# Patient Record
Sex: Female | Born: 1935
Health system: Southern US, Community
[De-identification: ages and names within clinical notes are randomized; demographics above are authoritative.]

## PROBLEM LIST (undated history)

## (undated) DIAGNOSIS — I4510 Unspecified right bundle-branch block: Secondary | ICD-10-CM

## (undated) DIAGNOSIS — R42 Dizziness and giddiness: Secondary | ICD-10-CM

## (undated) DIAGNOSIS — R7989 Other specified abnormal findings of blood chemistry: Secondary | ICD-10-CM

## (undated) DIAGNOSIS — E860 Dehydration: Secondary | ICD-10-CM

## (undated) DIAGNOSIS — I10 Essential (primary) hypertension: Secondary | ICD-10-CM

## (undated) DIAGNOSIS — H409 Unspecified glaucoma: Secondary | ICD-10-CM

## (undated) DIAGNOSIS — E785 Hyperlipidemia, unspecified: Secondary | ICD-10-CM

## (undated) DIAGNOSIS — F338 Other recurrent depressive disorders: Secondary | ICD-10-CM

## (undated) DIAGNOSIS — I2699 Other pulmonary embolism without acute cor pulmonale: Secondary | ICD-10-CM

## (undated) HISTORY — DX: Unspecified right bundle-branch block: I45.10

## (undated) HISTORY — DX: Dehydration: E86.0

## (undated) HISTORY — PX: CATARACT EXTRACTION: SUR2

## (undated) HISTORY — DX: Other recurrent depressive disorders: F33.8

## (undated) HISTORY — DX: Essential (primary) hypertension: I10

## (undated) HISTORY — DX: Dizziness and giddiness: R42

## (undated) HISTORY — PX: ABDOMINAL HYSTERECTOMY: SHX81

## (undated) HISTORY — DX: Other specified abnormal findings of blood chemistry: R79.89

## (undated) HISTORY — DX: Unspecified glaucoma: H40.9

## (undated) HISTORY — DX: Hyperlipidemia, unspecified: E78.5

---

## 1997-09-12 ENCOUNTER — Other Ambulatory Visit: Admission: RE | Admit: 1997-09-12 | Discharge: 1997-09-12 | Payer: Self-pay | Admitting: Gynecology

## 1998-09-07 ENCOUNTER — Other Ambulatory Visit: Admission: RE | Admit: 1998-09-07 | Discharge: 1998-09-07 | Payer: Self-pay | Admitting: Internal Medicine

## 1999-09-11 ENCOUNTER — Encounter: Admission: RE | Admit: 1999-09-11 | Discharge: 1999-09-11 | Payer: Self-pay | Admitting: Internal Medicine

## 1999-09-11 ENCOUNTER — Encounter: Payer: Self-pay | Admitting: Internal Medicine

## 2000-09-11 ENCOUNTER — Encounter: Admission: RE | Admit: 2000-09-11 | Discharge: 2000-09-11 | Payer: Self-pay | Admitting: Internal Medicine

## 2000-09-11 ENCOUNTER — Encounter: Payer: Self-pay | Admitting: Internal Medicine

## 2001-09-09 ENCOUNTER — Other Ambulatory Visit: Admission: RE | Admit: 2001-09-09 | Discharge: 2001-09-09 | Payer: Self-pay | Admitting: Internal Medicine

## 2001-09-14 ENCOUNTER — Encounter: Admission: RE | Admit: 2001-09-14 | Discharge: 2001-09-14 | Payer: Self-pay | Admitting: Internal Medicine

## 2001-09-14 ENCOUNTER — Encounter: Payer: Self-pay | Admitting: Internal Medicine

## 2002-09-15 ENCOUNTER — Encounter: Payer: Self-pay | Admitting: Internal Medicine

## 2002-09-15 ENCOUNTER — Encounter: Admission: RE | Admit: 2002-09-15 | Discharge: 2002-09-15 | Payer: Self-pay | Admitting: Internal Medicine

## 2003-06-27 ENCOUNTER — Ambulatory Visit (HOSPITAL_COMMUNITY): Admission: RE | Admit: 2003-06-27 | Discharge: 2003-06-27 | Payer: Self-pay | Admitting: Gastroenterology

## 2003-09-27 ENCOUNTER — Encounter: Admission: RE | Admit: 2003-09-27 | Discharge: 2003-09-27 | Payer: Self-pay | Admitting: Internal Medicine

## 2004-09-27 ENCOUNTER — Encounter: Admission: RE | Admit: 2004-09-27 | Discharge: 2004-09-27 | Payer: Self-pay | Admitting: Internal Medicine

## 2005-10-03 ENCOUNTER — Encounter: Admission: RE | Admit: 2005-10-03 | Discharge: 2005-10-03 | Payer: Self-pay | Admitting: Internal Medicine

## 2006-10-06 ENCOUNTER — Encounter: Admission: RE | Admit: 2006-10-06 | Discharge: 2006-10-06 | Payer: Self-pay | Admitting: *Deleted

## 2007-10-13 ENCOUNTER — Encounter: Admission: RE | Admit: 2007-10-13 | Discharge: 2007-10-13 | Payer: Self-pay | Admitting: Family Medicine

## 2008-10-25 ENCOUNTER — Encounter: Admission: RE | Admit: 2008-10-25 | Discharge: 2008-10-25 | Payer: Self-pay | Admitting: Family Medicine

## 2010-08-03 NOTE — Op Note (Signed)
NAME:  April Douglas, April Douglas                           ACCOUNT NO.:  [id number]   MEDICAL RECORD NO.:  [id number]                   PATIENT TYPE:  AMB   LOCATION:  ENDO                                 FACILITY:  [facility]   PHYSICIAN:  Danise Edge, M.D.                DATE OF BIRTH:  [birth year]   DATE OF PROCEDURE:  06/27/2003  DATE OF DISCHARGE:                                 OPERATIVE REPORT   PROCEDURE PERFORMED:  Screening colonoscopy.   PROCEDURE INDICATION:  Ms. Elanie Hammitt is a 77 year old female born  [birth year].  Ms. Lingelbach is scheduled to undergo her first screening  colonoscopy with polypectomy to prevent colon cancer.  Her health  maintenance flexible proctosigmoidoscopy performed in 1999 was normal.   ENDOSCOPIST:  Danise Edge, M.D.   PREMEDICATION:  1. Versed 7 mg.  2. Demerol 70 mg.   PROCEDURE:  After obtaining informed consent, Ms. Sibrian was placed in the  left lateral decubitus position.  I administered intravenous Demerol and  intravenous Versed to achieve conscious sedation for the procedure.  The  patient's blood pressure, oxygen saturation and cardiac rhythm were  monitored throughout the procedure and documented in the medical record.   Anal inspection and digital rectal exam were normal.  The Olympus adjustable  pediatric colonoscope was introduced into the rectum and advanced to the  cecum.  Colonic preparation for the exam today was excellent.   Rectum normal.   Sigmoid colon and descending colon normal.   Splenic flexure normal.   Transverse colon normal.   Hepatic flexure normal.   Ascending colon normal.   Cecum and ileocecal valve normal.   ASSESSMENT:  Normal screening proctocolonoscopy to the cecum.  No endoscopic  evidence for the presence of colorectal neoplasia.                                               Danise Edge, M.D.    MJ/MEDQ  D:  06/27/2003  T:  06/27/2003  Job:  [id number]   cc:   Darius Bump, M.D.  [address]  [place] [address]  Fax: [phone]

## 2011-06-27 DIAGNOSIS — D3100 Benign neoplasm of unspecified conjunctiva: Secondary | ICD-10-CM | POA: Diagnosis not present

## 2011-06-27 DIAGNOSIS — H35039 Hypertensive retinopathy, unspecified eye: Secondary | ICD-10-CM | POA: Diagnosis not present

## 2011-06-27 DIAGNOSIS — Z961 Presence of intraocular lens: Secondary | ICD-10-CM | POA: Diagnosis not present

## 2011-06-27 DIAGNOSIS — H40029 Open angle with borderline findings, high risk, unspecified eye: Secondary | ICD-10-CM | POA: Diagnosis not present

## 2011-06-27 DIAGNOSIS — H11159 Pinguecula, unspecified eye: Secondary | ICD-10-CM | POA: Diagnosis not present

## 2011-07-04 DIAGNOSIS — R319 Hematuria, unspecified: Secondary | ICD-10-CM | POA: Diagnosis not present

## 2011-07-04 DIAGNOSIS — N390 Urinary tract infection, site not specified: Secondary | ICD-10-CM | POA: Diagnosis not present

## 2011-09-13 ENCOUNTER — Other Ambulatory Visit: Payer: Self-pay | Admitting: Family Medicine

## 2011-09-13 DIAGNOSIS — Z1231 Encounter for screening mammogram for malignant neoplasm of breast: Secondary | ICD-10-CM

## 2011-10-08 ENCOUNTER — Ambulatory Visit
Admission: RE | Admit: 2011-10-08 | Discharge: 2011-10-08 | Disposition: A | Discharge status: Home / Self Care | Payer: Medicare Other | Source: Ambulatory Visit | Attending: Family Medicine | Admitting: Family Medicine

## 2011-10-08 DIAGNOSIS — Z1231 Encounter for screening mammogram for malignant neoplasm of breast: Secondary | ICD-10-CM | POA: Diagnosis not present

## 2011-10-15 DIAGNOSIS — R319 Hematuria, unspecified: Secondary | ICD-10-CM | POA: Diagnosis not present

## 2011-11-13 DIAGNOSIS — H04129 Dry eye syndrome of unspecified lacrimal gland: Secondary | ICD-10-CM | POA: Diagnosis not present

## 2011-11-13 DIAGNOSIS — H1045 Other chronic allergic conjunctivitis: Secondary | ICD-10-CM | POA: Diagnosis not present

## 2011-11-13 DIAGNOSIS — H40019 Open angle with borderline findings, low risk, unspecified eye: Secondary | ICD-10-CM | POA: Diagnosis not present

## 2011-12-07 DIAGNOSIS — J019 Acute sinusitis, unspecified: Secondary | ICD-10-CM | POA: Diagnosis not present

## 2011-12-12 DIAGNOSIS — R059 Cough, unspecified: Secondary | ICD-10-CM | POA: Diagnosis not present

## 2011-12-12 DIAGNOSIS — R05 Cough: Secondary | ICD-10-CM | POA: Diagnosis not present

## 2011-12-12 DIAGNOSIS — J329 Chronic sinusitis, unspecified: Secondary | ICD-10-CM | POA: Diagnosis not present

## 2012-01-31 DIAGNOSIS — I10 Essential (primary) hypertension: Secondary | ICD-10-CM | POA: Diagnosis not present

## 2012-01-31 DIAGNOSIS — E039 Hypothyroidism, unspecified: Secondary | ICD-10-CM | POA: Diagnosis not present

## 2012-01-31 DIAGNOSIS — R82998 Other abnormal findings in urine: Secondary | ICD-10-CM | POA: Diagnosis not present

## 2012-01-31 DIAGNOSIS — Z79899 Other long term (current) drug therapy: Secondary | ICD-10-CM | POA: Diagnosis not present

## 2012-01-31 DIAGNOSIS — E782 Mixed hyperlipidemia: Secondary | ICD-10-CM | POA: Diagnosis not present

## 2012-02-04 ENCOUNTER — Other Ambulatory Visit: Payer: Self-pay | Admitting: Family Medicine

## 2012-02-04 DIAGNOSIS — Z23 Encounter for immunization: Secondary | ICD-10-CM | POA: Diagnosis not present

## 2012-02-04 DIAGNOSIS — Z1231 Encounter for screening mammogram for malignant neoplasm of breast: Secondary | ICD-10-CM

## 2012-02-04 DIAGNOSIS — E039 Hypothyroidism, unspecified: Secondary | ICD-10-CM | POA: Diagnosis not present

## 2012-02-04 DIAGNOSIS — I10 Essential (primary) hypertension: Secondary | ICD-10-CM | POA: Diagnosis not present

## 2012-02-04 DIAGNOSIS — Z780 Asymptomatic menopausal state: Secondary | ICD-10-CM

## 2012-02-04 DIAGNOSIS — R319 Hematuria, unspecified: Secondary | ICD-10-CM | POA: Diagnosis not present

## 2012-02-04 DIAGNOSIS — E782 Mixed hyperlipidemia: Secondary | ICD-10-CM | POA: Diagnosis not present

## 2012-10-13 ENCOUNTER — Other Ambulatory Visit: Payer: Self-pay

## 2012-10-13 DIAGNOSIS — Z1231 Encounter for screening mammogram for malignant neoplasm of breast: Secondary | ICD-10-CM

## 2012-10-30 ENCOUNTER — Ambulatory Visit: Payer: Medicare Other

## 2012-12-29 ENCOUNTER — Ambulatory Visit
Admission: RE | Admit: 2012-12-29 | Discharge: 2012-12-29 | Disposition: A | Discharge status: Home / Self Care | Payer: 59 | Source: Ambulatory Visit

## 2012-12-29 DIAGNOSIS — Z1231 Encounter for screening mammogram for malignant neoplasm of breast: Secondary | ICD-10-CM

## 2013-02-01 ENCOUNTER — Other Ambulatory Visit: Payer: Self-pay | Admitting: Family Medicine

## 2013-02-01 DIAGNOSIS — Z1231 Encounter for screening mammogram for malignant neoplasm of breast: Secondary | ICD-10-CM

## 2013-02-01 DIAGNOSIS — Z780 Asymptomatic menopausal state: Secondary | ICD-10-CM

## 2013-02-03 ENCOUNTER — Other Ambulatory Visit: Payer: Self-pay | Admitting: Family Medicine

## 2013-02-03 DIAGNOSIS — N959 Unspecified menopausal and perimenopausal disorder: Secondary | ICD-10-CM

## 2013-02-15 DIAGNOSIS — R82998 Other abnormal findings in urine: Secondary | ICD-10-CM | POA: Diagnosis not present

## 2013-03-05 DIAGNOSIS — R319 Hematuria, unspecified: Secondary | ICD-10-CM | POA: Diagnosis not present

## 2013-03-05 DIAGNOSIS — Z87440 Personal history of urinary (tract) infections: Secondary | ICD-10-CM | POA: Diagnosis not present

## 2013-05-31 DIAGNOSIS — N3090 Cystitis, unspecified without hematuria: Secondary | ICD-10-CM | POA: Diagnosis not present

## 2013-05-31 DIAGNOSIS — R3989 Other symptoms and signs involving the genitourinary system: Secondary | ICD-10-CM | POA: Diagnosis not present

## 2013-06-09 DIAGNOSIS — L819 Disorder of pigmentation, unspecified: Secondary | ICD-10-CM | POA: Diagnosis not present

## 2013-06-16 DIAGNOSIS — H40029 Open angle with borderline findings, high risk, unspecified eye: Secondary | ICD-10-CM | POA: Diagnosis not present

## 2013-07-19 DIAGNOSIS — H35039 Hypertensive retinopathy, unspecified eye: Secondary | ICD-10-CM | POA: Diagnosis not present

## 2013-07-19 DIAGNOSIS — H524 Presbyopia: Secondary | ICD-10-CM | POA: Diagnosis not present

## 2013-07-19 DIAGNOSIS — H40029 Open angle with borderline findings, high risk, unspecified eye: Secondary | ICD-10-CM | POA: Diagnosis not present

## 2013-07-19 DIAGNOSIS — H04129 Dry eye syndrome of unspecified lacrimal gland: Secondary | ICD-10-CM | POA: Diagnosis not present

## 2013-07-19 DIAGNOSIS — H1045 Other chronic allergic conjunctivitis: Secondary | ICD-10-CM | POA: Diagnosis not present

## 2013-10-12 DIAGNOSIS — I10 Essential (primary) hypertension: Secondary | ICD-10-CM | POA: Diagnosis not present

## 2013-10-12 DIAGNOSIS — E041 Nontoxic single thyroid nodule: Secondary | ICD-10-CM | POA: Diagnosis not present

## 2013-10-12 DIAGNOSIS — J309 Allergic rhinitis, unspecified: Secondary | ICD-10-CM | POA: Diagnosis not present

## 2013-10-12 DIAGNOSIS — E782 Mixed hyperlipidemia: Secondary | ICD-10-CM | POA: Diagnosis not present

## 2013-10-12 DIAGNOSIS — Z79899 Other long term (current) drug therapy: Secondary | ICD-10-CM | POA: Diagnosis not present

## 2013-10-12 DIAGNOSIS — E871 Hypo-osmolality and hyponatremia: Secondary | ICD-10-CM | POA: Diagnosis not present

## 2013-10-14 DIAGNOSIS — E871 Hypo-osmolality and hyponatremia: Secondary | ICD-10-CM | POA: Diagnosis not present

## 2013-10-14 DIAGNOSIS — E041 Nontoxic single thyroid nodule: Secondary | ICD-10-CM | POA: Diagnosis not present

## 2013-10-14 DIAGNOSIS — I10 Essential (primary) hypertension: Secondary | ICD-10-CM | POA: Diagnosis not present

## 2013-10-14 DIAGNOSIS — Z79899 Other long term (current) drug therapy: Secondary | ICD-10-CM | POA: Diagnosis not present

## 2013-10-14 DIAGNOSIS — E782 Mixed hyperlipidemia: Secondary | ICD-10-CM | POA: Diagnosis not present

## 2013-11-04 DIAGNOSIS — E871 Hypo-osmolality and hyponatremia: Secondary | ICD-10-CM | POA: Diagnosis not present

## 2013-12-10 DIAGNOSIS — L819 Disorder of pigmentation, unspecified: Secondary | ICD-10-CM | POA: Diagnosis not present

## 2013-12-16 DIAGNOSIS — Z23 Encounter for immunization: Secondary | ICD-10-CM | POA: Diagnosis not present

## 2013-12-16 DIAGNOSIS — K061 Gingival enlargement: Secondary | ICD-10-CM | POA: Diagnosis not present

## 2013-12-16 DIAGNOSIS — H6120 Impacted cerumen, unspecified ear: Secondary | ICD-10-CM | POA: Diagnosis not present

## 2013-12-16 DIAGNOSIS — I10 Essential (primary) hypertension: Secondary | ICD-10-CM | POA: Diagnosis not present

## 2013-12-27 DIAGNOSIS — I10 Essential (primary) hypertension: Secondary | ICD-10-CM | POA: Diagnosis not present

## 2013-12-27 DIAGNOSIS — N390 Urinary tract infection, site not specified: Secondary | ICD-10-CM | POA: Diagnosis not present

## 2013-12-30 ENCOUNTER — Other Ambulatory Visit: Payer: Self-pay | Admitting: Family Medicine

## 2013-12-30 DIAGNOSIS — E2839 Other primary ovarian failure: Secondary | ICD-10-CM

## 2013-12-31 ENCOUNTER — Ambulatory Visit: Payer: 59

## 2013-12-31 ENCOUNTER — Other Ambulatory Visit: Payer: 59

## 2013-12-31 DIAGNOSIS — R319 Hematuria, unspecified: Secondary | ICD-10-CM | POA: Diagnosis not present

## 2013-12-31 DIAGNOSIS — I10 Essential (primary) hypertension: Secondary | ICD-10-CM | POA: Diagnosis not present

## 2013-12-31 DIAGNOSIS — N390 Urinary tract infection, site not specified: Secondary | ICD-10-CM | POA: Diagnosis not present

## 2014-01-11 DIAGNOSIS — I10 Essential (primary) hypertension: Secondary | ICD-10-CM | POA: Diagnosis not present

## 2014-01-11 DIAGNOSIS — R319 Hematuria, unspecified: Secondary | ICD-10-CM | POA: Diagnosis not present

## 2014-01-11 DIAGNOSIS — N390 Urinary tract infection, site not specified: Secondary | ICD-10-CM | POA: Diagnosis not present

## 2014-01-17 ENCOUNTER — Ambulatory Visit
Admission: RE | Admit: 2014-01-17 | Discharge: 2014-01-17 | Disposition: A | Discharge status: Home / Self Care | Payer: BC Managed Care – PPO | Source: Ambulatory Visit | Attending: Family Medicine | Admitting: Family Medicine

## 2014-01-17 DIAGNOSIS — Z1231 Encounter for screening mammogram for malignant neoplasm of breast: Secondary | ICD-10-CM

## 2014-01-17 DIAGNOSIS — Z780 Asymptomatic menopausal state: Secondary | ICD-10-CM | POA: Diagnosis not present

## 2014-01-17 DIAGNOSIS — Z13820 Encounter for screening for osteoporosis: Secondary | ICD-10-CM | POA: Diagnosis not present

## 2014-01-17 DIAGNOSIS — E2839 Other primary ovarian failure: Secondary | ICD-10-CM

## 2014-01-22 DIAGNOSIS — J019 Acute sinusitis, unspecified: Secondary | ICD-10-CM | POA: Diagnosis not present

## 2014-01-23 ENCOUNTER — Encounter: Payer: Self-pay | Admitting: *Deleted

## 2014-02-01 DIAGNOSIS — I10 Essential (primary) hypertension: Secondary | ICD-10-CM | POA: Diagnosis not present

## 2014-02-01 DIAGNOSIS — E785 Hyperlipidemia, unspecified: Secondary | ICD-10-CM | POA: Diagnosis not present

## 2014-03-03 DIAGNOSIS — N359 Urethral stricture, unspecified: Secondary | ICD-10-CM | POA: Diagnosis not present

## 2014-03-03 DIAGNOSIS — R312 Other microscopic hematuria: Secondary | ICD-10-CM | POA: Diagnosis not present

## 2014-03-03 DIAGNOSIS — N952 Postmenopausal atrophic vaginitis: Secondary | ICD-10-CM | POA: Diagnosis not present

## 2014-03-03 DIAGNOSIS — Z87440 Personal history of urinary (tract) infections: Secondary | ICD-10-CM | POA: Diagnosis not present

## 2014-04-14 DIAGNOSIS — Z87448 Personal history of other diseases of urinary system: Secondary | ICD-10-CM | POA: Diagnosis not present

## 2014-04-14 DIAGNOSIS — N952 Postmenopausal atrophic vaginitis: Secondary | ICD-10-CM | POA: Diagnosis not present

## 2014-04-14 DIAGNOSIS — R312 Other microscopic hematuria: Secondary | ICD-10-CM | POA: Diagnosis not present

## 2014-04-19 ENCOUNTER — Other Ambulatory Visit: Payer: Self-pay | Admitting: Family Medicine

## 2014-04-19 DIAGNOSIS — E041 Nontoxic single thyroid nodule: Secondary | ICD-10-CM

## 2014-04-19 DIAGNOSIS — H6120 Impacted cerumen, unspecified ear: Secondary | ICD-10-CM | POA: Diagnosis not present

## 2014-04-19 DIAGNOSIS — Z79899 Other long term (current) drug therapy: Secondary | ICD-10-CM | POA: Diagnosis not present

## 2014-04-19 DIAGNOSIS — I10 Essential (primary) hypertension: Secondary | ICD-10-CM | POA: Diagnosis not present

## 2014-04-19 DIAGNOSIS — E782 Mixed hyperlipidemia: Secondary | ICD-10-CM | POA: Diagnosis not present

## 2014-04-22 ENCOUNTER — Ambulatory Visit
Admission: RE | Admit: 2014-04-22 | Discharge: 2014-04-22 | Disposition: A | Discharge status: Home / Self Care | Payer: Medicare Other | Source: Ambulatory Visit | Attending: Family Medicine | Admitting: Family Medicine

## 2014-04-22 DIAGNOSIS — E041 Nontoxic single thyroid nodule: Secondary | ICD-10-CM | POA: Diagnosis not present

## 2014-06-14 DIAGNOSIS — L811 Chloasma: Secondary | ICD-10-CM | POA: Diagnosis not present

## 2014-07-23 DIAGNOSIS — S92354A Nondisplaced fracture of fifth metatarsal bone, right foot, initial encounter for closed fracture: Secondary | ICD-10-CM | POA: Diagnosis not present

## 2014-08-01 DIAGNOSIS — Z6829 Body mass index (BMI) 29.0-29.9, adult: Secondary | ICD-10-CM | POA: Diagnosis not present

## 2014-08-01 DIAGNOSIS — I10 Essential (primary) hypertension: Secondary | ICD-10-CM | POA: Diagnosis not present

## 2014-08-02 DIAGNOSIS — S92354D Nondisplaced fracture of fifth metatarsal bone, right foot, subsequent encounter for fracture with routine healing: Secondary | ICD-10-CM | POA: Diagnosis not present

## 2014-08-23 DIAGNOSIS — S92354D Nondisplaced fracture of fifth metatarsal bone, right foot, subsequent encounter for fracture with routine healing: Secondary | ICD-10-CM | POA: Diagnosis not present

## 2014-08-31 DIAGNOSIS — I10 Essential (primary) hypertension: Secondary | ICD-10-CM | POA: Diagnosis not present

## 2014-09-08 DIAGNOSIS — S92354D Nondisplaced fracture of fifth metatarsal bone, right foot, subsequent encounter for fracture with routine healing: Secondary | ICD-10-CM | POA: Diagnosis not present

## 2014-10-17 DIAGNOSIS — Z79899 Other long term (current) drug therapy: Secondary | ICD-10-CM | POA: Diagnosis not present

## 2014-10-17 DIAGNOSIS — E782 Mixed hyperlipidemia: Secondary | ICD-10-CM | POA: Diagnosis not present

## 2014-10-17 DIAGNOSIS — I10 Essential (primary) hypertension: Secondary | ICD-10-CM | POA: Diagnosis not present

## 2014-10-18 DIAGNOSIS — K649 Unspecified hemorrhoids: Secondary | ICD-10-CM | POA: Diagnosis not present

## 2014-10-18 DIAGNOSIS — J069 Acute upper respiratory infection, unspecified: Secondary | ICD-10-CM | POA: Diagnosis not present

## 2014-10-18 DIAGNOSIS — E782 Mixed hyperlipidemia: Secondary | ICD-10-CM | POA: Diagnosis not present

## 2014-10-18 DIAGNOSIS — I10 Essential (primary) hypertension: Secondary | ICD-10-CM | POA: Diagnosis not present

## 2014-10-18 DIAGNOSIS — Z23 Encounter for immunization: Secondary | ICD-10-CM | POA: Diagnosis not present

## 2014-10-28 DIAGNOSIS — H35033 Hypertensive retinopathy, bilateral: Secondary | ICD-10-CM | POA: Diagnosis not present

## 2014-10-28 DIAGNOSIS — H1013 Acute atopic conjunctivitis, bilateral: Secondary | ICD-10-CM | POA: Diagnosis not present

## 2014-10-28 DIAGNOSIS — H40023 Open angle with borderline findings, high risk, bilateral: Secondary | ICD-10-CM | POA: Diagnosis not present

## 2014-10-28 DIAGNOSIS — H04123 Dry eye syndrome of bilateral lacrimal glands: Secondary | ICD-10-CM | POA: Diagnosis not present

## 2014-12-10 DIAGNOSIS — J069 Acute upper respiratory infection, unspecified: Secondary | ICD-10-CM | POA: Diagnosis not present

## 2014-12-13 DIAGNOSIS — L811 Chloasma: Secondary | ICD-10-CM | POA: Diagnosis not present

## 2014-12-15 ENCOUNTER — Other Ambulatory Visit: Payer: Self-pay | Admitting: Gastroenterology

## 2014-12-16 ENCOUNTER — Other Ambulatory Visit: Payer: Self-pay | Admitting: Gastroenterology

## 2014-12-22 ENCOUNTER — Other Ambulatory Visit: Payer: Self-pay

## 2014-12-22 DIAGNOSIS — Z1231 Encounter for screening mammogram for malignant neoplasm of breast: Secondary | ICD-10-CM

## 2015-01-20 ENCOUNTER — Ambulatory Visit
Admission: RE | Admit: 2015-01-20 | Discharge: 2015-01-20 | Disposition: A | Discharge status: Home / Self Care | Payer: Medicare Other | Source: Ambulatory Visit

## 2015-01-20 DIAGNOSIS — Z1231 Encounter for screening mammogram for malignant neoplasm of breast: Secondary | ICD-10-CM | POA: Diagnosis not present

## 2015-01-27 DIAGNOSIS — H40023 Open angle with borderline findings, high risk, bilateral: Secondary | ICD-10-CM | POA: Diagnosis not present

## 2015-02-01 ENCOUNTER — Encounter (HOSPITAL_COMMUNITY): Payer: Self-pay | Admitting: *Deleted

## 2015-02-01 DIAGNOSIS — I10 Essential (primary) hypertension: Secondary | ICD-10-CM | POA: Diagnosis not present

## 2015-02-05 NOTE — Anesthesia Preprocedure Evaluation (Addendum)
Anesthesia Evaluation  Patient identified by MRN, date of birth, ID band Patient awake    Reviewed: Allergy & Precautions, H&P , NPO status , Patient's Chart, lab work & pertinent test results  Airway Mallampati: II  TM Distance: >3 FB Neck ROM: Full    Dental no notable dental hx. (+) Teeth Intact, Dental Advisory Given   Pulmonary neg pulmonary ROS,    Pulmonary exam normal breath sounds clear to auscultation       Cardiovascular hypertension, Pt. on medications  Rhythm:Regular Rate:Normal     Neuro/Psych negative neurological ROS  negative psych ROS   GI/Hepatic negative GI ROS, Neg liver ROS,   Endo/Other  negative endocrine ROS  Renal/GU negative Renal ROS  negative genitourinary   Musculoskeletal   Abdominal   Peds  Hematology negative hematology ROS (+)   Anesthesia Other Findings   Reproductive/Obstetrics negative OB ROS                            Anesthesia Physical Anesthesia Plan  ASA: II  Anesthesia Plan: MAC   Post-op Pain Management:    Induction: Intravenous  Airway Management Planned: Simple Face Mask  Additional Equipment:   Intra-op Plan:   Post-operative Plan:   Informed Consent: I have reviewed the patients History and Physical, chart, labs and discussed the procedure including the risks, benefits and alternatives for the proposed anesthesia with the patient or authorized representative who has indicated his/her understanding and acceptance.   Dental advisory given  Plan Discussed with: CRNA  Anesthesia Plan Comments:         Anesthesia Quick Evaluation

## 2015-02-06 ENCOUNTER — Encounter (HOSPITAL_COMMUNITY): Payer: Self-pay | Admitting: *Deleted

## 2015-02-06 ENCOUNTER — Ambulatory Visit (HOSPITAL_COMMUNITY)
Admission: RE | Admit: 2015-02-06 | Discharge: 2015-02-06 | Disposition: A | Discharge status: Home / Self Care | Payer: Medicare Other | Source: Ambulatory Visit | Attending: Gastroenterology | Admitting: Gastroenterology

## 2015-02-06 ENCOUNTER — Encounter (HOSPITAL_COMMUNITY)
Admission: RE | Disposition: A | Discharge status: Home / Self Care | Payer: Self-pay | Source: Ambulatory Visit | Attending: Gastroenterology

## 2015-02-06 ENCOUNTER — Ambulatory Visit (HOSPITAL_COMMUNITY): Payer: Medicare Other | Admitting: Anesthesiology

## 2015-02-06 DIAGNOSIS — Z7982 Long term (current) use of aspirin: Secondary | ICD-10-CM | POA: Diagnosis not present

## 2015-02-06 DIAGNOSIS — I10 Essential (primary) hypertension: Secondary | ICD-10-CM | POA: Diagnosis not present

## 2015-02-06 DIAGNOSIS — Z8249 Family history of ischemic heart disease and other diseases of the circulatory system: Secondary | ICD-10-CM | POA: Insufficient documentation

## 2015-02-06 DIAGNOSIS — Z79899 Other long term (current) drug therapy: Secondary | ICD-10-CM | POA: Insufficient documentation

## 2015-02-06 DIAGNOSIS — Z90710 Acquired absence of both cervix and uterus: Secondary | ICD-10-CM | POA: Diagnosis not present

## 2015-02-06 DIAGNOSIS — K921 Melena: Secondary | ICD-10-CM | POA: Diagnosis not present

## 2015-02-06 DIAGNOSIS — E785 Hyperlipidemia, unspecified: Secondary | ICD-10-CM | POA: Insufficient documentation

## 2015-02-06 DIAGNOSIS — Z1211 Encounter for screening for malignant neoplasm of colon: Secondary | ICD-10-CM | POA: Diagnosis not present

## 2015-02-06 HISTORY — PX: COLONOSCOPY WITH PROPOFOL: SHX5780

## 2015-02-06 SURGERY — COLONOSCOPY WITH PROPOFOL
Anesthesia: Monitor Anesthesia Care

## 2015-02-06 MED ORDER — PROPOFOL 10 MG/ML IV BOLUS
INTRAVENOUS | Status: AC
  Filled 2015-02-06: qty 20

## 2015-02-06 MED ORDER — SODIUM CHLORIDE 0.9 % IV SOLN: INTRAVENOUS | Status: DC

## 2015-02-06 MED ORDER — LACTATED RINGERS IV SOLN
Freq: Once | INTRAVENOUS | Status: AC
  Administered 2015-02-06: 1000 mL via INTRAVENOUS

## 2015-02-06 MED ORDER — PROPOFOL 10 MG/ML IV BOLUS
INTRAVENOUS | Status: DC | PRN
  Administered 2015-02-06: 30 mg via INTRAVENOUS
  Administered 2015-02-06: 20 mg via INTRAVENOUS
  Administered 2015-02-06: 100 mg via INTRAVENOUS
  Administered 2015-02-06: 30 mg via INTRAVENOUS
  Administered 2015-02-06: 20 mg via INTRAVENOUS

## 2015-02-06 MED ORDER — LACTATED RINGERS IV SOLN
INTRAVENOUS | Status: DC | PRN
  Administered 2015-02-06: 12:00:00 via INTRAVENOUS

## 2015-02-06 SURGICAL SUPPLY — 22 items

## 2015-02-06 NOTE — Anesthesia Postprocedure Evaluation (Signed)
Anesthesia Post Note  Patient: April Douglas  Procedure(s) Performed: Procedure(s) (LRB): COLONOSCOPY WITH PROPOFOL (N/A)  Patient location during evaluation: PACU Anesthesia Type: MAC Level of consciousness: awake and alert Pain management: pain level controlled Vital Signs Assessment: post-procedure vital signs reviewed and stable Respiratory status: spontaneous breathing, nonlabored ventilation and respiratory function stable Cardiovascular status: stable and blood pressure returned to baseline Anesthetic complications: no    Last Vitals:  Filed Vitals:   02/06/15 1310 02/06/15 1320  BP: 143/97 155/94  Pulse: 47 48  Temp:    Resp: 14 12    Last Pain: There were no vitals filed for this visit.               Fredia Chittenden,W. EDMOND

## 2015-02-06 NOTE — Discharge Instructions (Signed)

## 2015-02-06 NOTE — Op Note (Signed)
Procedure: Diagnostic colonoscopy to evaluate hematochezia. Normal screening colonoscopy performed on 06/27/2003  Endoscopist: Earle Gell  Premedication: Propofol administered by anesthesia  Procedure: The patient was placed in the left lateral decubitus position. Anal inspection and digital rectal exam were normal. The Pentax pediatric colonoscope was introduced into the rectum and advanced to the cecum. A normal-appearing appendiceal orifice and ileocecal valve were identified. Colonic preparation for the exam today was good. Withdrawal time was 8 minutes  Rectum. Normal. Retroflexed view of the distal rectum was normal  Sigmoid colon and descending colon. Normal  Splenic flexure. Normal  Transverse colon. Normal  Hepatic flexure. Normal  Ascending colon. Normal  Cecum and ileocecal valve. Normal  Assessment: Normal colonoscopy.

## 2015-02-06 NOTE — H&P (Signed)
Procedure: Diagnostic colonoscopy to evaluate hematochezia. Normal screening colonoscopy performed on 06/27/2003  History: The patient is a 81 year old female born in [birth year]. Intermittently, she passes fresh blood on the toilet tissue following passage of a hard bowel movement. Otherwise her bowel movements are normal.  She is scheduled to undergo diagnostic colonoscopy.  Past medical history: Hypertension. Glaucoma. Hypercholesterolemia. Total abdominal hysterectomy. Bilateral salpingo-oophorectomy. Cataract surgery. Tubal pregnancy.  Exam: The patient is alert and lying comfortably on the endoscopy stretcher. Abdomen is soft and nontender to palpation. Lungs are clear to auscultation. Cardiac exam reveals a regular rhythm.  Plan: Proceed with diagnostic colonoscopy

## 2015-02-06 NOTE — Transfer of Care (Signed)
Immediate Anesthesia Transfer of Care Note  Patient: FAIZAH KLINGSHIRN  Procedure(s) Performed: Procedure(s): COLONOSCOPY WITH PROPOFOL (N/A)  Patient Location: PACU  Anesthesia Type:MAC  Level of Consciousness: awake, alert  and oriented  Airway & Oxygen Therapy: Patient Spontanous Breathing and Patient connected to face mask oxygen  Post-op Assessment: Report given to RN and Post -op Vital signs reviewed and stable  Post vital signs: Reviewed and stable  Last Vitals:  Filed Vitals:   02/06/15 1128  BP: 203/99  Temp: 36.8 C  Resp: 13    Complications: No apparent anesthesia complications

## 2015-02-07 ENCOUNTER — Encounter (HOSPITAL_COMMUNITY): Payer: Self-pay | Admitting: Gastroenterology

## 2015-05-03 DIAGNOSIS — N3000 Acute cystitis without hematuria: Secondary | ICD-10-CM | POA: Diagnosis not present

## 2015-05-03 DIAGNOSIS — R350 Frequency of micturition: Secondary | ICD-10-CM | POA: Diagnosis not present

## 2015-05-18 DIAGNOSIS — I10 Essential (primary) hypertension: Secondary | ICD-10-CM | POA: Diagnosis not present

## 2015-05-18 DIAGNOSIS — N390 Urinary tract infection, site not specified: Secondary | ICD-10-CM | POA: Diagnosis not present

## 2015-05-18 DIAGNOSIS — E782 Mixed hyperlipidemia: Secondary | ICD-10-CM | POA: Diagnosis not present

## 2015-05-18 DIAGNOSIS — Z79899 Other long term (current) drug therapy: Secondary | ICD-10-CM | POA: Diagnosis not present

## 2015-05-18 DIAGNOSIS — H6120 Impacted cerumen, unspecified ear: Secondary | ICD-10-CM | POA: Diagnosis not present

## 2015-05-22 DIAGNOSIS — J309 Allergic rhinitis, unspecified: Secondary | ICD-10-CM | POA: Diagnosis not present

## 2015-06-14 DIAGNOSIS — L811 Chloasma: Secondary | ICD-10-CM | POA: Diagnosis not present

## 2015-06-19 DIAGNOSIS — M549 Dorsalgia, unspecified: Secondary | ICD-10-CM | POA: Diagnosis not present

## 2015-06-19 DIAGNOSIS — R8290 Unspecified abnormal findings in urine: Secondary | ICD-10-CM | POA: Diagnosis not present

## 2015-06-29 DIAGNOSIS — R319 Hematuria, unspecified: Secondary | ICD-10-CM | POA: Diagnosis not present

## 2015-07-21 ENCOUNTER — Other Ambulatory Visit: Payer: Self-pay | Admitting: Family Medicine

## 2015-07-21 DIAGNOSIS — M549 Dorsalgia, unspecified: Secondary | ICD-10-CM

## 2015-07-31 DIAGNOSIS — I10 Essential (primary) hypertension: Secondary | ICD-10-CM | POA: Diagnosis not present

## 2015-08-03 ENCOUNTER — Ambulatory Visit
Admission: RE | Admit: 2015-08-03 | Discharge: 2015-08-03 | Disposition: A | Discharge status: Home / Self Care | Payer: Medicare Other | Source: Ambulatory Visit | Attending: Family Medicine | Admitting: Family Medicine

## 2015-08-03 DIAGNOSIS — N281 Cyst of kidney, acquired: Secondary | ICD-10-CM | POA: Diagnosis not present

## 2015-08-03 DIAGNOSIS — M549 Dorsalgia, unspecified: Secondary | ICD-10-CM

## 2015-10-30 DIAGNOSIS — J0100 Acute maxillary sinusitis, unspecified: Secondary | ICD-10-CM | POA: Diagnosis not present

## 2015-11-02 DIAGNOSIS — H04123 Dry eye syndrome of bilateral lacrimal glands: Secondary | ICD-10-CM | POA: Diagnosis not present

## 2015-11-02 DIAGNOSIS — Z961 Presence of intraocular lens: Secondary | ICD-10-CM | POA: Diagnosis not present

## 2015-11-02 DIAGNOSIS — H35033 Hypertensive retinopathy, bilateral: Secondary | ICD-10-CM | POA: Diagnosis not present

## 2015-11-02 DIAGNOSIS — H40023 Open angle with borderline findings, high risk, bilateral: Secondary | ICD-10-CM | POA: Diagnosis not present

## 2015-11-06 DIAGNOSIS — J019 Acute sinusitis, unspecified: Secondary | ICD-10-CM | POA: Diagnosis not present

## 2015-11-06 DIAGNOSIS — B9689 Other specified bacterial agents as the cause of diseases classified elsewhere: Secondary | ICD-10-CM | POA: Diagnosis not present

## 2015-11-14 DIAGNOSIS — J209 Acute bronchitis, unspecified: Secondary | ICD-10-CM | POA: Diagnosis not present

## 2015-11-15 ENCOUNTER — Other Ambulatory Visit: Payer: Self-pay | Admitting: Family Medicine

## 2015-11-15 ENCOUNTER — Ambulatory Visit
Admission: RE | Admit: 2015-11-15 | Discharge: 2015-11-15 | Disposition: A | Discharge status: Home / Self Care | Payer: Medicare Other | Source: Ambulatory Visit | Attending: Family Medicine | Admitting: Family Medicine

## 2015-11-15 DIAGNOSIS — R059 Cough, unspecified: Secondary | ICD-10-CM

## 2015-11-15 DIAGNOSIS — R05 Cough: Secondary | ICD-10-CM | POA: Diagnosis not present

## 2015-11-21 DIAGNOSIS — I10 Essential (primary) hypertension: Secondary | ICD-10-CM | POA: Diagnosis not present

## 2015-11-21 DIAGNOSIS — E782 Mixed hyperlipidemia: Secondary | ICD-10-CM | POA: Diagnosis not present

## 2015-11-21 DIAGNOSIS — Z79899 Other long term (current) drug therapy: Secondary | ICD-10-CM | POA: Diagnosis not present

## 2015-11-23 DIAGNOSIS — I10 Essential (primary) hypertension: Secondary | ICD-10-CM | POA: Diagnosis not present

## 2015-11-23 DIAGNOSIS — Z23 Encounter for immunization: Secondary | ICD-10-CM | POA: Diagnosis not present

## 2015-11-23 DIAGNOSIS — E782 Mixed hyperlipidemia: Secondary | ICD-10-CM | POA: Diagnosis not present

## 2016-01-03 ENCOUNTER — Other Ambulatory Visit: Payer: Self-pay | Admitting: Family Medicine

## 2016-01-03 DIAGNOSIS — Z1231 Encounter for screening mammogram for malignant neoplasm of breast: Secondary | ICD-10-CM

## 2016-01-23 ENCOUNTER — Ambulatory Visit: Payer: Medicare Other

## 2016-02-06 DIAGNOSIS — I10 Essential (primary) hypertension: Secondary | ICD-10-CM | POA: Diagnosis not present

## 2016-02-16 ENCOUNTER — Other Ambulatory Visit: Payer: Self-pay | Admitting: Family

## 2016-02-16 ENCOUNTER — Ambulatory Visit
Admission: RE | Admit: 2016-02-16 | Discharge: 2016-02-16 | Disposition: A | Discharge status: Home / Self Care | Payer: Medicare Other | Source: Ambulatory Visit | Attending: Family Medicine | Admitting: Family Medicine

## 2016-02-16 DIAGNOSIS — Z1231 Encounter for screening mammogram for malignant neoplasm of breast: Secondary | ICD-10-CM | POA: Diagnosis not present

## 2016-05-28 DIAGNOSIS — R3129 Other microscopic hematuria: Secondary | ICD-10-CM | POA: Diagnosis not present

## 2016-05-28 DIAGNOSIS — I10 Essential (primary) hypertension: Secondary | ICD-10-CM | POA: Diagnosis not present

## 2016-05-28 DIAGNOSIS — J309 Allergic rhinitis, unspecified: Secondary | ICD-10-CM | POA: Diagnosis not present

## 2016-05-28 DIAGNOSIS — E871 Hypo-osmolality and hyponatremia: Secondary | ICD-10-CM | POA: Diagnosis not present

## 2016-07-01 DIAGNOSIS — K1230 Oral mucositis (ulcerative), unspecified: Secondary | ICD-10-CM | POA: Diagnosis not present

## 2016-07-02 DIAGNOSIS — R944 Abnormal results of kidney function studies: Secondary | ICD-10-CM | POA: Diagnosis not present

## 2016-07-05 DIAGNOSIS — K1379 Other lesions of oral mucosa: Secondary | ICD-10-CM | POA: Diagnosis not present

## 2016-07-11 DIAGNOSIS — K1379 Other lesions of oral mucosa: Secondary | ICD-10-CM | POA: Diagnosis not present

## 2016-07-12 DIAGNOSIS — D2230 Melanocytic nevi of unspecified part of face: Secondary | ICD-10-CM | POA: Diagnosis not present

## 2016-07-12 DIAGNOSIS — L821 Other seborrheic keratosis: Secondary | ICD-10-CM | POA: Diagnosis not present

## 2016-07-12 DIAGNOSIS — L811 Chloasma: Secondary | ICD-10-CM | POA: Diagnosis not present

## 2016-08-05 DIAGNOSIS — I10 Essential (primary) hypertension: Secondary | ICD-10-CM | POA: Diagnosis not present

## 2016-11-21 DIAGNOSIS — I4510 Unspecified right bundle-branch block: Secondary | ICD-10-CM | POA: Diagnosis not present

## 2016-11-21 DIAGNOSIS — E782 Mixed hyperlipidemia: Secondary | ICD-10-CM | POA: Diagnosis not present

## 2016-11-21 DIAGNOSIS — I10 Essential (primary) hypertension: Secondary | ICD-10-CM | POA: Diagnosis not present

## 2016-11-21 DIAGNOSIS — Z23 Encounter for immunization: Secondary | ICD-10-CM | POA: Diagnosis not present

## 2016-11-27 DIAGNOSIS — E782 Mixed hyperlipidemia: Secondary | ICD-10-CM | POA: Diagnosis not present

## 2016-11-27 DIAGNOSIS — I4510 Unspecified right bundle-branch block: Secondary | ICD-10-CM | POA: Diagnosis not present

## 2016-11-27 DIAGNOSIS — Z23 Encounter for immunization: Secondary | ICD-10-CM | POA: Diagnosis not present

## 2016-11-27 DIAGNOSIS — I10 Essential (primary) hypertension: Secondary | ICD-10-CM | POA: Diagnosis not present

## 2016-12-16 DIAGNOSIS — H40023 Open angle with borderline findings, high risk, bilateral: Secondary | ICD-10-CM | POA: Diagnosis not present

## 2016-12-16 DIAGNOSIS — H04123 Dry eye syndrome of bilateral lacrimal glands: Secondary | ICD-10-CM | POA: Diagnosis not present

## 2016-12-16 DIAGNOSIS — H1013 Acute atopic conjunctivitis, bilateral: Secondary | ICD-10-CM | POA: Diagnosis not present

## 2016-12-16 DIAGNOSIS — Z961 Presence of intraocular lens: Secondary | ICD-10-CM | POA: Diagnosis not present

## 2016-12-24 DIAGNOSIS — L811 Chloasma: Secondary | ICD-10-CM | POA: Diagnosis not present

## 2016-12-24 DIAGNOSIS — Z23 Encounter for immunization: Secondary | ICD-10-CM | POA: Diagnosis not present

## 2017-01-20 ENCOUNTER — Other Ambulatory Visit: Payer: Self-pay | Admitting: Family Medicine

## 2017-01-20 DIAGNOSIS — Z1231 Encounter for screening mammogram for malignant neoplasm of breast: Secondary | ICD-10-CM

## 2017-02-10 DIAGNOSIS — D72819 Decreased white blood cell count, unspecified: Secondary | ICD-10-CM | POA: Diagnosis not present

## 2017-02-11 DIAGNOSIS — I10 Essential (primary) hypertension: Secondary | ICD-10-CM | POA: Diagnosis not present

## 2017-02-11 DIAGNOSIS — Z0189 Encounter for other specified special examinations: Secondary | ICD-10-CM | POA: Diagnosis not present

## 2017-02-17 ENCOUNTER — Ambulatory Visit
Admission: RE | Admit: 2017-02-17 | Discharge: 2017-02-17 | Disposition: A | Discharge status: Home / Self Care | Payer: Medicare Other | Source: Ambulatory Visit | Attending: Family Medicine | Admitting: Family Medicine

## 2017-02-17 DIAGNOSIS — Z1231 Encounter for screening mammogram for malignant neoplasm of breast: Secondary | ICD-10-CM | POA: Diagnosis not present

## 2017-03-06 DIAGNOSIS — R7989 Other specified abnormal findings of blood chemistry: Secondary | ICD-10-CM | POA: Diagnosis not present

## 2017-03-19 DIAGNOSIS — N3000 Acute cystitis without hematuria: Secondary | ICD-10-CM | POA: Diagnosis not present

## 2017-03-19 DIAGNOSIS — N390 Urinary tract infection, site not specified: Secondary | ICD-10-CM | POA: Diagnosis not present

## 2017-05-16 DIAGNOSIS — E782 Mixed hyperlipidemia: Secondary | ICD-10-CM | POA: Diagnosis not present

## 2017-05-20 DIAGNOSIS — I10 Essential (primary) hypertension: Secondary | ICD-10-CM | POA: Diagnosis not present

## 2017-05-20 DIAGNOSIS — Z1389 Encounter for screening for other disorder: Secondary | ICD-10-CM | POA: Diagnosis not present

## 2017-05-20 DIAGNOSIS — Z0000 Encounter for general adult medical examination without abnormal findings: Secondary | ICD-10-CM | POA: Diagnosis not present

## 2017-05-20 DIAGNOSIS — E782 Mixed hyperlipidemia: Secondary | ICD-10-CM | POA: Diagnosis not present

## 2017-05-20 DIAGNOSIS — J309 Allergic rhinitis, unspecified: Secondary | ICD-10-CM | POA: Diagnosis not present

## 2017-06-24 DIAGNOSIS — H1013 Acute atopic conjunctivitis, bilateral: Secondary | ICD-10-CM | POA: Diagnosis not present

## 2017-06-24 DIAGNOSIS — H40023 Open angle with borderline findings, high risk, bilateral: Secondary | ICD-10-CM | POA: Diagnosis not present

## 2017-07-07 DIAGNOSIS — S61217A Laceration without foreign body of left little finger without damage to nail, initial encounter: Secondary | ICD-10-CM | POA: Diagnosis not present

## 2017-08-06 DIAGNOSIS — J309 Allergic rhinitis, unspecified: Secondary | ICD-10-CM | POA: Diagnosis not present

## 2017-08-06 DIAGNOSIS — H6122 Impacted cerumen, left ear: Secondary | ICD-10-CM | POA: Diagnosis not present

## 2017-08-06 DIAGNOSIS — J0100 Acute maxillary sinusitis, unspecified: Secondary | ICD-10-CM | POA: Diagnosis not present

## 2017-08-12 DIAGNOSIS — Z0189 Encounter for other specified special examinations: Secondary | ICD-10-CM | POA: Diagnosis not present

## 2017-08-12 DIAGNOSIS — E785 Hyperlipidemia, unspecified: Secondary | ICD-10-CM | POA: Diagnosis not present

## 2017-08-12 DIAGNOSIS — I10 Essential (primary) hypertension: Secondary | ICD-10-CM | POA: Diagnosis not present

## 2017-09-23 DIAGNOSIS — L299 Pruritus, unspecified: Secondary | ICD-10-CM | POA: Diagnosis not present

## 2017-09-23 DIAGNOSIS — E039 Hypothyroidism, unspecified: Secondary | ICD-10-CM | POA: Diagnosis not present

## 2017-12-11 DIAGNOSIS — Z23 Encounter for immunization: Secondary | ICD-10-CM | POA: Diagnosis not present

## 2018-01-04 DIAGNOSIS — R4182 Altered mental status, unspecified: Secondary | ICD-10-CM | POA: Diagnosis not present

## 2018-01-04 DIAGNOSIS — G919 Hydrocephalus, unspecified: Secondary | ICD-10-CM | POA: Diagnosis not present

## 2018-01-04 DIAGNOSIS — R930 Abnormal findings on diagnostic imaging of skull and head, not elsewhere classified: Secondary | ICD-10-CM | POA: Diagnosis not present

## 2018-01-04 DIAGNOSIS — R531 Weakness: Secondary | ICD-10-CM | POA: Diagnosis not present

## 2018-01-04 DIAGNOSIS — R079 Chest pain, unspecified: Secondary | ICD-10-CM | POA: Diagnosis not present

## 2018-01-04 DIAGNOSIS — I959 Hypotension, unspecified: Secondary | ICD-10-CM | POA: Diagnosis not present

## 2018-01-04 DIAGNOSIS — Z79899 Other long term (current) drug therapy: Secondary | ICD-10-CM | POA: Diagnosis not present

## 2018-01-04 DIAGNOSIS — G910 Communicating hydrocephalus: Secondary | ICD-10-CM | POA: Diagnosis not present

## 2018-01-04 DIAGNOSIS — R55 Syncope and collapse: Secondary | ICD-10-CM | POA: Diagnosis not present

## 2018-01-04 DIAGNOSIS — E871 Hypo-osmolality and hyponatremia: Secondary | ICD-10-CM | POA: Diagnosis not present

## 2018-01-04 DIAGNOSIS — E876 Hypokalemia: Secondary | ICD-10-CM | POA: Diagnosis not present

## 2018-01-04 DIAGNOSIS — I6522 Occlusion and stenosis of left carotid artery: Secondary | ICD-10-CM | POA: Diagnosis not present

## 2018-01-04 DIAGNOSIS — I10 Essential (primary) hypertension: Secondary | ICD-10-CM | POA: Diagnosis not present

## 2018-01-04 DIAGNOSIS — G930 Cerebral cysts: Secondary | ICD-10-CM | POA: Diagnosis not present

## 2018-01-05 DIAGNOSIS — E876 Hypokalemia: Secondary | ICD-10-CM | POA: Diagnosis not present

## 2018-01-05 DIAGNOSIS — R55 Syncope and collapse: Secondary | ICD-10-CM | POA: Diagnosis not present

## 2018-01-05 DIAGNOSIS — G930 Cerebral cysts: Secondary | ICD-10-CM | POA: Diagnosis not present

## 2018-01-05 DIAGNOSIS — G919 Hydrocephalus, unspecified: Secondary | ICD-10-CM | POA: Diagnosis not present

## 2018-01-05 DIAGNOSIS — R930 Abnormal findings on diagnostic imaging of skull and head, not elsewhere classified: Secondary | ICD-10-CM | POA: Diagnosis not present

## 2018-01-05 DIAGNOSIS — E871 Hypo-osmolality and hyponatremia: Secondary | ICD-10-CM | POA: Diagnosis not present

## 2018-01-05 DIAGNOSIS — I6522 Occlusion and stenosis of left carotid artery: Secondary | ICD-10-CM | POA: Diagnosis not present

## 2018-01-20 DIAGNOSIS — Z961 Presence of intraocular lens: Secondary | ICD-10-CM | POA: Diagnosis not present

## 2018-01-20 DIAGNOSIS — H1013 Acute atopic conjunctivitis, bilateral: Secondary | ICD-10-CM | POA: Diagnosis not present

## 2018-01-20 DIAGNOSIS — H35033 Hypertensive retinopathy, bilateral: Secondary | ICD-10-CM | POA: Diagnosis not present

## 2018-01-20 DIAGNOSIS — H40023 Open angle with borderline findings, high risk, bilateral: Secondary | ICD-10-CM | POA: Diagnosis not present

## 2018-01-21 DIAGNOSIS — G238 Other specified degenerative diseases of basal ganglia: Secondary | ICD-10-CM | POA: Diagnosis not present

## 2018-01-21 DIAGNOSIS — I10 Essential (primary) hypertension: Secondary | ICD-10-CM | POA: Diagnosis not present

## 2018-01-21 DIAGNOSIS — R4689 Other symptoms and signs involving appearance and behavior: Secondary | ICD-10-CM | POA: Diagnosis not present

## 2018-01-21 DIAGNOSIS — R4182 Altered mental status, unspecified: Secondary | ICD-10-CM | POA: Diagnosis not present

## 2018-01-21 DIAGNOSIS — I452 Bifascicular block: Secondary | ICD-10-CM | POA: Diagnosis not present

## 2018-01-21 DIAGNOSIS — F0391 Unspecified dementia with behavioral disturbance: Secondary | ICD-10-CM | POA: Diagnosis not present

## 2018-01-21 DIAGNOSIS — Z79899 Other long term (current) drug therapy: Secondary | ICD-10-CM | POA: Diagnosis not present

## 2018-01-21 DIAGNOSIS — G9389 Other specified disorders of brain: Secondary | ICD-10-CM | POA: Diagnosis not present

## 2018-01-21 DIAGNOSIS — G9340 Encephalopathy, unspecified: Secondary | ICD-10-CM | POA: Diagnosis not present

## 2018-01-21 DIAGNOSIS — I499 Cardiac arrhythmia, unspecified: Secondary | ICD-10-CM | POA: Diagnosis not present

## 2018-01-21 DIAGNOSIS — E079 Disorder of thyroid, unspecified: Secondary | ICD-10-CM | POA: Diagnosis not present

## 2018-01-27 DIAGNOSIS — R4182 Altered mental status, unspecified: Secondary | ICD-10-CM | POA: Diagnosis not present

## 2018-01-27 DIAGNOSIS — Z9189 Other specified personal risk factors, not elsewhere classified: Secondary | ICD-10-CM | POA: Diagnosis not present

## 2018-01-27 DIAGNOSIS — I10 Essential (primary) hypertension: Secondary | ICD-10-CM | POA: Diagnosis not present

## 2018-01-27 DIAGNOSIS — E878 Other disorders of electrolyte and fluid balance, not elsewhere classified: Secondary | ICD-10-CM | POA: Diagnosis not present

## 2018-01-27 DIAGNOSIS — Z79899 Other long term (current) drug therapy: Secondary | ICD-10-CM | POA: Diagnosis not present

## 2018-01-27 DIAGNOSIS — E079 Disorder of thyroid, unspecified: Secondary | ICD-10-CM | POA: Diagnosis not present

## 2018-01-27 DIAGNOSIS — R419 Unspecified symptoms and signs involving cognitive functions and awareness: Secondary | ICD-10-CM | POA: Diagnosis not present

## 2018-01-27 DIAGNOSIS — R7989 Other specified abnormal findings of blood chemistry: Secondary | ICD-10-CM | POA: Diagnosis not present

## 2018-01-27 DIAGNOSIS — E871 Hypo-osmolality and hyponatremia: Secondary | ICD-10-CM | POA: Diagnosis not present

## 2018-01-27 DIAGNOSIS — R930 Abnormal findings on diagnostic imaging of skull and head, not elsewhere classified: Secondary | ICD-10-CM | POA: Diagnosis not present

## 2018-01-28 ENCOUNTER — Encounter: Payer: Self-pay | Admitting: *Deleted

## 2018-01-30 ENCOUNTER — Ambulatory Visit: Payer: Medicare Other | Admitting: Diagnostic Neuroimaging

## 2018-01-30 ENCOUNTER — Telehealth: Payer: Self-pay | Admitting: *Deleted

## 2018-01-30 DIAGNOSIS — E871 Hypo-osmolality and hyponatremia: Secondary | ICD-10-CM | POA: Diagnosis not present

## 2018-01-30 NOTE — Telephone Encounter (Signed)
Patient was no show for new patient appointment today. 

## 2018-02-02 ENCOUNTER — Encounter: Payer: Self-pay | Admitting: Diagnostic Neuroimaging

## 2018-02-03 DIAGNOSIS — I10 Essential (primary) hypertension: Secondary | ICD-10-CM | POA: Diagnosis not present

## 2018-02-03 DIAGNOSIS — E785 Hyperlipidemia, unspecified: Secondary | ICD-10-CM | POA: Diagnosis not present

## 2018-02-03 DIAGNOSIS — R55 Syncope and collapse: Secondary | ICD-10-CM | POA: Diagnosis not present

## 2018-02-10 DIAGNOSIS — J019 Acute sinusitis, unspecified: Secondary | ICD-10-CM | POA: Diagnosis not present

## 2018-02-11 DIAGNOSIS — R55 Syncope and collapse: Secondary | ICD-10-CM | POA: Diagnosis not present

## 2018-02-17 DIAGNOSIS — E871 Hypo-osmolality and hyponatremia: Secondary | ICD-10-CM | POA: Diagnosis not present

## 2018-02-27 ENCOUNTER — Ambulatory Visit (INDEPENDENT_AMBULATORY_CARE_PROVIDER_SITE_OTHER): Payer: Medicare Other | Admitting: Diagnostic Neuroimaging

## 2018-02-27 ENCOUNTER — Encounter: Payer: Self-pay | Admitting: Diagnostic Neuroimaging

## 2018-02-27 VITALS — BP 151/79 | HR 68 | Ht 65.0 in | Wt 163.4 lb

## 2018-02-27 DIAGNOSIS — R413 Other amnesia: Secondary | ICD-10-CM

## 2018-02-27 NOTE — Progress Notes (Signed)
[facility]  PATIENT: April Douglas DOB: 1933/08/14  REFERRING CLINICIAN: Carlota Raspberry HISTORY FROM: patient, daughter, husband REASON FOR VISIT: New consult   HISTORICAL  CHIEF COMPLAINT:  Chief Complaint  Patient presents with  . New Patient (Initial Visit)    Rm 7, daughter, Shawna [place], HusbandJonny Ruiz  . Referred by Dr. Zachery Dauer    Abnormal MRI, NPH, mental status changes. 02/02/2018 in [place] cardiac/syncope event, Low Na, K.  Noted since event confusion.  Wearing cardiac event monitor.  Seeing [facility].     HISTORY OF PRESENT ILLNESS:   84 year old female here for evaluation of confusion and memory loss.  Patient was in normal state of health until September/October 2019.  She was independent with all ADLs.  She used to drive to [facility] on a weekly basis to spend time with grandson.  In early 18-Nov-2019patient sister passed away.  She then traveled to be with her son in [place].  She spent about 1 week there with no incident.  On 02-02-2018 patient went to the [place] airport to return to [place].  Apparently she collapsed and became unresponsive.  A bystander who was a nurse came to her aid and could not detect a pulse.  CPR was started.  She regained consciousness within a few minutes.  Apparently her blood pressure was found to be low (systolic blood pressure in the 80s to 90s).  She was taken to local hospital and found to have low sodium and potassium levels (sodium 126, potassium 2.8) and severely dehydrated.  Patient had other evaluation including MRI of the brain which demonstrated moderate ventriculomegaly and incidental 1.1 cm meningioma.  Patient was in the hospital for 1 day and then discharged home.  Over the next 1 to 2 weeks patient returned to baseline.  She was able to fly back to [place].  Patient was doing well until 01/20/2018 when she was tangential in her thought process and speech, irritable, agitated.  The next day patient was  taken to the emergency room.  Sodium was 137 and potassium 4.5.  No acute findings on CT of the head.  Patient was discharged home.  Patient continued to have fluctuating symptoms over the next few days.  On 01/27/2017 patient at the PCP, had lab testing where sodium was 129.  She was referred to the emergency room and repeat sodium was 133.  Patient continued to be confused during evaluation.  She was discharged home.  Since that time patient is continued to have fluctuating symptoms, mild memory problems, mild confusion, decreased activity, mild depression.  Patient denies any balance or gait difficulty.  She denies any incontinence.   REVIEW OF SYSTEMS: Full 14 system review of systems performed and negative with exception of: Confusion and memory loss.  ALLERGIES: No Known Allergies  HOME MEDICATIONS: Outpatient Medications Prior to Visit  Medication Sig Dispense Refill  . aspirin EC 81 MG tablet Take 81 mg by mouth at bedtime.    . carboxymethylcellulose (REFRESH PLUS) 0.5 % SOLN Place 1 drop into both eyes 2 (two) times daily as needed (dry eyes).    . Cholecalciferol (VITAMIN D) 2000 UNITS CAPS Take 2,000 capsules by mouth at bedtime.    Marland Kitchen diltiazem (CARDIZEM CD) 180 MG 24 hr capsule Take 180 mg by mouth daily.    Marland Kitchen guaiFENesin (MUCINEX) 600 MG 12 hr tablet Take 600 mg by mouth 2 (two) times daily as needed for to loosen phlegm.    Marland Kitchen guaiFENesin-dextromethorphan (ROBITUSSIN  DM) 100-10 MG/5ML syrup Take 10 mLs by mouth every 4 (four) hours as needed for cough.    . hydrALAZINE (APRESOLINE) 25 MG tablet Take 50 mg by mouth 3 (three) times daily.     Marland Kitchen lisinopril (PRINIVIL,ZESTRIL) 40 MG tablet Take 40 mg by mouth daily.    Marland Kitchen loratadine (CLARITIN) 10 MG tablet Take 10 mg by mouth daily as needed for allergies.    . Multiple Vitamin (MULTIVITAMIN WITH MINERALS) TABS tablet Take 1 tablet by mouth daily.    . Multiple Vitamins-Minerals (HAIR/SKIN/NAILS) TABS Take 1 tablet by mouth daily.      . Omega-3 Fatty Acids (FISH OIL) 1200 MG CAPS Take 1,200 mg by mouth daily.    . hydrochlorothiazide (HYDRODIURIL) 25 MG tablet Take 25 mg by mouth daily.     No facility-administered medications prior to visit.     PAST MEDICAL HISTORY: Past Medical History:  Diagnosis Date  . Glaucoma   . Hyperlipidemia   . Hypertension   . Right bundle branch block     PAST SURGICAL HISTORY: Past Surgical History:  Procedure Laterality Date  . ABDOMINAL HYSTERECTOMY     TAH-BSO  . COLONOSCOPY WITH PROPOFOL N/A 02/06/2015   Procedure: COLONOSCOPY WITH PROPOFOL;  Surgeon: Charolett Bumpers, MD;  Location: [facility];  Service: Endoscopy;  Laterality: N/A;  . EYE SURGERY Bilateral    catraract    FAMILY HISTORY: Family History  Problem Relation Age of Onset  . Hypertension Mother   . Hypertension Father   . Stroke Sister   . Cancer Brother   . Heart disease Brother   . Cancer Brother   . Hypertension Sister   . Breast cancer Neg Hx     SOCIAL HISTORY: Social History   Socioeconomic History  . Marital status: Married    Spouse name: Not on file  . Number of children: 2  . Years of education: some college  . Highest education level: Not on file  Occupational History    Comment: retired  Engineer, production  . Financial resource strain: Not on file  . Food insecurity:    Worry: Not on file    Inability: Not on file  . Transportation needs:    Medical: Not on file    Non-medical: Not on file  Tobacco Use  . Smoking status: Never Smoker  . Smokeless tobacco: Never Used  Substance and Sexual Activity  . Alcohol use: No  . Drug use: No  . Sexual activity: Not on file  Lifestyle  . Physical activity:    Days per week: Not on file    Minutes per session: Not on file  . Stress: Not on file  Relationships  . Social connections:    Talks on phone: Not on file    Gets together: Not on file    Attends religious service: Not on file    Active member of club or organization: Not  on file    Attends meetings of clubs or organizations: Not on file    Relationship status: Not on file  . Intimate partner violence:    Fear of current or ex partner: Not on file    Emotionally abused: Not on file    Physically abused: Not on file    Forced sexual activity: Not on file  Other Topics Concern  . Not on file  Social History Narrative   Caffeine- coffee, 1 daily.  Lives home with husband-[name].  Education 3 yrs college.  Retired.  PHYSICAL EXAM  GENERAL EXAM/CONSTITUTIONAL: Vitals:  Vitals:   02/27/18 1208  BP: (!) 151/79  Pulse: 68  Weight: 163 lb 6.4 oz (74.1 kg)  Height: 5\' 5"  (1.651 m)     Body mass index is 27.19 kg/m. Wt Readings from Last 3 Encounters:  02/27/18 163 lb 6.4 oz (74.1 kg)  02/06/15 171 lb (77.6 kg)     Patient is in no distress; well developed, nourished and groomed; neck is supple  CARDIOVASCULAR:  Examination of carotid arteries is normal; no carotid bruits  Regular rate and rhythm, no murmurs  Examination of peripheral vascular system by observation and palpation is normal  EYES:  Ophthalmoscopic exam of optic discs and posterior segments is normal; no papilledema or hemorrhages  Visual Acuity Screening   Right eye Left eye Both eyes  Without correction: 20/30 20/40   With correction:        MUSCULOSKELETAL:  Gait, strength, tone, movements noted in Neurologic exam below  NEUROLOGIC: MENTAL STATUS:  MMSE - Mini Mental State Exam 02/27/2018  Orientation to time 5  Orientation to Place 5  Registration 3  Attention/ Calculation 0  Recall 2  Language- name 2 objects 2  Language- repeat 1  Language- follow 3 step command 3  Language- read & follow direction 1  Write a sentence 1  Copy design 0  Total score 23    awake, alert, oriented to person, place and time  recent and remote memory intact  normal attention and concentration  language fluent, comprehension intact, naming intact  fund of  knowledge appropriate  CRANIAL NERVE:   2nd - no papilledema on fundoscopic exam  2nd, 3rd, 4th, 6th - pupils equal and reactive to light, visual fields full to confrontation, extraocular muscles intact, no nystagmus  5th - facial sensation symmetric  7th - facial strength symmetric  8th - hearing intact  9th - palate elevates symmetrically, uvula midline  11th - shoulder shrug symmetric  12th - tongue protrusion midline  MOTOR:   normal bulk and tone, full strength in the BUE, BLE  SENSORY:   normal and symmetric to light touch, temperature, vibration  COORDINATION:   finger-nose-finger, fine finger movements normal  REFLEXES:   deep tendon reflexes present and symmetric  no frontal release signs  GAIT/STATION:   narrow based gait      DIAGNOSTIC DATA (LABS, IMAGING, TESTING) - I reviewed patient records, labs, notes, testing and imaging myself where available.  No results found for: WBC, HGB, HCT, MCV, PLT No results found for: NA, K, CL, CO2, GLUCOSE, BUN, CREATININE, CALCIUM, PROT, ALBUMIN, AST, ALT, ALKPHOS, BILITOT, GFRNONAA, GFRAA No results found for: CHOL, HDL, LDLCALC, LDLDIRECT, TRIG, CHOLHDL No results found for: MWUX3K No results found for: VITAMINB12 No results found for: TSH    01/05/18 MRI brain [report only] - moderate ventriculomegaly   - no acute infarct - 1.1cm calcification; left frontal; ? meningioma  01/21/18 CT head [report only] 1. No acute intracranial abnormality.  2. Moderate ventriculomegaly which is nonspecific and be due to central greater than cortical atrophy or normal pressure hydrocephalus.    ASSESSMENT AND PLAN  84 y.o. year old female here with syncope and collapse on 01/04/2018, possibly related to dehydration, hyponatremia and hypokalemia.  Since that time ongoing fluctuating confusion, memory loss, agitation.  MMSE 23 out of 30.  Neuroimaging studies indicated ventriculomegaly, possible related to central  atrophy versus normal pressure hydrocephalus.  I suspect patient may have an underlying neurodegenerative process before  her episode of syncope and since that time has decompensated.  Recommend to optimize nutrition, hydration, physical and cognitive stimulating activities.  Will monitor symptoms over the next few weeks and few months.  If symptoms progress further, may consider repeat MRI of the brain, large-volume lumbar puncture, neuropsychological testing.   Ddx: MCI, dementia, NPH, metabolic  1. Memory loss     PLAN:  MEMORY LOSS / CONFUSION (MCI, dementia, NPH, metabolic) - safety / supervision issues reviewed - caregiver resources provided - caution with driving and finances - will request prior imaging studies  Return in about 6 months (around 08/29/2018).    Suanne Marker, MD 02/27/2018, 12:49 PM Certified in Neurology, Neurophysiology and Neuroimaging  [facility] [address] [place], [place] [address] [phone]

## 2018-02-27 NOTE — Patient Instructions (Signed)
  MEMORY LOSS / CONFUSION (MCI, dementia, NPH, metabolic) - safety / supervision issues reviewed - caution with driving and finances - will request prior imaging studies

## 2018-03-04 DIAGNOSIS — R55 Syncope and collapse: Secondary | ICD-10-CM | POA: Diagnosis not present

## 2018-03-12 DIAGNOSIS — K625 Hemorrhage of anus and rectum: Secondary | ICD-10-CM | POA: Diagnosis not present

## 2018-03-12 DIAGNOSIS — K648 Other hemorrhoids: Secondary | ICD-10-CM | POA: Diagnosis not present

## 2018-03-12 DIAGNOSIS — R109 Unspecified abdominal pain: Secondary | ICD-10-CM | POA: Diagnosis not present

## 2018-03-17 DIAGNOSIS — I10 Essential (primary) hypertension: Secondary | ICD-10-CM | POA: Diagnosis not present

## 2018-03-17 DIAGNOSIS — R55 Syncope and collapse: Secondary | ICD-10-CM | POA: Diagnosis not present

## 2018-03-18 HISTORY — PX: OTHER SURGICAL HISTORY: SHX169

## 2018-03-20 DIAGNOSIS — K0889 Other specified disorders of teeth and supporting structures: Secondary | ICD-10-CM | POA: Diagnosis not present

## 2018-04-06 DIAGNOSIS — K625 Hemorrhage of anus and rectum: Secondary | ICD-10-CM | POA: Diagnosis not present

## 2018-04-17 DIAGNOSIS — J069 Acute upper respiratory infection, unspecified: Secondary | ICD-10-CM | POA: Diagnosis not present

## 2018-05-07 DIAGNOSIS — R319 Hematuria, unspecified: Secondary | ICD-10-CM | POA: Diagnosis not present

## 2018-05-07 DIAGNOSIS — N390 Urinary tract infection, site not specified: Secondary | ICD-10-CM | POA: Diagnosis not present

## 2018-05-22 DIAGNOSIS — K648 Other hemorrhoids: Secondary | ICD-10-CM | POA: Diagnosis not present

## 2018-05-28 DIAGNOSIS — E2839 Other primary ovarian failure: Secondary | ICD-10-CM | POA: Diagnosis not present

## 2018-05-28 DIAGNOSIS — I10 Essential (primary) hypertension: Secondary | ICD-10-CM | POA: Diagnosis not present

## 2018-05-28 DIAGNOSIS — R8290 Unspecified abnormal findings in urine: Secondary | ICD-10-CM | POA: Diagnosis not present

## 2018-05-28 DIAGNOSIS — E039 Hypothyroidism, unspecified: Secondary | ICD-10-CM | POA: Diagnosis not present

## 2018-05-28 DIAGNOSIS — E782 Mixed hyperlipidemia: Secondary | ICD-10-CM | POA: Diagnosis not present

## 2018-05-28 DIAGNOSIS — Z0000 Encounter for general adult medical examination without abnormal findings: Secondary | ICD-10-CM | POA: Diagnosis not present

## 2018-06-01 DIAGNOSIS — E871 Hypo-osmolality and hyponatremia: Secondary | ICD-10-CM | POA: Diagnosis not present

## 2018-06-04 ENCOUNTER — Other Ambulatory Visit: Payer: Self-pay | Admitting: Family Medicine

## 2018-06-04 DIAGNOSIS — Z1231 Encounter for screening mammogram for malignant neoplasm of breast: Secondary | ICD-10-CM

## 2018-06-04 DIAGNOSIS — E2839 Other primary ovarian failure: Secondary | ICD-10-CM

## 2018-06-16 ENCOUNTER — Ambulatory Visit: Payer: Self-pay | Admitting: Cardiology

## 2018-06-20 DIAGNOSIS — R410 Disorientation, unspecified: Secondary | ICD-10-CM | POA: Diagnosis not present

## 2018-06-20 DIAGNOSIS — I998 Other disorder of circulatory system: Secondary | ICD-10-CM | POA: Diagnosis not present

## 2018-06-20 DIAGNOSIS — G319 Degenerative disease of nervous system, unspecified: Secondary | ICD-10-CM | POA: Diagnosis not present

## 2018-06-20 DIAGNOSIS — R9431 Abnormal electrocardiogram [ECG] [EKG]: Secondary | ICD-10-CM | POA: Diagnosis not present

## 2018-06-20 DIAGNOSIS — Z79899 Other long term (current) drug therapy: Secondary | ICD-10-CM | POA: Diagnosis not present

## 2018-06-20 DIAGNOSIS — R7989 Other specified abnormal findings of blood chemistry: Secondary | ICD-10-CM | POA: Diagnosis not present

## 2018-06-20 DIAGNOSIS — I10 Essential (primary) hypertension: Secondary | ICD-10-CM | POA: Diagnosis not present

## 2018-06-21 ENCOUNTER — Telehealth: Payer: Self-pay | Admitting: Cardiology

## 2018-06-21 NOTE — Telephone Encounter (Signed)
Patient called stating BP has been elevated 170-s/80s on current antihypertensive therapy. Increased hydralazine to 150 mg-100 mg-150 mg from 100 mg tid. Will arrange virtual visit in the coming week or two for further follow up.

## 2018-06-22 DIAGNOSIS — I10 Essential (primary) hypertension: Secondary | ICD-10-CM | POA: Insufficient documentation

## 2018-06-22 DIAGNOSIS — E878 Other disorders of electrolyte and fluid balance, not elsewhere classified: Secondary | ICD-10-CM | POA: Diagnosis not present

## 2018-06-22 NOTE — Progress Notes (Signed)
Subjective:  Primary Physician:  Juluis Rainier, MD  Patient ID: April Douglas, female    DOB: 07-Jan-1934, 85 y.o.   MRN: [id number]  This visit type was conducted due to national recommendations for restrictions regarding the COVID-19 Pandemic (e.g. social distancing).  This format is felt to be most appropriate for this patient at this time.  All issues noted in this document were discussed and addressed.  No physical exam was performed (except for noted visual exam findings with Telehealth visits - very limited).  The patient has consented to conduct a Telehealth visit and understands insurance will be billed.   I connected with patient, on 06/23/2018  by a video enabled telemedicine application and verified that I am speaking with the correct person using two identifiers.     I discussed the limitations of evaluation and management by telemedicine and the availability of in person appointments. The patient expressed understanding and agreed to proceed.   I have discussed with patient regarding the safety during COVID Pandemic regarding social distancing and other precautions.  Chief Complaint  Patient presents with  . Hypertension  . Follow-up    HPI: April Douglas  is a 85 y.o. female . On 06/20/2018, patient felt lightheaded and had intermittent confusion.She was taken to ER at [facility]  Her evaluation was unremarkable and she was discharged home.  Symptoms resolved after a couple of hours.   She denies any dizziness, near-syncope or syncope. Patient had a syncopal episode on 01/04/2018 when she was sitting at the airport in [place].  She was admitted at [facility].  Syncope was diagnosed to be due to severe dehydration with low blood pressure.  She also had hyponatremia and hypokalemia at that time.   Patient has been monitoring blood pressure at home.  2 days ago, her pressure was high -systolic in 170s and diastolic in the 80s.  However,  subsequently the readings have been normal.  Patient denies any complaints of chest pain, tightness or pressure. No shortness of breath, orthopnea or PND. No palpitation, sudden heart racing, dizziness or syncope. No history of swelling on the legs and no claudication.  No history of diabetes. Has borderline high LDL cholesterol. She does not smoke. Patient exercises 4 days a week, she goes to seniors' aerobic class (has not been able to do it in past few weeks because of Covid virus lockdown)..  Patient also has history of thyroid nodule, TSH has been normal.    Past Medical History:  Diagnosis Date  . Dehydration   . Dizziness   . Elevated serum creatinine   . Glaucoma   . Hyperlipidemia   . Hypertension   . Right bundle branch block   . Seasonal affective disorder [facility])     Past Surgical History:  Procedure Laterality Date  . ABDOMINAL HYSTERECTOMY     TAH-BSO  . CATARACT EXTRACTION Bilateral   . COLONOSCOPY WITH PROPOFOL N/A 02/06/2015   Procedure: COLONOSCOPY WITH PROPOFOL;  Surgeon: Charolett Bumpers, MD;  Location: [facility];  Service: Endoscopy;  Laterality: N/A;    Social History   Socioeconomic History  . Marital status: Married    Spouse name: Not on file  . Number of children: 2  . Years of education: some college  . Highest education level: Not on file  Occupational History    Comment: retired  Engineer, production  . Financial resource strain: Not on file  . Food insecurity:  Worry: Not on file    Inability: Not on file  . Transportation needs:    Medical: Not on file    Non-medical: Not on file  Tobacco Use  . Smoking status: Never Smoker  . Smokeless tobacco: Never Used  Substance and Sexual Activity  . Alcohol use: No  . Drug use: No  . Sexual activity: Not on file  Lifestyle  . Physical activity:    Days per week: Not on file    Minutes per session: Not on file  . Stress: Not on file  Relationships  . Social connections:    Talks on phone:  Not on file    Gets together: Not on file    Attends religious service: Not on file    Active member of club or organization: Not on file    Attends meetings of clubs or organizations: Not on file    Relationship status: Not on file  . Intimate partner violence:    Fear of current or ex partner: Not on file    Emotionally abused: Not on file    Physically abused: Not on file    Forced sexual activity: Not on file  Other Topics Concern  . Not on file  Social History Narrative   Caffeine- coffee, 1 daily.  Lives home with husband-[name].  Education 3 yrs college.  Retired.      Current Outpatient Medications on File Prior to Visit  Medication Sig Dispense Refill  . Bacillus Coagulans-Inulin (ALIGN PREBIOTIC-PROBIOTIC PO) Take by mouth.    . carboxymethylcellulose (REFRESH PLUS) 0.5 % SOLN Place 1 drop into both eyes 2 (two) times daily as needed (dry eyes).    . Cholecalciferol (VITAMIN D) 2000 UNITS CAPS Take 2,000 capsules by mouth at bedtime.    Marland Kitchen diltiazem (CARDIZEM CD) 180 MG 24 hr capsule Take 180 mg by mouth daily.    Marland Kitchen guaiFENesin (MUCINEX) 600 MG 12 hr tablet Take 600 mg by mouth 2 (two) times daily as needed for to loosen phlegm.    . hydrALAZINE (APRESOLINE) 25 MG tablet Take 100 mg by mouth 3 (three) times daily.    Marland Kitchen lisinopril (PRINIVIL,ZESTRIL) 40 MG tablet Take 40 mg by mouth daily.    Marland Kitchen loratadine (CLARITIN) 10 MG tablet Take 10 mg by mouth daily as needed for allergies.    . Multiple Vitamin (MULTIVITAMIN WITH MINERALS) TABS tablet Take 1 tablet by mouth daily.    . Multiple Vitamins-Minerals (HAIR/SKIN/NAILS) TABS Take 1 tablet by mouth daily.    . Omega-3 Fatty Acids (FISH OIL) 1200 MG CAPS Take 1,200 mg by mouth daily.     No current facility-administered medications on file prior to visit.     Review of Systems  Constitutional: Negative for fever.  HENT: Negative for nosebleeds.   Eyes: Negative for blurred vision.  Respiratory: Negative for cough.    Gastrointestinal: Negative for abdominal pain, nausea and vomiting.  Genitourinary: Negative for dysuria.  Musculoskeletal: Negative for myalgias.  Skin: Negative for itching and rash.  Neurological: Negative for dizziness and loss of consciousness.  Psychiatric/Behavioral: The patient is not nervous/anxious.        Objective:  Blood pressure (!) 119/57, pulse 76, height 5\' 5"  (1.651 m), weight 159 lb (72.1 kg). Body mass index is 26.46 kg/m.  Physical Exam  Patient was alert and oriented 3.  She appeared very comfortable and in good spirits during the visit.  No further detailed examination was possible as it was a telemedicine visit.  CARDIAC STUDIES:  Event monitor 02/03/18- 02/27/2018: Sinus rhythm/arrhtymia w/occasional PAC. No other arrhtymias.  Lexiscan myoview stress test 02/11/2018: 1. Lexiscan stress test with low level exercise was performed. Patient reached 93% of the maximum predicted heart rate. No stress symptoms reported. Blood pressure was normal. Stress electrocardiogram showed sinus tachycardia, RBBB, no stress arrhythmias and normal stress repolarization. 2. The overall quality of the study is good. There is no evidence of abnormal lung activity. Stress and rest SPECT images demonstrate homogeneous tracer distribution throughout the myocardium. Gated SPECT imaging reveals normal myocardial thickening and wall motion. The left ventricular ejection fraction was normal (67%). 3. Low risk study.  Echo at [facility] of medicine [facility], [place]-01/05/2018- normal left ventricle systolic function, EF-60-65 percent. No regional wall motion abnormalities. Normal diastolic function. Trivial TR, no pulmonary hypertension.   Assessment & Recommendations:   Essential hypertension  Laboratory Exam: 06/01/2018-sodium-136, potassium-4.5, BUN-15, creatinine-0.84, glucose-98  05/28/2018-glucose is 88, BUN-12, creatinine-0.88, sodium-132, potassium is 4.4.  Normal liver  enzymes.  TSH-1.0  Lipid Panel  05/28/2018-cholesterol-208, HDL-79, LDL-117, triglycerides-64.  Recommendation:  Systolic blood pressure was elevated 2 days ago but it has been normal subsequently.  Blood pressure is normal today also. Blood pressure control is overall satisfactory. I have advised her to continue present medications and continue monitoring blood pressure at home. Call us if it consistently remains high.  Primary prevention was again explained. She was advised to follow low-salt, low-cholesterol diet. Continue regular exercise.  Return for follow-up after 3 months but call us if there are any cardiac problems in the interim.   Earl Many, MD, [address] [facility] 06/23/2018, 12:05 PM [facility] [place] Pager: [phone] Office: [phone] If no answer Cell [phone]

## 2018-06-23 ENCOUNTER — Encounter: Payer: Self-pay | Admitting: Cardiology

## 2018-06-23 ENCOUNTER — Other Ambulatory Visit: Payer: Self-pay

## 2018-06-23 ENCOUNTER — Ambulatory Visit (INDEPENDENT_AMBULATORY_CARE_PROVIDER_SITE_OTHER): Payer: Medicare Other | Admitting: Cardiology

## 2018-06-23 VITALS — BP 119/57 | HR 76 | Ht 65.0 in | Wt 159.0 lb

## 2018-06-23 DIAGNOSIS — I10 Essential (primary) hypertension: Secondary | ICD-10-CM

## 2018-06-30 DIAGNOSIS — L811 Chloasma: Secondary | ICD-10-CM | POA: Diagnosis not present

## 2018-07-03 ENCOUNTER — Telehealth: Payer: Self-pay

## 2018-07-03 ENCOUNTER — Other Ambulatory Visit: Payer: Self-pay

## 2018-07-03 NOTE — Telephone Encounter (Signed)
Can you clarify how much hydralazine she is on? It appears that MP had increased to 150mg  in the morning and in the evening and 100mg  at noon.

## 2018-07-03 NOTE — Telephone Encounter (Signed)
Go back to the way MP had her doing for now and have her call us early next week and let us know how she responded because it appear her BP was controlled when she saw CV.

## 2018-07-03 NOTE — Telephone Encounter (Signed)
Pt of CV.Marland KitchenMarland KitchenMarland KitchenSpouse [name] called stating that pt was seen by CV on 4/7 and advised to call office if BP ran above 150 for 3 consecutive days. Since 4/12 its been 159/83, 154/82, 157/83, 159/89, 156/82 and this morning 176/91, 178/98. Please review and advise.//ah

## 2018-07-03 NOTE — Telephone Encounter (Signed)
Spouse said that pt was taking it that way but he understood that she was to go back to 2 tabs tid which is how she is currently taking.//ah

## 2018-07-03 NOTE — Telephone Encounter (Signed)
Spouse-[name] aware of AK instructions. Will cb next week.//ah

## 2018-07-06 ENCOUNTER — Telehealth: Payer: Self-pay

## 2018-07-06 NOTE — Telephone Encounter (Signed)
Pt aware.

## 2018-07-10 NOTE — Telephone Encounter (Signed)
Pt aware.//ah

## 2018-07-10 NOTE — Telephone Encounter (Signed)
Pt called in with bp readings.... 4/18: 129/7 hr 62 4/19: 149/75 hr 55 4/20: 129/74 hr 61 4/21: 124/70 hr 60 4/22: 130/67 hr 64 4/23: 142/72 hr 72

## 2018-07-21 ENCOUNTER — Ambulatory Visit: Payer: Self-pay | Admitting: Cardiology

## 2018-07-22 ENCOUNTER — Telehealth: Payer: Self-pay

## 2018-07-22 NOTE — Telephone Encounter (Signed)
Pt called and states that she has been having palpitations since last night and wants to be seen.

## 2018-07-23 ENCOUNTER — Ambulatory Visit (INDEPENDENT_AMBULATORY_CARE_PROVIDER_SITE_OTHER): Payer: Medicare Other | Admitting: Cardiology

## 2018-07-23 ENCOUNTER — Other Ambulatory Visit: Payer: Self-pay

## 2018-07-23 VITALS — BP 134/72 | HR 58 | Temp 98.3°F | Ht 65.0 in | Wt 161.3 lb

## 2018-07-23 DIAGNOSIS — I10 Essential (primary) hypertension: Secondary | ICD-10-CM | POA: Diagnosis not present

## 2018-07-23 DIAGNOSIS — R002 Palpitations: Secondary | ICD-10-CM | POA: Diagnosis not present

## 2018-07-23 NOTE — Progress Notes (Signed)
Primary Physician/Referring:  Juluis Rainier, MD  Patient ID: April Douglas, female    DOB: March 20, 1933, 85 y.o.   MRN: [id number]  Chief Complaint  Patient presents with  . Palpitations  . Hypertension    HPI: April Douglas  is a 85 y.o. female  with Hypertension, with an episode of syncope that occurred on 01/04/2018 while in the airport in [place] and was evaluated at [facility], negative workup including stress testing and echocardiogram and also event monitor.  She also has hypertension.  She continues to remain active.  She had an episode of rapid palpitations that lasted 5 minutes last night while she was in bed and was extremely concerned about this and called our office to be seen.  She has not had any recurrence.  There was no other associated symptoms.  She denies any dizziness or near syncope.  Past Medical History:  Diagnosis Date  . Dehydration   . Dizziness   . Elevated serum creatinine   . Glaucoma   . Hyperlipidemia   . Hypertension   . Right bundle branch block   . Seasonal affective disorder [facility])     Past Surgical History:  Procedure Laterality Date  . ABDOMINAL HYSTERECTOMY     TAH-BSO  . CATARACT EXTRACTION Bilateral   . COLONOSCOPY WITH PROPOFOL N/A 02/06/2015   Procedure: COLONOSCOPY WITH PROPOFOL;  Surgeon: Charolett Bumpers, MD;  Location: [facility];  Service: Endoscopy;  Laterality: N/A;    Social History   Socioeconomic History  . Marital status: Married    Spouse name: Not on file  . Number of children: 2  . Years of education: some college  . Highest education level: Not on file  Occupational History    Comment: retired  Engineer, production  . Financial resource strain: Not on file  . Food insecurity:    Worry: Not on file    Inability: Not on file  . Transportation needs:    Medical: Not on file    Non-medical: Not on file  Tobacco Use  . Smoking status: Never Smoker  . Smokeless tobacco: Never Used  Substance and Sexual  Activity  . Alcohol use: No  . Drug use: No  . Sexual activity: Not on file  Lifestyle  . Physical activity:    Days per week: Not on file    Minutes per session: Not on file  . Stress: Not on file  Relationships  . Social connections:    Talks on phone: Not on file    Gets together: Not on file    Attends religious service: Not on file    Active member of club or organization: Not on file    Attends meetings of clubs or organizations: Not on file    Relationship status: Not on file  . Intimate partner violence:    Fear of current or ex partner: Not on file    Emotionally abused: Not on file    Physically abused: Not on file    Forced sexual activity: Not on file  Other Topics Concern  . Not on file  Social History Narrative   Caffeine- coffee, 1 daily.  Lives home with husband-[name].  Education 3 yrs college.  Retired.      Current Outpatient Medications on File Prior to Visit  Medication Sig Dispense Refill  . Bacillus Coagulans-Inulin (ALIGN PREBIOTIC-PROBIOTIC PO) Take by mouth.    . carboxymethylcellulose (REFRESH PLUS) 0.5 % SOLN Place 1 drop into both eyes 2 (  two) times daily as needed (dry eyes).    . Cholecalciferol (VITAMIN D) 2000 UNITS CAPS Take 2,000 capsules by mouth at bedtime.    Marland Kitchen diltiazem (CARDIZEM CD) 180 MG 24 hr capsule Take 180 mg by mouth daily.    Marland Kitchen guaiFENesin (MUCINEX) 600 MG 12 hr tablet Take 600 mg by mouth 2 (two) times daily as needed for to loosen phlegm.    . hydrALAZINE (APRESOLINE) 25 MG tablet 150mg  morning and evening, 100mg  at 12noon    . lisinopril (PRINIVIL,ZESTRIL) 40 MG tablet Take 40 mg by mouth daily.    Marland Kitchen loratadine (CLARITIN) 10 MG tablet Take 10 mg by mouth daily as needed for allergies.    . Multiple Vitamin (MULTIVITAMIN WITH MINERALS) TABS tablet Take 1 tablet by mouth daily.    . Omega-3 Fatty Acids (FISH OIL) 1200 MG CAPS Take 1,200 mg by mouth daily.     No current facility-administered medications on file prior to visit.      Review of Systems  Constitution: Negative for chills, decreased appetite, malaise/fatigue and weight gain.  Cardiovascular: Positive for palpitations (once last night for 5 min). Negative for dyspnea on exertion, leg swelling and syncope.  Endocrine: Negative for cold intolerance.  Hematologic/Lymphatic: Does not bruise/bleed easily.  Musculoskeletal: Negative for joint swelling.  Gastrointestinal: Negative for abdominal pain, anorexia and change in bowel habit.  Neurological: Negative for headaches and light-headedness.  Psychiatric/Behavioral: Negative for depression and substance abuse.  All other systems reviewed and are negative.     Objective  Blood pressure 134/72, pulse (!) 58, temperature 98.3 F (36.8 C), height 5\' 5"  (1.651 m), weight 161 lb 4.8 oz (73.2 kg), SpO2 98 %. Body mass index is 26.84 kg/m.    Physical Exam  Constitutional: She appears well-developed and well-nourished. No distress.  HENT:  Head: Atraumatic.  Eyes: Conjunctivae are normal.  Neck: Neck supple. No JVD present. No thyromegaly present.  Cardiovascular: Normal rate, regular rhythm, normal heart sounds and intact distal pulses. Exam reveals no gallop.  No murmur heard. Pulmonary/Chest: Effort normal and breath sounds normal.  Abdominal: Soft. Bowel sounds are normal.  Musculoskeletal: Normal range of motion.        General: No edema.  Neurological: She is alert.  Skin: Skin is warm and dry.  Psychiatric: She has a normal mood and affect.   Radiology: No results found.  Laboratory examination:   06/01/2018-sodium-136, potassium-4.5, BUN-15, creatinine-0.84, glucose-98  05/28/2018-glucose is 88, BUN-12, creatinine-0.88, sodium-132, potassium is 4.4.  Normal liver enzymes.  TSH-1.0  Lipid Panel  05/28/2018-cholesterol-208, HDL-79, LDL-117, triglycerides-64.  Cardiac Studies:    Event monitor 02/03/18- 02/27/2018: Sinus rhythm/arrhtymia w/occasional PAC. No other arrhtymias.   Lexiscan myoview stress test 02/11/2018: 1. Lexiscan stress test with low level exercise was performed. Patient reached 93% of the maximum predicted heart rate. No stress symptoms reported. Blood pressure was normal. Stress electrocardiogram showed sinus tachycardia, RBBB, no stress arrhythmias and normal stress repolarization. 2. The overall quality of the study is good. There is no evidence of abnormal lung activity. Stress and rest SPECT images demonstrate homogeneous tracer distribution throughout the myocardium. Gated SPECT imaging reveals normal myocardial thickening and wall motion. The left ventricular ejection fraction was normal (67%). 3. Low risk study.  Echo at [facility] of medicine [facility], [place]-01/05/2018- normal left ventricle systolic function, EF-60-65 percent. No regional wall motion abnormalities. Normal diastolic function. Trivial TR, no pulmonary hypertension.  Assessment   Palpitations - Plan: EKG 12-Lead  Essential hypertension  EKG  07/23/2018: Sinus rhythm with borderline short PR interval at 120 ms.  Left atrial abnormality.  Normal axis.  Right bundle branch block.  PAC.  No evidence of ischemia. No prior EKG to compare.  Recommendations:   Patient seen office for evaluation of palpitations, EKG reveals borderline short PR interval, otherwise has underlying right bundle branch block And a single PAC. Patient would like to wait on wearing the monitor as she states that she has not had any recurrence for the past 2 days.  She has not had any recurrence of syncope or near syncope. Previously also event monitor had revealed occasional PACs.  I reviewed with the patient, blood pressure is no well-controlled, she is on diltiazem CD along with lisinopril and hydralazine which she is tolerating. No changes in the medications were done today.  She'll follow-up with Dr. Florian Buff In 3 months as previously scheduled.  Yates Decamp, MD, [facility] 07/23/2018, 11:16 AM [facility] Pager: [phone] Office: [phone] If no answer Cell [phone]

## 2018-07-24 ENCOUNTER — Telehealth: Payer: Self-pay

## 2018-07-24 ENCOUNTER — Other Ambulatory Visit: Payer: Self-pay | Admitting: Cardiology

## 2018-07-24 ENCOUNTER — Encounter: Payer: Self-pay | Admitting: Cardiology

## 2018-07-24 DIAGNOSIS — I10 Essential (primary) hypertension: Secondary | ICD-10-CM

## 2018-07-24 MED ORDER — HYDRALAZINE HCL 100 MG PO TABS: 100.0000 mg | ORAL_TABLET | ORAL | 0 refills | Status: DC

## 2018-07-24 NOTE — Telephone Encounter (Signed)
I refilled this, but pretty hefty dose and was not sure about this, just [facility]

## 2018-07-24 NOTE — Progress Notes (Signed)
  Dear April Douglas,,   April Douglas is at low risk, from a cardiac standpoint,  For her upcoming procedure, tooth extraction.  It is ok to proceed without further cardiac testing. No endocarditis prophylaxis needed.  JG

## 2018-07-27 ENCOUNTER — Telehealth: Payer: Self-pay

## 2018-07-27 NOTE — Telephone Encounter (Signed)
Please fill

## 2018-07-28 DIAGNOSIS — J302 Other seasonal allergic rhinitis: Secondary | ICD-10-CM | POA: Diagnosis not present

## 2018-07-28 DIAGNOSIS — K089 Disorder of teeth and supporting structures, unspecified: Secondary | ICD-10-CM | POA: Diagnosis not present

## 2018-07-30 ENCOUNTER — Encounter: Payer: Self-pay | Admitting: *Deleted

## 2018-07-30 ENCOUNTER — Telehealth: Payer: Self-pay | Admitting: *Deleted

## 2018-07-30 NOTE — Telephone Encounter (Signed)
She says you verbally changed it to 3 in the morning 2 in the afternoon and 3 at night ? And the new rx she picked up and the directions are different the new prescription is 100mg  and the old was 50mg ; Can you please call the patient she is very confused [phone]

## 2018-07-30 NOTE — Telephone Encounter (Signed)
Spoke with patient and advised her that Due to current COVID 19 pandemic, our office is severely reducing in person visits in order to minimize the risk to our patients and healthcare providers. We recommend to convert your appointment to a video visit. We'll take all precautions to reduce any security or privacy concerns. This will be treated like an office visit, and we will file with your insurance. She stated her husband has assisted her with video visits. She consented to video visit , e mail : [email or web address]. We rescheduled for sooner, updated EMR

## 2018-08-03 ENCOUNTER — Other Ambulatory Visit: Payer: Self-pay

## 2018-08-03 ENCOUNTER — Ambulatory Visit (INDEPENDENT_AMBULATORY_CARE_PROVIDER_SITE_OTHER): Payer: Medicare Other | Admitting: Diagnostic Neuroimaging

## 2018-08-03 DIAGNOSIS — R413 Other amnesia: Secondary | ICD-10-CM | POA: Diagnosis not present

## 2018-08-03 NOTE — Progress Notes (Signed)
    Virtual Visit via Video Note  I connected with April Douglas on 08/03/18 at 11:00 AM EDT by a video enabled telemedicine application and verified that I am speaking with the correct person using two identifiers.   I discussed the limitations of evaluation and management by telemedicine and the availability of in person appointments. The patient expressed understanding and agreed to proceed.  Patient is at their home. I am at the office.    History of Present Illness:  - since last visit, patient feels stable - husband also feels symptoms are stable - no other concerns    Observations/Objective:   - awake, alert - face symm - no dysarthria    Assessment and Plan:  Dx:  1. Memory loss      MEMORY LOSS / CONFUSION (MCI, dementia, NPH) - safety / supervision issues reviewed - caregiver resources provided - caution with driving and finances   Follow Up Instructions:  - Return for return to PCP, pending if symptoms worsen or fail to improve.    I discussed the assessment and treatment plan with the patient. The patient was provided an opportunity to ask questions and all were answered. The patient agreed with the plan and demonstrated an understanding of the instructions.   The patient was advised to call back or seek an in-person evaluation if the symptoms worsen or if the condition fails to improve as anticipated.  I provided 15 minutes of non-face-to-face time during this encounter.   Penni Bombard, MD 5/79/0383, 33:83 AM Certified in Neurology, Neurophysiology and Neuroimaging  [facility] [address], [place], [place] [address] [phone]

## 2018-08-11 ENCOUNTER — Other Ambulatory Visit: Payer: Medicare Other

## 2018-08-11 ENCOUNTER — Ambulatory Visit: Payer: Medicare Other

## 2018-08-24 ENCOUNTER — Other Ambulatory Visit: Payer: Self-pay

## 2018-08-24 DIAGNOSIS — I10 Essential (primary) hypertension: Secondary | ICD-10-CM

## 2018-08-24 MED ORDER — HYDRALAZINE HCL 100 MG PO TABS: 100.0000 mg | ORAL_TABLET | Freq: Three times a day (TID) | ORAL | 1 refills | Status: DC

## 2018-08-31 ENCOUNTER — Ambulatory Visit: Payer: Medicare Other | Admitting: Diagnostic Neuroimaging

## 2018-09-03 DIAGNOSIS — J302 Other seasonal allergic rhinitis: Secondary | ICD-10-CM | POA: Diagnosis not present

## 2018-09-08 ENCOUNTER — Telehealth: Payer: Self-pay | Admitting: Diagnostic Neuroimaging

## 2018-09-08 NOTE — Telephone Encounter (Signed)
Yes, may consider memantine. I had referred patient back to PCP as well, so could be rx'd by PCP.  Otherwise I can send in rx. -VRP

## 2018-09-08 NOTE — Telephone Encounter (Signed)
I called daughter and informed her of Dr Gladstone Lighter reply. She stated she would discuss with her mother and send message to PCP she will call back if needed. She verbalized understanding, appreciation.

## 2018-09-08 NOTE — Telephone Encounter (Signed)
Pt daughter(on DPR) is asking for a call from Dr Leta Baptist to discuss pt's last appointment and pt's medications.  Pt was told the message would be routed to RN, daughter states she would prefer a call from Dr Leta Baptist because if she has questions the nurse would have to go back to him.  Please call

## 2018-09-08 NOTE — Telephone Encounter (Addendum)
Called daughter, Threasa Beards who stated she would like to know if Aricept,  namzaric and/or OTC supplements would be helpful now to preserve her mother's memory and slow down the progression of dementia. If not, she wants to know at what time would Dr Leta Baptist think medications are indicated. She would like Dr Gladstone Lighter recommendations so she has a good understanding of what is appropriate. She wants to know if Dr Tish Frederickson thinks it would be a detriment for patient to go on prescription meds due to side effects, and does Dr Leta Baptist think the supplements actually work, are worthwhile to consider. She is wanting to "preserve her mother's memory" as best can be done. I advised Threasa Beards I will discuss with Dr Leta Baptist. She would like him to call her to discuss.

## 2018-09-09 NOTE — Telephone Encounter (Signed)
Error

## 2018-09-10 NOTE — Telephone Encounter (Signed)
PCP called in and stated they need documentation stating namenda recommendation before med can be prescribed  Fax# [phone]

## 2018-09-14 NOTE — Telephone Encounter (Signed)
memantine 10mg  twice a day. -VRP

## 2018-09-15 NOTE — Telephone Encounter (Signed)
Note re: new Namenda prescription order for PCP, Dr Leighton Ruff to prescribe was faxed to [facility]. Received confirmation.

## 2018-09-16 DIAGNOSIS — L811 Chloasma: Secondary | ICD-10-CM | POA: Diagnosis not present

## 2018-09-17 DIAGNOSIS — H40023 Open angle with borderline findings, high risk, bilateral: Secondary | ICD-10-CM | POA: Diagnosis not present

## 2018-09-28 ENCOUNTER — Ambulatory Visit: Payer: Self-pay | Admitting: Cardiology

## 2018-09-29 ENCOUNTER — Ambulatory Visit
Admission: RE | Admit: 2018-09-29 | Discharge: 2018-09-29 | Disposition: A | Discharge status: Home / Self Care | Payer: Medicare Other | Source: Ambulatory Visit | Attending: Family Medicine | Admitting: Family Medicine

## 2018-09-29 ENCOUNTER — Other Ambulatory Visit: Payer: Self-pay

## 2018-09-29 DIAGNOSIS — E2839 Other primary ovarian failure: Secondary | ICD-10-CM

## 2018-09-29 DIAGNOSIS — Z780 Asymptomatic menopausal state: Secondary | ICD-10-CM | POA: Diagnosis not present

## 2018-09-29 DIAGNOSIS — Z1231 Encounter for screening mammogram for malignant neoplasm of breast: Secondary | ICD-10-CM | POA: Diagnosis not present

## 2018-10-23 DIAGNOSIS — R55 Syncope and collapse: Secondary | ICD-10-CM | POA: Insufficient documentation

## 2018-10-23 DIAGNOSIS — Z87898 Personal history of other specified conditions: Secondary | ICD-10-CM | POA: Insufficient documentation

## 2018-10-23 DIAGNOSIS — R002 Palpitations: Secondary | ICD-10-CM | POA: Insufficient documentation

## 2018-10-23 HISTORY — DX: Syncope and collapse: R55

## 2018-10-26 ENCOUNTER — Telehealth: Payer: Self-pay

## 2018-10-26 ENCOUNTER — Encounter: Payer: Self-pay | Admitting: Cardiology

## 2018-10-26 NOTE — Telephone Encounter (Signed)
Pt wants to know if its ok if her VV for tomorrow is in person and not virtual because its been a while since she has seen you and she wants you to listen to her heart

## 2018-10-27 ENCOUNTER — Other Ambulatory Visit: Payer: Self-pay | Admitting: Cardiology

## 2018-10-27 ENCOUNTER — Ambulatory Visit (INDEPENDENT_AMBULATORY_CARE_PROVIDER_SITE_OTHER): Payer: Medicare Other | Admitting: Cardiology

## 2018-10-27 ENCOUNTER — Other Ambulatory Visit: Payer: Self-pay

## 2018-10-27 ENCOUNTER — Encounter: Payer: Self-pay | Admitting: Cardiology

## 2018-10-27 VITALS — BP 134/71 | HR 78 | Ht 65.0 in | Wt 160.8 lb

## 2018-10-27 DIAGNOSIS — R002 Palpitations: Secondary | ICD-10-CM

## 2018-10-27 DIAGNOSIS — I10 Essential (primary) hypertension: Secondary | ICD-10-CM | POA: Diagnosis not present

## 2018-10-27 NOTE — Progress Notes (Signed)
Subjective:  Primary Physician:  Juluis Rainier, MD  Patient ID: April Douglas, female    DOB: Oct 19, 1933, 85 y.o.   MRN: [id number]   Chief Complaint  Patient presents with  . Hypertension  . Palpitations  . Follow-up    HPI: April Douglas  is a 85 y.o. female . Patient is feeling well.  She did not have any further episodes of palpitations since the last visit.She denies any dizziness, near-syncope or syncope.   Patient had a syncopal episode on 01/04/2018 when she was sitting at the airport in [place].  She was admitted at [facility].  Syncope was diagnosed to be due to severe dehydration with low blood pressure.  She also had hyponatremia and hypokalemia at that time.   Patient has been monitoring blood pressure at home, it has been normal. Patient denies any complaints of chest pain, tightness or pressure. No shortness of breath, orthopnea or PND. No history of swelling on the legs and no claudication.  No history of diabetes. Has borderline high LDL cholesterol. She does not smoke. Patient walks for 45 minutes daily.  Patient also has history of thyroid nodule, TSH has been normal.    Past Medical History:  Diagnosis Date  . Dehydration   . Dizziness   . Elevated serum creatinine   . Glaucoma   . Hyperlipidemia   . Hypertension   . Right bundle branch block   . Seasonal affective disorder [facility])     Past Surgical History:  Procedure Laterality Date  . ABDOMINAL HYSTERECTOMY     TAH-BSO  . CATARACT EXTRACTION Bilateral   . COLONOSCOPY WITH PROPOFOL N/A 02/06/2015   Procedure: COLONOSCOPY WITH PROPOFOL;  Surgeon: Charolett Bumpers, MD;  Location: [facility];  Service: Endoscopy;  Laterality: N/A;  . tooth extraction  2020    Social History   Socioeconomic History  . Marital status: Married    Spouse name: Not on file  . Number of children: 2  . Years of education: some college  . Highest education level: Not on file   Occupational History    Comment: retired  Engineer, production  . Financial resource strain: Not on file  . Food insecurity    Worry: Not on file    Inability: Not on file  . Transportation needs    Medical: Not on file    Non-medical: Not on file  Tobacco Use  . Smoking status: Never Smoker  . Smokeless tobacco: Never Used  Substance and Sexual Activity  . Alcohol use: No  . Drug use: No  . Sexual activity: Not on file  Lifestyle  . Physical activity    Days per week: Not on file    Minutes per session: Not on file  . Stress: Not on file  Relationships  . Social Musician on phone: Not on file    Gets together: Not on file    Attends religious service: Not on file    Active member of club or organization: Not on file    Attends meetings of clubs or organizations: Not on file    Relationship status: Not on file  . Intimate partner violence    Fear of current or ex partner: Not on file    Emotionally abused: Not on file    Physically abused: Not on file    Forced sexual activity: Not on file  Other Topics Concern  . Not on file  Social  History Narrative   Caffeine- coffee, 1 daily.  Lives home with husband-[name].  Education 3 yrs college.  Retired.      Current Outpatient Medications on File Prior to Visit  Medication Sig Dispense Refill  . carboxymethylcellulose (REFRESH PLUS) 0.5 % SOLN Place 1 drop into both eyes 2 (two) times daily as needed (dry eyes).    . Cholecalciferol (VITAMIN D) 2000 UNITS CAPS Take 2,000 capsules by mouth. Occasional     . diltiazem (CARDIZEM CD) 180 MG 24 hr capsule Take 180 mg by mouth daily.    Marland Kitchen FIBER PO Take by mouth 2 (two) times daily.    Marland Kitchen guaiFENesin (MUCINEX) 600 MG 12 hr tablet Take 600 mg by mouth 2 (two) times daily as needed for to loosen phlegm.    . hydrALAZINE (APRESOLINE) 100 MG tablet Take 1 tablet (100 mg total) by mouth 3 (three) times daily. 270 tablet 1  . lisinopril (PRINIVIL,ZESTRIL) 40 MG tablet Take 40 mg by  mouth daily.    Marland Kitchen loratadine (CLARITIN) 10 MG tablet Take 10 mg by mouth daily as needed for allergies.    . Multiple Vitamin (MULTIVITAMIN WITH MINERALS) TABS tablet Take 1 tablet by mouth daily.    . Omega-3 Fatty Acids (FISH OIL) 1200 MG CAPS Take 1,200 mg by mouth daily.     No current facility-administered medications on file prior to visit.    Review of Systems  Constitutional: Negative for fever.  HENT: Negative for nosebleeds.   Eyes: Negative for blurred vision.  Respiratory: Negative for cough and shortness of breath.   Cardiovascular: Negative for chest pain, palpitations, orthopnea, claudication and leg swelling.  Gastrointestinal: Negative for abdominal pain, nausea and vomiting.  Genitourinary: Negative for dysuria.  Musculoskeletal: Negative for myalgias.  Skin: Negative for itching and rash.  Neurological: Negative for dizziness and loss of consciousness.  Psychiatric/Behavioral: The patient is not nervous/anxious.       Objective:  Blood pressure 134/71, pulse 78, height 5\' 5"  (1.651 m), weight 160 lb 12.8 oz (72.9 kg), SpO2 97 %. Body mass index is 26.76 kg/m.  Physical Exam  Constitutional: She is oriented to person, place, and time. She appears well-developed and well-nourished.  HENT:  Head: Normocephalic and atraumatic.  Eyes: Conjunctivae are normal.  Neck: No thyromegaly present.  Cardiovascular: Normal rate and regular rhythm. Exam reveals gallop (S4 at apex).  No murmur heard. Pulses:      Carotid pulses are 2+ on the right side and 2+ on the left side.      Femoral pulses are 2+ on the right side and 2+ on the left side.      Dorsalis pedis pulses are 2+ on the right side and 2+ on the left side.       Posterior tibial pulses are 2+ on the right side and 2+ on the left side.  Pulmonary/Chest: She has no wheezes. She has no rales.  Abdominal: She exhibits no mass. There is no abdominal tenderness.  Musculoskeletal:        General: No edema.   Lymphadenopathy:    She has no cervical adenopathy.  Neurological: She is alert and oriented to person, place, and time.  Skin: Skin is warm.    CARDIAC STUDIES:  Event monitor 02/03/18- 02/27/2018: Sinus rhythm/arrhtymia w/occasional PAC. No other arrhtymias.  Lexiscan myoview stress test 02/11/2018: 1. Lexiscan stress test with low level exercise was performed. Patient reached 93% of the maximum predicted heart rate. No stress symptoms reported. Blood  pressure was normal. Stress electrocardiogram showed sinus tachycardia, RBBB, no stress arrhythmias and normal stress repolarization. 2. The overall quality of the study is good. There is no evidence of abnormal lung activity. Stress and rest SPECT images demonstrate homogeneous tracer distribution throughout the myocardium. Gated SPECT imaging reveals normal myocardial thickening and wall motion. The left ventricular ejection fraction was normal (67%). 3. Low risk study.  Echo at [facility] of medicine [facility], [place]-01/05/2018- normal left ventricle systolic function, EF-60-65 percent. No regional wall motion abnormalities. Normal diastolic function. Trivial TR, no pulmonary hypertension.  Assessment & Recommendations:   Essential hypertension - Plan: EKG 12-Lead-  Sinus  Rhythm  -Right bundle branch block.  -Left atrial enlargement.    Palpitations   Laboratory Exam: 06/01/2018-sodium-136, potassium-4.5, BUN-15, creatinine-0.84, glucose-98  05/28/2018-glucose is 88, BUN-12, creatinine-0.88, sodium-132, potassium is 4.4.  Normal liver enzymes.  TSH-1.0  Lipid Panel  05/28/2018-cholesterol-208, HDL-79, LDL-117, triglycerides-64.  Recommendation:  Blood pressure is well controlled.I have advised her to continue present medications and continue monitoring blood pressure at home. Call us if it  remains high. Symptoms of palpitation have resolved.  Primary prevention was again explained. She was advised to follow low-salt,  low-cholesterol diet. Continue regular walking.  Return for follow-up after 4 months but call us if there are any cardiac problems in the interim.  Earl Many, MD, [facility] 10/27/2018, 10:43 AM [facility] [place] Pager: [phone] Office: [phone] If no answer Cell [phone]

## 2018-11-03 DIAGNOSIS — E782 Mixed hyperlipidemia: Secondary | ICD-10-CM | POA: Diagnosis not present

## 2018-11-03 DIAGNOSIS — R5383 Other fatigue: Secondary | ICD-10-CM | POA: Diagnosis not present

## 2018-11-10 DIAGNOSIS — E782 Mixed hyperlipidemia: Secondary | ICD-10-CM | POA: Diagnosis not present

## 2018-11-10 DIAGNOSIS — R5383 Other fatigue: Secondary | ICD-10-CM | POA: Diagnosis not present

## 2018-11-17 DIAGNOSIS — R5383 Other fatigue: Secondary | ICD-10-CM | POA: Diagnosis not present

## 2018-11-20 DIAGNOSIS — Z23 Encounter for immunization: Secondary | ICD-10-CM | POA: Diagnosis not present

## 2018-12-18 ENCOUNTER — Other Ambulatory Visit: Payer: Self-pay

## 2018-12-18 DIAGNOSIS — H40023 Open angle with borderline findings, high risk, bilateral: Secondary | ICD-10-CM | POA: Diagnosis not present

## 2018-12-18 DIAGNOSIS — H04123 Dry eye syndrome of bilateral lacrimal glands: Secondary | ICD-10-CM | POA: Diagnosis not present

## 2018-12-18 MED ORDER — DILTIAZEM HCL ER COATED BEADS 180 MG PO CP24: 180.0000 mg | ORAL_CAPSULE | Freq: Every day | ORAL | 1 refills | Status: DC

## 2018-12-26 ENCOUNTER — Telehealth (INDEPENDENT_AMBULATORY_CARE_PROVIDER_SITE_OTHER): Payer: Medicare Other | Admitting: Cardiology

## 2018-12-26 DIAGNOSIS — R002 Palpitations: Secondary | ICD-10-CM

## 2018-12-26 DIAGNOSIS — E871 Hypo-osmolality and hyponatremia: Secondary | ICD-10-CM | POA: Diagnosis not present

## 2018-12-26 DIAGNOSIS — I10 Essential (primary) hypertension: Secondary | ICD-10-CM

## 2018-12-26 MED ORDER — ISOSORBIDE MONONITRATE ER 60 MG PO TB24: 60.0000 mg | ORAL_TABLET | Freq: Every day | ORAL | 2 refills | Status: DC

## 2018-12-26 MED ORDER — LABETALOL HCL 200 MG PO TABS: 200.0000 mg | ORAL_TABLET | Freq: Two times a day (BID) | ORAL | 3 refills | Status: DC

## 2018-12-26 NOTE — Telephone Encounter (Signed)
Patient called me about markedly elevated blood pressure, asymptomatic but blood pressure noted to be systolic of [id number] mmHg.  States that blood pressure has been  Has been greater than 150 mmHg.  I discussed with her and her husband  Very high dose of hydralazine at 100 mg t.i.d.  I am not sure that it is appropriate combination, I would like to reduce it back to 25 or even 50 mg at most t.i.d.  Patient unable to tolerate diuretics  Due to history of hyponatremia and hence I will add isosorbide mononitrate 60 mg daily and also add labetalol 200 mg p.o. b.i.d. which will also help with her palpitation.  I would like her to make an appointment  for follow-up in 2-3 weeks with EKG.  8 minutes of telephone discussions and Rx sent.    Adrian Prows, MD, [facility] 12/26/2018, 9:00 AM [facility]. [place] Pager: [phone] Office: [phone] If no answer Cell [phone]

## 2018-12-27 ENCOUNTER — Telehealth: Payer: Self-pay | Admitting: Cardiology

## 2018-12-27 DIAGNOSIS — R0902 Hypoxemia: Secondary | ICD-10-CM | POA: Diagnosis not present

## 2018-12-27 DIAGNOSIS — R4020 Unspecified coma: Secondary | ICD-10-CM | POA: Diagnosis not present

## 2018-12-27 DIAGNOSIS — I491 Atrial premature depolarization: Secondary | ICD-10-CM | POA: Diagnosis not present

## 2018-12-27 DIAGNOSIS — I10 Essential (primary) hypertension: Secondary | ICD-10-CM | POA: Diagnosis not present

## 2018-12-27 DIAGNOSIS — R55 Syncope and collapse: Secondary | ICD-10-CM | POA: Diagnosis not present

## 2018-12-27 NOTE — Telephone Encounter (Signed)
Patient did not take Labetalol as per her daughter (did not start). Did take Imdur, held hydralazine last night and also this morning. BP [id number] systolic. Syncope while sitting and not witnessed as her husband had just appropriate, when he returned, she was unresponsive on the sofa, but immediately came to when he shook her.  No seizure, no incontinence.  EMS came, EKG revealed normal sinus rhythm and right bundle branch block.  Blood pressure was 96/70, improved to [id number] mmHg systolic by the time the left internal minutes.  No chest pain or dyspnea.  I suspect her syncope is probably related to vasovagal episode, however in view of her age, underlying right bundle branch block, high degree AV block needs to be excluded.  She needs an event monitor for 30 days.  I'll make changes to her medications that include the following She will continue to not take labetalol.  I may consider switching diltiazem 2 labetalol on her office visit in 3-4 weeks. She'll continue to hold hydralazine and take hydralazine 50 mg on a p.r.n. basis if systolic blood pressures greater than 160 mmHg. She'll continue with Imdur 60 mg daily, lisinopril 40 mg daily, and diltiazem CD 180 mg daily but will spread them apart at least by an hour.  I discussed with Venia Minks her daughter.  If she has recurrence of syncope or any new symptoms, to go to the emergency room.

## 2018-12-27 NOTE — Telephone Encounter (Signed)
Patient did not take Labetalol as per her daughter (did not start). Did take Imdur, held hydralazine last night and also this morning. BP [id number] systolic. Syncope while sitting and not witnessed as her husband had just appropriate, when he returned, she was unresponsive on the sofa, but immediately came to when he shook her.  No seizure, no incontinence.  EMS came, EKG revealed normal sinus rhythm and right bundle branch block.  Blood pressure was 96/70, improved to [id number] mmHg systolic by the time the left internal minutes.  No chest pain or dyspnea.

## 2018-12-28 ENCOUNTER — Other Ambulatory Visit: Payer: Self-pay

## 2018-12-28 ENCOUNTER — Ambulatory Visit: Payer: Medicare Other

## 2018-12-28 DIAGNOSIS — R55 Syncope and collapse: Secondary | ICD-10-CM

## 2019-01-22 ENCOUNTER — Ambulatory Visit (INDEPENDENT_AMBULATORY_CARE_PROVIDER_SITE_OTHER): Payer: Medicare Other | Admitting: Cardiology

## 2019-01-22 ENCOUNTER — Encounter: Payer: Self-pay | Admitting: Cardiology

## 2019-01-22 ENCOUNTER — Other Ambulatory Visit: Payer: Self-pay

## 2019-01-22 VITALS — Temp 97.8°F | Ht 65.0 in | Wt 169.0 lb

## 2019-01-22 DIAGNOSIS — I10 Essential (primary) hypertension: Secondary | ICD-10-CM

## 2019-01-22 DIAGNOSIS — R55 Syncope and collapse: Secondary | ICD-10-CM | POA: Diagnosis not present

## 2019-01-22 MED ORDER — OLMESARTAN MEDOXOMIL 40 MG PO TABS: 40.0000 mg | ORAL_TABLET | Freq: Every evening | ORAL | 2 refills | Status: DC

## 2019-01-22 MED ORDER — HYDRALAZINE HCL 100 MG PO TABS: 50.0000 mg | ORAL_TABLET | Freq: Three times a day (TID) | ORAL | 1 refills | Status: DC | PRN

## 2019-01-22 NOTE — Progress Notes (Addendum)
Primary Physician/Referring:  Juluis Rainier, MD  Patient ID: April Douglas, female    DOB: 15-Oct-1933, 85 y.o.   MRN: [id number]  Chief Complaint  Patient presents with  . Hypertension  . Follow-up    3 week  . Loss of Consciousness   HPI:    April Douglas  is a 85 y.o. Fairly active African- American female with hypertension, mild hyperlipidemia, who is had multiple episodes of lightheadedness, intermittent confusion, evaluated on 06/20/2018 in [place] ED, syncope on 01/04/2018 also sitting in the airport and evaluated at [facility], found to be hyponatremic and hypokalemic and dehydrated.  She had been doing well but has noticed fluctuation in blood pressure.    She had called me on 12/26/2018 stating that her blood pressure was markedly elevated with systolic of 200 mmHg.  I had reduce the dose of hydralazine from 100 mg t.i.d. to 50 mg t.i.d. and added isosorbide mononitrate 60 mg daily and also labetalol 200 mg p.o. b.i.d.  She was also complaining of palpitations.  However the following day she had an episode of syncope was sitting in chair, unwitnessed as her husband was in the kitchen, when he came back saw her slumped in the chair but woke up immediately, EMS arrived and blood pressure was found to be 96/70 mmHg which improved to 130 mmHg by the time the left.  EKG did not reveal any significant abnormality.  In view of significant variation and fluctuations in blood pressure, this is our office visit.  Past Medical History:  Diagnosis Date  . Dehydration   . Dizziness   . Elevated serum creatinine   . Glaucoma   . Hyperlipidemia   . Hypertension   . Right bundle branch block   . Seasonal affective disorder [facility])    Past Surgical History:  Procedure Laterality Date  . ABDOMINAL HYSTERECTOMY     TAH-BSO  . CATARACT EXTRACTION Bilateral   . COLONOSCOPY WITH PROPOFOL N/A 02/06/2015   Procedure: COLONOSCOPY WITH PROPOFOL;  Surgeon: Charolett Bumpers, MD;  Location: [facility];  Service: Endoscopy;  Laterality: N/A;  . tooth extraction  2020   Social History   Socioeconomic History  . Marital status: Married    Spouse name: Not on file  . Number of children: 2  . Years of education: some college  . Highest education level: Not on file  Occupational History    Comment: retired  Engineer, production  . Financial resource strain: Not on file  . Food insecurity    Worry: Not on file    Inability: Not on file  . Transportation needs    Medical: Not on file    Non-medical: Not on file  Tobacco Use  . Smoking status: Never Smoker  . Smokeless tobacco: Never Used  Substance and Sexual Activity  . Alcohol use: No  . Drug use: No  . Sexual activity: Not on file  Lifestyle  . Physical activity    Days per week: Not on file    Minutes per session: Not on file  . Stress: Not on file  Relationships  . Social Musician on phone: Not on file    Gets together: Not on file    Attends religious service: Not on file    Active member of club or organization: Not on file    Attends meetings of clubs or organizations: Not on file    Relationship status: Not on file  .  Intimate partner violence    Fear of current or ex partner: Not on file    Emotionally abused: Not on file    Physically abused: Not on file    Forced sexual activity: Not on file  Other Topics Concern  . Not on file  Social History Narrative   Caffeine- coffee, 1 daily.  Lives home with husband-April Douglas.  Education 3 yrs college.  Retired.     ROS  Review of Systems  Constitution: Negative for chills, decreased appetite, malaise/fatigue and weight gain.  Cardiovascular: Negative for dyspnea on exertion, leg swelling and syncope.  Endocrine: Negative for cold intolerance.  Hematologic/Lymphatic: Does not bruise/bleed easily.  Musculoskeletal: Negative for joint swelling.  Gastrointestinal: Negative for abdominal pain, anorexia, change in bowel habit,  hematochezia and melena.  Neurological: Positive for dizziness. Negative for headaches and light-headedness.  Psychiatric/Behavioral: Negative for depression and substance abuse.  All other systems reviewed and are negative.  Objective   Vitals with BMI 01/22/2019 10/27/2018 07/23/2018  Height 5\' 5"  5\' 5"  5\' 5"   Weight 169 lbs 160 lbs 13 oz 161 lbs 5 oz  BMI 28.12 26.76 26.84  Systolic - 134 134  Diastolic - 71 72  Pulse - 78 58      Physical Exam  Constitutional:  She is moderately present and well-nourished in no acute distress.  HENT:  Head: Atraumatic.  Eyes: Conjunctivae are normal.  Neck: Neck supple. No JVD present. No thyromegaly present.  Cardiovascular: Normal rate, regular rhythm, normal heart sounds and intact distal pulses. Exam reveals no gallop.  No murmur heard. S2 is normally split but wide. No leg edema, no JVD.  Pulmonary/Chest: Effort normal and breath sounds normal.  Abdominal: Soft. Bowel sounds are normal.  Musculoskeletal: Normal range of motion.  Neurological: She is alert.  Skin: Skin is warm and dry.  Psychiatric: She has a normal mood and affect.    Laboratory examination:   06/01/2018-sodium-136, potassium-4.5, BUN-15, creatinine-0.84, glucose-98  05/28/2018-glucose is 88, BUN-12, creatinine-0.88, sodium-132, potassium is 4.4.  Normal liver enzymes.  TSH-1.0  Lipid Panel  05/28/2018-cholesterol-208, HDL-79, LDL-117, triglycerides-64.  No results for input(s): NA, K, CL, CO2, GLUCOSE, BUN, CREATININE, CALCIUM, GFRNONAA, GFRAA in the last 8760 hours. No flowsheet data found. No flowsheet data found. Lipid Panel  No results found for: CHOL, TRIG, HDL, CHOLHDL, VLDL, LDLCALC, LDLDIRECT HEMOGLOBIN A1C No results found for: HGBA1C, MPG TSH No results for input(s): TSH in the last 8760 hours. Medications and allergies  No Known Allergies   Prior to Admission medications   Medication Sig Start Date End Date Taking? Authorizing Provider   carboxymethylcellulose (REFRESH PLUS) 0.5 % SOLN Place 1 drop into both eyes 2 (two) times daily as needed (dry eyes).    [provider]  Cholecalciferol (VITAMIN D) 2000 UNITS CAPS Take 2,000 capsules by mouth. Occasional     [provider]  diltiazem (CARDIZEM CD) 180 MG 24 hr capsule Take 1 capsule (180 mg total) by mouth daily. 12/18/18   Yates Decamp, MD  FIBER PO Take by mouth 2 (two) times daily.    [provider]  guaiFENesin (MUCINEX) 600 MG 12 hr tablet Take 600 mg by mouth 2 (two) times daily as needed for to loosen phlegm.    [provider]  hydrALAZINE (APRESOLINE) 100 MG tablet Take 1 tablet (100 mg total) by mouth 3 (three) times daily. 08/24/18   Yates Decamp, MD  isosorbide mononitrate (IMDUR) 60 MG 24 hr tablet Take 1 tablet (60  mg total) by mouth daily. 12/26/18 03/26/19  Yates Decamp, MD  labetalol (NORMODYNE) 200 MG tablet Take 1 tablet (200 mg total) by mouth 2 (two) times daily. Patient not taking: Reported on 12/27/2018 12/26/18   Yates Decamp, MD  lisinopril (PRINIVIL,ZESTRIL) 40 MG tablet Take 40 mg by mouth daily.    [provider]  loratadine (CLARITIN) 10 MG tablet Take 10 mg by mouth daily as needed for allergies.    [provider]  Multiple Vitamin (MULTIVITAMIN WITH MINERALS) TABS tablet Take 1 tablet by mouth daily.    [provider]  Omega-3 Fatty Acids (FISH OIL) 1200 MG CAPS Take 1,200 mg by mouth daily.    [provider]     Current Outpatient Medications  Medication Instructions  . carboxymethylcellulose (REFRESH PLUS) 0.5 % SOLN 1 drop, Both Eyes, 2 times daily PRN  . Cholecalciferol (VITAMIN D) 2000 UNITS CAPS 2,000 capsules, Oral, Occasional   . diltiazem (CARDIZEM CD) 180 mg, Oral, Daily  . FIBER PO Oral, 2 times daily  . Fish Oil 1,200 mg, Oral, Daily  . guaiFENesin (MUCINEX) 600 mg, Oral, 2 times daily PRN  . hydrALAZINE (APRESOLINE) 50 mg, Oral, 3 times daily PRN  . loratadine  (CLARITIN) 10 mg, Oral, Daily PRN  . memantine (NAMENDA) 10 mg, Oral, 2 times daily  . Multiple Vitamin (MULTIVITAMIN WITH MINERALS) TABS tablet 1 tablet, Oral, Daily  . olmesartan (BENICAR) 40 mg, Oral, Every evening    Radiology:  No results found.  Cardiac Studies:   Event monitor 02/03/18- 02/27/2018: Sinus rhythm/arrhtymia w/occasional PAC. No other arrhtymias.  Lexiscan myoview stress test 02/11/2018: 1. Lexiscan stress test with low level exercise was performed. Patient reached 93% of the maximum predicted heart rate. No stress symptoms reported. Blood pressure was normal. Stress electrocardiogram showed sinus tachycardia, RBBB, no stress arrhythmias and normal stress repolarization. 2. The overall quality of the study is good. There is no evidence of abnormal lung activity. Stress and rest SPECT images demonstrate homogeneous tracer distribution throughout the myocardium. Gated SPECT imaging reveals normal myocardial thickening and wall motion. The left ventricular ejection fraction was normal (67%). 3. Low risk study.  Echo at [facility] of medicine [facility], [place]-01/05/2018- normal left ventricle systolic function, EF-60-65 percent. No regional wall motion abnormalities. Normal diastolic function. Trivial TR, no pulmonary hypertension.  Assessment     ICD-10-CM   1. Essential hypertension  I10 EKG 12-Lead    hydrALAZINE (APRESOLINE) 100 MG tablet    Basic Metabolic Panel (BMET)  2. Syncope and collapse  R55     EKG 01/22/2019: Normal sinus rhythm at rate of 65 bpm, left atrial enlargement, normal axis.  Right bundle branch block.  T wave abnormality in V1 to V4, probably repolarization abnormality, cannot exclude ischemia. No significant change from 10/27/2018. \ Meds ordered this encounter  Medications  . olmesartan (BENICAR) 40 MG tablet    Sig: Take 1 tablet (40 mg total) by mouth every evening.    Dispense:  30 tablet    Refill:  2    Discontinue Hydralazine and   Lisinopril  . hydrALAZINE (APRESOLINE) 100 MG tablet    Sig: Take 0.5 tablets (50 mg total) by mouth 3 (three) times daily as needed (SBP > 150 mm Hg).    Dispense:  270 tablet    Refill:  1    Recommendations:  TELISHA GERDES  is a 85 y.o. Fairly active African- American female with hypertension, mild hyperlipidemia, who is had multiple  episodes of lightheadedness, intermittent confusion, evaluated on 06/20/2018 in [place] ED, syncope on 01/04/2018 also sitting in the airport and evaluated at [facility], found to be hyponatremic and hypokalemic and dehydrated. Episode of syncope on 12/27/2018 after she had taken her antihypertensive medications, EMS found her to be mildly hypertensive with no EKG abnormality.  She is presently wearing an event monitor.  Still has one more week left.  Patient and her husband extremely cautious and very anxious about repeated recheck her blood pressure.  I advised him not to check the blood pressure unless she is not feeling well, I have discontinued both lisinopril and  Hydralazine.   Will start Benicar 40 mg daily.  Patient's daughter states that she developed severe hyponatremia with diuretics.  However that well-controlled her blood pressure previously, the could certainly look into this on her next office visit in 4 weeks.  She'll obtain a BMP in 2 weeks from now. I will change her hydralazine from 100 mg to 50 mg to be taken on a p.r.n. basis for systolic blood pressure greater than 150 mmHg.  She'll continue with diltiazem.  No significant EKG abnormality.  Yates Decamp, MD, [facility] 01/23/2019, 4:32 AM [facility] [place] Pager: [phone] Office: [phone] If no answer Cell [phone]

## 2019-01-28 DIAGNOSIS — I10 Essential (primary) hypertension: Secondary | ICD-10-CM | POA: Diagnosis not present

## 2019-01-28 DIAGNOSIS — H6123 Impacted cerumen, bilateral: Secondary | ICD-10-CM | POA: Diagnosis not present

## 2019-01-29 ENCOUNTER — Other Ambulatory Visit: Payer: Self-pay | Admitting: Cardiology

## 2019-01-29 DIAGNOSIS — I10 Essential (primary) hypertension: Secondary | ICD-10-CM

## 2019-02-05 DIAGNOSIS — I10 Essential (primary) hypertension: Secondary | ICD-10-CM | POA: Diagnosis not present

## 2019-02-06 LAB — BASIC METABOLIC PANEL
BUN/Creatinine Ratio: 19 (ref 12–28)
BUN: 17 mg/dL (ref 8–27)
CO2: 22 mmol/L (ref 20–29)
Calcium: 9.7 mg/dL (ref 8.7–10.3)
Chloride: 99 mmol/L (ref 96–106)
Creatinine, Ser: 0.91 mg/dL (ref 0.57–1.00)
GFR calc Af Amer: 68 mL/min/{1.73_m2} (ref >59)
GFR calc non Af Amer: 59 mL/min/{1.73_m2} — ABNORMAL LOW (ref >59)
Glucose: 89 mg/dL (ref 65–99)
Potassium: 4.3 mmol/L (ref 3.5–5.2)
Sodium: 139 mmol/L (ref 134–144)

## 2019-02-22 ENCOUNTER — Other Ambulatory Visit: Payer: Self-pay

## 2019-02-22 ENCOUNTER — Ambulatory Visit (INDEPENDENT_AMBULATORY_CARE_PROVIDER_SITE_OTHER): Payer: Medicare Other | Admitting: Cardiology

## 2019-02-22 ENCOUNTER — Encounter: Payer: Self-pay | Admitting: Cardiology

## 2019-02-22 VITALS — BP 186/85 | HR 69 | Temp 96.9°F | Ht 64.0 in | Wt 164.7 lb

## 2019-02-22 DIAGNOSIS — R55 Syncope and collapse: Secondary | ICD-10-CM | POA: Diagnosis not present

## 2019-02-22 DIAGNOSIS — I10 Essential (primary) hypertension: Secondary | ICD-10-CM

## 2019-02-22 MED ORDER — HYDRALAZINE HCL 25 MG PO TABS: 25.0000 mg | ORAL_TABLET | Freq: Two times a day (BID) | ORAL | 1 refills | Status: DC

## 2019-02-22 NOTE — Progress Notes (Signed)
Primary Physician/Referring:  Juluis Rainier, MD  Patient ID: April Douglas, female    DOB: 1934/01/21, 85 y.o.   MRN: [id number]  Chief Complaint  Patient presents with  . Hypertension  . Follow-up    4wk   HPI:    April Douglas  is a 85 y.o. Fairly active African- American female with hypertension, mild hyperlipidemia, who is had multiple episodes of lightheadedness, intermittent confusion, evaluated on 06/20/2018 in [place] ED, syncope on 01/04/2018 also sitting in the airport and evaluated at [facility], found to be hyponatremic and hypokalemic and dehydrated.  She had been doing well but has noticed fluctuation in blood pressure.    Patient has had syncopal episode on 12/27/2018 and noted to be hypotensive during that time.  EKG was without abnormalities.  She had been having significant fluctuations in variations in her blood pressure.  At her last office visit, Labetalol, Imdur, and lisinopril were discontinued at her last office visit.  She was started on Benicar 40 mg daily.  She was instructed to use hydralazine 50 mg on a as needed basis for systolic blood greater than 150.  She now presents for follow-up.  She has not had any recurrence of syncope or near syncope.  Overall has been feeling well.  Her blood pressure has improved, but she still does notice some fluctuations.  She is noted elevated pressures particularly in the morning.  She is tolerating hydralazine 50 mg as needed well.  Her daughter had reported severe hyponatremia with diuretics in the past.  She did wear a 30-day event monitor was without any arrhythmias.  BP has been elevated in the mornings in the 170 range, generally much better in the afternoons in the 130 systolic range.  Past Medical History:  Diagnosis Date  . Dehydration   . Dizziness   . Elevated serum creatinine   . Glaucoma   . Hyperlipidemia   . Hypertension   . Right bundle branch block   . Seasonal affective  disorder [facility])    Past Surgical History:  Procedure Laterality Date  . ABDOMINAL HYSTERECTOMY     TAH-BSO  . CATARACT EXTRACTION Bilateral   . COLONOSCOPY WITH PROPOFOL N/A 02/06/2015   Procedure: COLONOSCOPY WITH PROPOFOL;  Surgeon: Charolett Bumpers, MD;  Location: [facility] ENDOSCOPY;  Service: Endoscopy;  Laterality: N/A;  . tooth extraction  2020   Social History   Socioeconomic History  . Marital status: Married    Spouse name: Not on file  . Number of children: 2  . Years of education: some college  . Highest education level: Not on file  Occupational History    Comment: retired  Engineer, production  . Financial resource strain: Not on file  . Food insecurity    Worry: Not on file    Inability: Not on file  . Transportation needs    Medical: Not on file    Non-medical: Not on file  Tobacco Use  . Smoking status: Never Smoker  . Smokeless tobacco: Never Used  Substance and Sexual Activity  . Alcohol use: No  . Drug use: No  . Sexual activity: Not on file  Lifestyle  . Physical activity    Days per week: Not on file    Minutes per session: Not on file  . Stress: Not on file  Relationships  . Social Musician on phone: Not on file    Gets together: Not on file    Attends  religious service: Not on file    Active member of club or organization: Not on file    Attends meetings of clubs or organizations: Not on file    Relationship status: Not on file  . Intimate partner violence    Fear of current or ex partner: Not on file    Emotionally abused: Not on file    Physically abused: Not on file    Forced sexual activity: Not on file  Other Topics Concern  . Not on file  Social History Narrative   Caffeine- coffee, 1 daily.  Lives home with husband-[name].  Education 3 yrs college.  Retired.     ROS  Review of Systems  Constitution: Negative for chills, decreased appetite, malaise/fatigue and weight gain.  Cardiovascular: Negative for dyspnea on exertion, leg  swelling and syncope.  Endocrine: Negative for cold intolerance.  Hematologic/Lymphatic: Does not bruise/bleed easily.  Musculoskeletal: Negative for joint swelling.  Gastrointestinal: Negative for abdominal pain, anorexia, change in bowel habit, hematochezia and melena.  Neurological: Positive for dizziness. Negative for headaches and light-headedness.  Psychiatric/Behavioral: Negative for depression and substance abuse.  All other systems reviewed and are negative.  Objective   Vitals with BMI 02/22/2019 01/22/2019 10/27/2018  Height 5\' 4"  5\' 5"  5\' 5"   Weight 164 lbs 11 oz 169 lbs 160 lbs 13 oz  BMI 28.26 28.12 26.76  Systolic 186 - 134  Diastolic 85 - 71  Pulse 69 - 78      Physical Exam  Constitutional:  She is moderately present and well-nourished in no acute distress.  HENT:  Head: Atraumatic.  Eyes: Conjunctivae are normal.  Neck: Neck supple. No JVD present. No thyromegaly present.  Cardiovascular: Normal rate, regular rhythm, normal heart sounds and intact distal pulses. Exam reveals no gallop.  No murmur heard. S2 is normally split but wide. No leg edema, no JVD.  Pulmonary/Chest: Effort normal and breath sounds normal.  Abdominal: Soft. Bowel sounds are normal.  Musculoskeletal: Normal range of motion.  Neurological: She is alert.  Skin: Skin is warm and dry.  Psychiatric: She has a normal mood and affect.    Laboratory examination:   06/01/2018-sodium-136, potassium-4.5, BUN-15, creatinine-0.84, glucose-98  05/28/2018-glucose is 88, BUN-12, creatinine-0.88, sodium-132, potassium is 4.4.  Normal liver enzymes.  TSH-1.0  Lipid Panel  05/28/2018-cholesterol-208, HDL-79, LDL-117, triglycerides-64.  Recent Labs    02/05/19 1334  NA 139  K 4.3  CL 99  CO2 22  GLUCOSE 89  BUN 17  CREATININE 0.91  CALCIUM 9.7  GFRNONAA 59*  GFRAA 68   CMP Latest Ref Rng & Units 02/05/2019  Glucose 65 - 99 mg/dL 89  BUN 8 - 27 mg/dL 17  Creatinine 6.96 - 2.95 mg/dL 2.84   Sodium 132 - 440 mmol/L 139  Potassium 3.5 - 5.2 mmol/L 4.3  Chloride 96 - 106 mmol/L 99  CO2 20 - 29 mmol/L 22  Calcium 8.7 - 10.3 mg/dL 9.7   No flowsheet data found. Lipid Panel  No results found for: CHOL, TRIG, HDL, CHOLHDL, VLDL, LDLCALC, LDLDIRECT HEMOGLOBIN A1C No results found for: HGBA1C, MPG TSH No results for input(s): TSH in the last 8760 hours. Medications and allergies  No Known Allergies   Prior to Admission medications   Medication Sig Start Date End Date Taking? Authorizing Provider  carboxymethylcellulose (REFRESH PLUS) 0.5 % SOLN Place 1 drop into both eyes 2 (two) times daily as needed (dry eyes).    [provider]  Cholecalciferol (VITAMIN D) 2000 UNITS  CAPS Take 2,000 capsules by mouth. Occasional     [provider]  diltiazem (CARDIZEM CD) 180 MG 24 hr capsule Take 1 capsule (180 mg total) by mouth daily. 12/18/18   Yates Decamp, MD  FIBER PO Take by mouth 2 (two) times daily.    [provider]  guaiFENesin (MUCINEX) 600 MG 12 hr tablet Take 600 mg by mouth 2 (two) times daily as needed for to loosen phlegm.    [provider]  hydrALAZINE (APRESOLINE) 100 MG tablet Take 1 tablet (100 mg total) by mouth 3 (three) times daily. 08/24/18   Yates Decamp, MD  isosorbide mononitrate (IMDUR) 60 MG 24 hr tablet Take 1 tablet (60 mg total) by mouth daily. 12/26/18 03/26/19  Yates Decamp, MD  labetalol (NORMODYNE) 200 MG tablet Take 1 tablet (200 mg total) by mouth 2 (two) times daily. Patient not taking: Reported on 12/27/2018 12/26/18   Yates Decamp, MD  lisinopril (PRINIVIL,ZESTRIL) 40 MG tablet Take 40 mg by mouth daily.    [provider]  loratadine (CLARITIN) 10 MG tablet Take 10 mg by mouth daily as needed for allergies.    [provider]  Multiple Vitamin (MULTIVITAMIN WITH MINERALS) TABS tablet Take 1 tablet by mouth daily.    [provider]  Omega-3 Fatty Acids (FISH OIL) 1200 MG CAPS Take 1,200 mg by mouth  daily.    [provider]     Current Outpatient Medications  Medication Instructions  . acetaminophen (TYLENOL) 500 mg, Oral, Every 6 hours PRN  . carboxymethylcellulose (REFRESH PLUS) 0.5 % SOLN 1 drop, Both Eyes, 2 times daily PRN  . diltiazem (CARDIZEM CD) 180 mg, Oral, Daily  . FIBER PO Oral, As needed  . guaiFENesin (MUCINEX) 600 mg, Oral, 2 times daily PRN  . hydrALAZINE (APRESOLINE) 25 mg, Oral, 2 times daily  . Ketotifen Fumarate (ZADITOR OP) Ophthalmic, As needed  . loratadine (CLARITIN) 10 mg, Oral, Daily PRN  . memantine (NAMENDA) 10 mg, Oral, 2 times daily  . Multiple Vitamin (MULTIVITAMIN WITH MINERALS) TABS tablet 1 tablet, Oral, Daily  . olmesartan (BENICAR) 40 mg, Oral, Every evening  . Omega-3 Fatty Acids (SALMON OIL-1000 PO) Oral, Daily    Radiology:  No results found.  Cardiac Studies:   Event monitor 12/28/2018 through 01/26/2019 for syncope: Normal sinus rhythm. No patient activated events. No heart block or atrial fibrillation.  Event monitor 02/03/18- 02/27/2018: Sinus rhythm/arrhtymia w/occasional PAC. No other arrhtymias.  Lexiscan myoview stress test 02/11/2018: 1. Lexiscan stress test with low level exercise was performed. Patient reached 93% of the maximum predicted heart rate. No stress symptoms reported. Blood pressure was normal. Stress electrocardiogram showed sinus tachycardia, RBBB, no stress arrhythmias and normal stress repolarization. 2. The overall quality of the study is good. There is no evidence of abnormal lung activity. Stress and rest SPECT images demonstrate homogeneous tracer distribution throughout the myocardium. Gated SPECT imaging reveals normal myocardial thickening and wall motion. The left ventricular ejection fraction was normal (67%). 3. Low risk study.  Echo at [facility], [place]-01/05/2018- normal left ventricle systolic function, EF-60-65 percent. No regional wall motion abnormalities.  Normal diastolic function. Trivial TR, no pulmonary hypertension.  Assessment     ICD-10-CM   1. Essential hypertension  I10   2. Syncope and collapse  R55     EKG 01/22/2019: Normal sinus rhythm at rate of 65 bpm, left atrial enlargement, normal axis.  Right bundle branch block.  T wave abnormality in  V1 to V4, probably repolarization abnormality, cannot exclude ischemia. No significant change from 10/27/2018. \ Meds ordered this encounter  Medications  . hydrALAZINE (APRESOLINE) 25 MG tablet    Sig: Take 1 tablet (25 mg total) by mouth 2 (two) times daily.    Dispense:  60 tablet    Refill:  1    Order Specific Question:   Supervising Provider    Answer:   Yates Decamp [[id number]]    Recommendations:  April Douglas  is a 85 y.o. Fairly active African- American female with hypertension, mild hyperlipidemia, who is had multiple episodes of lightheadedness, intermittent confusion, evaluated on 06/20/2018 in [place] ED, syncope on 01/04/2018 also sitting in the airport and evaluated at [facility], found to be hyponatremic and hypokalemic and dehydrated. Episode of syncope on 12/27/2018 after she had taken her antihypertensive medications, EMS found her to be mildly hypotensive with no EKG abnormality.    She has not had any recurrence of syncope.  She has had improvement in fluctuations in her blood pressure with present medications, but does continue to have elevated blood pressures particularly in the morning.  She had previously been on hydralazine 100 mg 3 times daily.  She has upper last few weeks and using hydralazine 50 mg on an as-needed basis and tolerates this well.  Her blood pressure is much better controlled in the afternoons with only rare occasions of elevations.  We will continue with her current medications, but will start hydralazine 25 mg twice daily.  If she has elevations in her systolic blood pressure to greater than 150, may take additional dose.   Encouraged her to stay well-hydrated.  Kidney function and electrolytes are stable by recent blood work.  Event monitor was without any arrhythmias.  If she continues to have recurrent episodes of syncope, could potentially consider loop recorder if suspicion for arrhythmia is high.  We will plan to see her back in 6 weeks for follow-up.  Plan of care was discussed with her daughter who is present at bedside and she is in agreements with the plan.  April Fail, April Douglas, April Douglas, April Douglas [facility]. [place] Office: [phone] Fax: [phone]  If no answer Cell [phone]

## 2019-03-02 ENCOUNTER — Ambulatory Visit: Payer: Medicare Other | Admitting: Cardiology

## 2019-03-18 ENCOUNTER — Other Ambulatory Visit: Payer: Self-pay | Admitting: Cardiology

## 2019-03-18 NOTE — Telephone Encounter (Signed)
Please read

## 2019-03-24 DIAGNOSIS — D485 Neoplasm of uncertain behavior of skin: Secondary | ICD-10-CM | POA: Diagnosis not present

## 2019-03-24 DIAGNOSIS — Z23 Encounter for immunization: Secondary | ICD-10-CM | POA: Diagnosis not present

## 2019-04-05 ENCOUNTER — Ambulatory Visit: Payer: Medicare Other | Admitting: Cardiology

## 2019-04-08 ENCOUNTER — Ambulatory Visit: Payer: Medicare Other | Attending: Internal Medicine

## 2019-04-08 DIAGNOSIS — J309 Allergic rhinitis, unspecified: Secondary | ICD-10-CM | POA: Diagnosis not present

## 2019-04-08 DIAGNOSIS — Z23 Encounter for immunization: Secondary | ICD-10-CM | POA: Insufficient documentation

## 2019-04-08 DIAGNOSIS — R350 Frequency of micturition: Secondary | ICD-10-CM | POA: Diagnosis not present

## 2019-04-08 DIAGNOSIS — R413 Other amnesia: Secondary | ICD-10-CM | POA: Diagnosis not present

## 2019-04-08 NOTE — Progress Notes (Signed)
Covid-19 Vaccination Clinic  Name:  SOOJIN FARAHANI    MRN: [id number] DOB: Dec 15, 1933  04/08/2019  Ms. Boyarsky was observed post Covid-19 immunization for 15 minutes without incidence. She was provided with Vaccine Information Sheet and instruction to access the V-Safe system.   Ms. Goon was instructed to call 911 with any severe reactions post vaccine: Marland Kitchen Difficulty breathing  . Swelling of your face and throat  . A fast heartbeat  . A bad rash all over your body  . Dizziness and weakness    Immunizations Administered    Name Date Dose VIS Date Route   Pfizer COVID-19 Vaccine 04/08/2019 10:35 AM 0.3 mL 02/26/2019 Intramuscular   Manufacturer: [employer], [employer]   Lot: [id number]   NDC: [address]-5

## 2019-04-09 ENCOUNTER — Ambulatory Visit: Payer: Medicare Other | Admitting: Cardiology

## 2019-04-15 ENCOUNTER — Encounter: Payer: Self-pay | Admitting: Cardiology

## 2019-04-15 ENCOUNTER — Ambulatory Visit (INDEPENDENT_AMBULATORY_CARE_PROVIDER_SITE_OTHER): Payer: Medicare Other | Admitting: Cardiology

## 2019-04-15 ENCOUNTER — Other Ambulatory Visit: Payer: Self-pay

## 2019-04-15 VITALS — BP 157/78 | HR 67 | Temp 97.7°F | Resp 16 | Ht 64.0 in | Wt 168.0 lb

## 2019-04-15 DIAGNOSIS — R55 Syncope and collapse: Secondary | ICD-10-CM | POA: Diagnosis not present

## 2019-04-15 DIAGNOSIS — I10 Essential (primary) hypertension: Secondary | ICD-10-CM | POA: Diagnosis not present

## 2019-04-15 MED ORDER — HYDRALAZINE HCL 100 MG PO TABS: 100.0000 mg | ORAL_TABLET | Freq: Three times a day (TID) | ORAL | 3 refills | Status: DC

## 2019-04-15 NOTE — Progress Notes (Signed)
Primary Physician/Referring:  Juluis Rainier, MD  Patient ID: April Douglas, female    DOB: 27-Dec-1933, 86 y.o.   MRN: [id number]  Chief Complaint  Patient presents with  . Hypertension  . Follow-up    6 weeks   HPI:    April Douglas  is a 86 y.o. Fairly active African- American female with hypertension, mild hyperlipidemia, who is had multiple episodes of lightheadedness, intermittent confusion, evaluated on 06/20/2018 in [place] ED, syncope on 01/04/2018 also sitting in the airport and evaluated at [facility], found to be hyponatremic and hypokalemic and dehydrated.  Since her last office visit, hydralazine was increased to 50 mg BID. She has noted elevated readings in the morning. Blood pressure is better controlled in evenings.   Patient has had syncopal episode on 12/27/2018 and noted to be hypotensive during that time.  EKG was without abnormalities.  She had been having significant fluctuations in variations in her blood pressure. After her last office visit, her hydralazine was increased to 50 mg. She is tolerating this well, but does continue to have elevations in her BP first thing in the morning.   She has not had any recurrence of syncope or near syncope. She has had severe hyponatremia with diuretics in the past.  She did wear a 30-day event monitor was without any arrhythmias.  BP has been elevated in the mornings in the 170 range, generally much better in the afternoons in the 130 systolic range.  Past Medical History:  Diagnosis Date  . Dehydration   . Dizziness   . Elevated serum creatinine   . Glaucoma   . Hyperlipidemia   . Hypertension   . Right bundle branch block   . Seasonal affective disorder [facility])    Past Surgical History:  Procedure Laterality Date  . ABDOMINAL HYSTERECTOMY     TAH-BSO  . CATARACT EXTRACTION Bilateral   . COLONOSCOPY WITH PROPOFOL N/A 02/06/2015   Procedure: COLONOSCOPY WITH PROPOFOL;  Surgeon: Charolett Bumpers, MD;  Location: [facility] ENDOSCOPY;  Service: Endoscopy;  Laterality: N/A;  . tooth extraction  2020   Social History   Socioeconomic History  . Marital status: Married    Spouse name: Not on file  . Number of children: 2  . Years of education: some college  . Highest education level: Not on file  Occupational History    Comment: retired  Tobacco Use  . Smoking status: Never Smoker  . Smokeless tobacco: Never Used  Substance and Sexual Activity  . Alcohol use: No  . Drug use: No  . Sexual activity: Not on file  Other Topics Concern  . Not on file  Social History Narrative   Caffeine- coffee, 1 daily.  Lives home with husband-[name].  Education 3 yrs college.  Retired.     Social Determinants of Health   Financial Resource Strain:   . Difficulty of Paying Living Expenses: Not on file  Food Insecurity:   . Worried About Programme researcher, broadcasting/film/video in the Last Year: Not on file  . Ran Out of Food in the Last Year: Not on file  Transportation Needs:   . Lack of Transportation (Medical): Not on file  . Lack of Transportation (Non-Medical): Not on file  Physical Activity:   . Days of Exercise per Week: Not on file  . Minutes of Exercise per Session: Not on file  Stress:   . Feeling of Stress : Not on file  Social Connections:   .  Frequency of Communication with Friends and Family: Not on file  . Frequency of Social Gatherings with Friends and Family: Not on file  . Attends Religious Services: Not on file  . Active Member of Clubs or Organizations: Not on file  . Attends Banker Meetings: Not on file  . Marital Status: Not on file  Intimate Partner Violence:   . Fear of Current or Ex-Partner: Not on file  . Emotionally Abused: Not on file  . Physically Abused: Not on file  . Sexually Abused: Not on file   ROS  Review of Systems  Constitution: Negative for chills, decreased appetite, malaise/fatigue and weight gain.  Cardiovascular: Negative for dyspnea on  exertion, leg swelling and syncope.  Endocrine: Negative for cold intolerance.  Hematologic/Lymphatic: Does not bruise/bleed easily.  Musculoskeletal: Negative for joint swelling.  Gastrointestinal: Negative for abdominal pain, anorexia, change in bowel habit, hematochezia and melena.  Neurological: Positive for dizziness. Negative for headaches and light-headedness.  Psychiatric/Behavioral: Negative for depression and substance abuse.  All other systems reviewed and are negative.  Objective   Vitals with BMI 04/15/2019 02/22/2019 01/22/2019  Height 5\' 4"  5\' 4"  5\' 5"   Weight 168 lbs 164 lbs 11 oz 169 lbs  BMI 28.82 28.26 28.12  Systolic 157 186 -  Diastolic 78 85 -  Pulse 67 69 -      Physical Exam  Constitutional:  She is moderately present and well-nourished in no acute distress.  HENT:  Head: Atraumatic.  Eyes: Conjunctivae are normal.  Neck: No JVD present. No thyromegaly present.  Cardiovascular: Normal rate, regular rhythm, normal heart sounds and intact distal pulses. Exam reveals no gallop.  No murmur heard. S2 is normally split but wide. No leg edema, no JVD.  Pulmonary/Chest: Effort normal and breath sounds normal.  Abdominal: Soft. Bowel sounds are normal.  Musculoskeletal:        General: Normal range of motion.     Cervical back: Neck supple.  Neurological: She is alert.  Skin: Skin is warm and dry.  Psychiatric: She has a normal mood and affect.    Laboratory examination:   06/01/2018-sodium-136, potassium-4.5, BUN-15, creatinine-0.84, glucose-98  05/28/2018-glucose is 88, BUN-12, creatinine-0.88, sodium-132, potassium is 4.4.  Normal liver enzymes.  TSH-1.0  Lipid Panel  05/28/2018-cholesterol-208, HDL-79, LDL-117, triglycerides-64.  Recent Labs    02/05/19 1334  NA 139  K 4.3  CL 99  CO2 22  GLUCOSE 89  BUN 17  CREATININE 0.91  CALCIUM 9.7  GFRNONAA 59*  GFRAA 68   CMP Latest Ref Rng & Units 02/05/2019  Glucose 65 - 99 mg/dL 89  BUN 8 - 27  mg/dL 17  Creatinine 7.25 - 3.66 mg/dL 4.40  Sodium 347 - 425 mmol/L 139  Potassium 3.5 - 5.2 mmol/L 4.3  Chloride 96 - 106 mmol/L 99  CO2 20 - 29 mmol/L 22  Calcium 8.7 - 10.3 mg/dL 9.7   No flowsheet data found. Lipid Panel  No results found for: CHOL, TRIG, HDL, CHOLHDL, VLDL, LDLCALC, LDLDIRECT HEMOGLOBIN A1C No results found for: HGBA1C, MPG TSH No results for input(s): TSH in the last 8760 hours. Medications and allergies  No Known Allergies   Prior to Admission medications   Medication Sig Start Date End Date Taking? Authorizing Provider  carboxymethylcellulose (REFRESH PLUS) 0.5 % SOLN Place 1 drop into both eyes 2 (two) times daily as needed (dry eyes).    [provider]  Cholecalciferol (VITAMIN D) 2000 UNITS CAPS Take 2,000 capsules by  mouth. Occasional     [provider]  diltiazem (CARDIZEM CD) 180 MG 24 hr capsule Take 1 capsule (180 mg total) by mouth daily. 12/18/18   Yates Decamp, MD  FIBER PO Take by mouth 2 (two) times daily.    [provider]  guaiFENesin (MUCINEX) 600 MG 12 hr tablet Take 600 mg by mouth 2 (two) times daily as needed for to loosen phlegm.    [provider]  hydrALAZINE (APRESOLINE) 100 MG tablet Take 1 tablet (100 mg total) by mouth 3 (three) times daily. 08/24/18   Yates Decamp, MD  isosorbide mononitrate (IMDUR) 60 MG 24 hr tablet Take 1 tablet (60 mg total) by mouth daily. 12/26/18 03/26/19  Yates Decamp, MD  labetalol (NORMODYNE) 200 MG tablet Take 1 tablet (200 mg total) by mouth 2 (two) times daily. Patient not taking: Reported on 12/27/2018 12/26/18   Yates Decamp, MD  lisinopril (PRINIVIL,ZESTRIL) 40 MG tablet Take 40 mg by mouth daily.    [provider]  loratadine (CLARITIN) 10 MG tablet Take 10 mg by mouth daily as needed for allergies.    [provider]  Multiple Vitamin (MULTIVITAMIN WITH MINERALS) TABS tablet Take 1 tablet by mouth daily.    [provider]  Omega-3 Fatty Acids  (FISH OIL) 1200 MG CAPS Take 1,200 mg by mouth daily.    [provider]     Current Outpatient Medications  Medication Instructions  . acetaminophen (TYLENOL) 500 mg, Oral, Every 6 hours PRN  . carboxymethylcellulose (REFRESH PLUS) 0.5 % SOLN 1 drop, Both Eyes, 2 times daily PRN  . diltiazem (CARTIA XT) 180 mg, Oral, Daily  . guaiFENesin (MUCINEX) 600 mg, Oral, 2 times daily PRN  . hydrALAZINE (APRESOLINE) 100 mg, Oral, 3 times daily  . Ketotifen Fumarate (ZADITOR OP) Ophthalmic, As needed  . loratadine (CLARITIN) 10 mg, Oral, Daily PRN  . memantine (NAMENDA) 10 mg, Oral, 2 times daily  . Multiple Vitamin (MULTIVITAMIN WITH MINERALS) TABS tablet 1 tablet, Oral, Daily  . olmesartan (BENICAR) 40 MG tablet TAKE 1 TABLET BY MOUTH ONCE DAILY IN THE EVENING  . Omega-3 Fatty Acids (SALMON OIL-1000 PO) Oral, Daily    Radiology:  No results found.  Cardiac Studies:   Event monitor 12/28/2018 through 01/26/2019 for syncope: Normal sinus rhythm. No patient activated events. No heart block or atrial fibrillation.  Event monitor 02/03/18- 02/27/2018: Sinus rhythm/arrhtymia w/occasional PAC. No other arrhtymias.  Lexiscan myoview stress test 02/11/2018: 1. Lexiscan stress test with low level exercise was performed. Patient reached 93% of the maximum predicted heart rate. No stress symptoms reported. Blood pressure was normal. Stress electrocardiogram showed sinus tachycardia, RBBB, no stress arrhythmias and normal stress repolarization. 2. The overall quality of the study is good. There is no evidence of abnormal lung activity. Stress and rest SPECT images demonstrate homogeneous tracer distribution throughout the myocardium. Gated SPECT imaging reveals normal myocardial thickening and wall motion. The left ventricular ejection fraction was normal (67%). 3. Low risk study.  Echo at [facility] [place]-01/05/2018- normal left ventricle systolic function, EF-60-65  percent. No regional wall motion abnormalities. Normal diastolic function. Trivial TR, no pulmonary hypertension.  Assessment     ICD-10-CM   1. Essential hypertension  I10   2. Syncope and collapse  R55     EKG 01/22/2019: Normal sinus rhythm at rate of 65 bpm, left atrial enlargement, normal axis.  Right bundle branch block.  T wave abnormality in V1 to V4, probably  repolarization abnormality, cannot exclude ischemia. No significant change from 10/27/2018. \ Meds ordered this encounter  Medications  . hydrALAZINE (APRESOLINE) 100 MG tablet    Sig: Take 1 tablet (100 mg total) by mouth 3 (three) times daily.    Dispense:  90 tablet    Refill:  3    Order Specific Question:   Supervising Provider    Answer:   Yates Decamp [[id number]]    Recommendations:  April Douglas  is a 86 y.o. Fairly active African- American female with hypertension, mild hyperlipidemia, who is had multiple episodes of lightheadedness, intermittent confusion, evaluated on 06/20/2018 in [place] ED, syncope on 01/04/2018 also sitting in the airport and evaluated at [facility], found to be hyponatremic and hypokalemic and dehydrated. Episode of syncope on 12/27/2018 after she had taken her antihypertensive medications, EMS found her to be mildly hypotensive with no EKG abnormality.    Blood pressure has overall improved since she has been back on hydralazine at 50 mg, but does continue to have elevations in the morning around 160 range.  We will further increase her hydralazine to 100 mg twice daily.  She is overall feeling well, has not had any recurrence of syncope or near syncope.  Would avoid diuretic use in view of her history of hyponatremia.  Encouraged her to continue with low-sodium diet and regular exercise.  We will plan to see her back in 8 weeks for follow-up on hypertension.  Toniann Fail, MSN, APRN, FNP-C [facility]. [place] Office: [phone] Fax: [phone]

## 2019-04-21 DIAGNOSIS — D225 Melanocytic nevi of trunk: Secondary | ICD-10-CM | POA: Diagnosis not present

## 2019-04-21 DIAGNOSIS — L811 Chloasma: Secondary | ICD-10-CM | POA: Diagnosis not present

## 2019-04-21 DIAGNOSIS — D239 Other benign neoplasm of skin, unspecified: Secondary | ICD-10-CM | POA: Diagnosis not present

## 2019-04-21 DIAGNOSIS — L723 Sebaceous cyst: Secondary | ICD-10-CM | POA: Diagnosis not present

## 2019-04-21 DIAGNOSIS — Z23 Encounter for immunization: Secondary | ICD-10-CM | POA: Diagnosis not present

## 2019-04-21 DIAGNOSIS — L821 Other seborrheic keratosis: Secondary | ICD-10-CM | POA: Diagnosis not present

## 2019-04-29 ENCOUNTER — Ambulatory Visit: Payer: Medicare Other | Attending: Internal Medicine

## 2019-04-29 DIAGNOSIS — Z23 Encounter for immunization: Secondary | ICD-10-CM | POA: Insufficient documentation

## 2019-04-29 NOTE — Progress Notes (Signed)
Covid-19 Vaccination Clinic  Name:  LOCHLYNN KUSEK    MRN: [id number] DOB: 16-Jan-1934  04/29/2019  Ms. Hrbek was observed post Covid-19 immunization for 15 minutes without incidence. She was provided with Vaccine Information Sheet and instruction to access the V-Safe system.   Ms. Drill was instructed to call 911 with any severe reactions post vaccine: Marland Kitchen Difficulty breathing  . Swelling of your face and throat  . A fast heartbeat  . A bad rash all over your body  . Dizziness and weakness    Immunizations Administered    Name Date Dose VIS Date Route   Pfizer COVID-19 Vaccine 04/29/2019 11:27 AM 0.3 mL 02/26/2019 Intramuscular   Manufacturer: [employer], [employer]   Lot: [id number]   NDC: [address]-2

## 2019-05-09 ENCOUNTER — Telehealth: Payer: Self-pay | Admitting: Cardiology

## 2019-05-09 DIAGNOSIS — R55 Syncope and collapse: Secondary | ICD-10-CM | POA: Diagnosis not present

## 2019-05-09 DIAGNOSIS — R531 Weakness: Secondary | ICD-10-CM | POA: Diagnosis not present

## 2019-05-09 DIAGNOSIS — I4510 Unspecified right bundle-branch block: Secondary | ICD-10-CM | POA: Diagnosis not present

## 2019-05-09 NOTE — Telephone Encounter (Signed)
Patient was at home doing virtual church visit and was sitting on the sofa watching the service and as soon as it was ended, she suddenly passed out on the sofa and also had bladder incontinence.  Patient's husband was present next to her, no seizure disorder, no postictal state, patient immediately woke up and was aware of the situation.  I also spoke to the patient and again no explanation of sudden onset syncope.  No chest pain, no shortness of breath, last episode was in September 2020. BP has been well controlled, no diarrhea or vomiting. NO recent fever or chills.  I explained to her that probable syncope etiology includes heart block given her age, advised her that we should proceed with implantation of a loop recorder.  I explained to her regarding the risks and benefits of loop recorder and also to her daughter of minimal risk of trauma, bleeding, infection and need for explantation of the device due to infection.  Patient wants to proceed with loop implantation.  I will try to set this up in the outpatient basis. I spent 15 minutes on the phone with her daughter this afternoon around 1 pm and again called patient around 5:15 pm and spoke to her directly as well.     ICD-10-CM   1. Syncope and collapse  R55     Adrian Prows, MD, [facility] 05/09/2019, 5:21 PM [facility] [place] Office: [phone]

## 2019-05-17 HISTORY — PX: LOOP RECORDER IMPLANT: SHX5954

## 2019-05-21 ENCOUNTER — Other Ambulatory Visit: Payer: Self-pay

## 2019-05-21 ENCOUNTER — Ambulatory Visit: Payer: Medicare Other | Admitting: Cardiology

## 2019-05-21 ENCOUNTER — Encounter: Payer: Self-pay | Admitting: Cardiology

## 2019-05-21 VITALS — BP 150/75 | HR 69 | Temp 94.0°F | Ht 64.0 in | Wt 168.0 lb

## 2019-05-21 DIAGNOSIS — R002 Palpitations: Secondary | ICD-10-CM

## 2019-05-21 DIAGNOSIS — R55 Syncope and collapse: Secondary | ICD-10-CM | POA: Diagnosis not present

## 2019-05-21 DIAGNOSIS — I10 Essential (primary) hypertension: Secondary | ICD-10-CM

## 2019-05-21 MED ORDER — OLMESARTAN MEDOXOMIL 40 MG PO TABS: 40.0000 mg | ORAL_TABLET | Freq: Every evening | ORAL | 3 refills | Status: DC

## 2019-05-21 NOTE — Patient Instructions (Signed)
Implantable Loop Recorder Placement, Care After  PLEASE REMOVE THE SURGICAL TAPE AFTER TWO DAYS AND APPLY REGULAR BANDAID DAILY UNTIL YOUR RETURN TO OFFICE FOR FOLLOW UP.  This sheet gives you information about how to care for yourself after your procedure. Your health care provider may also give you more specific instructions. If you have problems or questions, contact your health care provider. What can I expect after the procedure? After the procedure, it is common to have:  Soreness or discomfort near the incision.  Some swelling or bruising near the incision. Follow these instructions at home: Incision care   Follow instructions from your health care provider about how to take care of your incision. Make sure you: ? Wash your hands with soap and water before you change your bandage (dressing). If soap and water are not available, use hand sanitizer. ? Change your dressing as told by your health care provider. ? Keep your dressing dry. ? Leave stitches (sutures), skin glue, or adhesive strips in place. These skin closures may need to stay in place for 2 weeks or longer. If adhesive strip edges start to loosen and curl up, you may trim the loose edges. Do not remove adhesive strips completely unless your health care provider tells you to do that.  Check your incision area every day for signs of infection. Check for: ? Redness, swelling, or pain. ? Fluid or blood. ? Warmth. ? Pus or a bad smell.  Do not take baths, swim, or use a hot tub until your health care provider approves. Ask your health care provider if you can take showers. Activity   Return to your normal activities as told by your health care provider. Ask your health care provider what activities are safe for you.  Do not drive for 24 hours if you were given a sedative during your procedure. General instructions  Follow instructions from your health care provider about how to manage your implantable loop recorder and  transmit the information. Learn how to activate a recording if this is necessary for your type of device.  Do not go through a metal detection gate, and do not let someone hold a metal detector over your chest. Show your ID card.  Do not have an MRI unless you check with your health care provider first.  Take over-the-counter and prescription medicines only as told by your health care provider.  Keep all follow-up visits as told by your health care provider. This is important. Contact a health care provider if:  You have redness, swelling, or pain around your incision.  You have a fever.  You have pain that is not relieved by your pain medicine.  You have triggered your device because of fainting (syncope) or because of a heartbeat that feels like it is racing, slow, fluttering, or skipping (palpitations). Get help right away if you have:  Chest pain.  Difficulty breathing. Summary  After the procedure, it is common to have soreness or discomfort near the incision.  Change your dressing as told by your health care provider.  Follow instructions from your health care provider about how to manage your implantable loop recorder and transmit the information.  Keep all follow-up visits as told by your health care provider. This is important. This information is not intended to replace advice given to you by your health care provider. Make sure you discuss any questions you have with your health care provider. Document Revised: 04/19/2017 Document Reviewed: 04/19/2017 Elsevier Patient Education  2020 Elsevier  Inc.  

## 2019-05-21 NOTE — Progress Notes (Signed)
Patient with recurrent syncope, presents here for loop recorder implantation, all risks, benefits and alternatives have been discussed with the patient.  She is willing to proceed.   Prodedure performed: Insertion of Biotronik Loop Recorder. Serial [id number]; reference: [id number]  Indication: Syncope and collapse  Blood pressure (!) 150/75, pulse 69, temperature (!) 94 F (34.4 C), height 5\' 4"  (1.626 m), weight 168 lb (76.2 kg).   After obtaining informed consent, explaining the procedure to the patient, under sterile precautions, local anesthesia with 1% lidocaine with epinephrine was injected subcutaneously in the left parasternal region.  20 mL utilized.  A small nick was made in the left paraspinal region at the 3rd intercostal space. Biotronik loop recorder was inserted without any complications.  Patient tolerated the procedure well.  The incision was closed with Steri-Strips.  There is no blood loss, no hematoma.  Written instrtuctions regarding wound care given to the patient.  Device setting:  R wave amplitude 0.50-0.67 mV.    Programmed to  AF detection to Medium.  HVR 160/min.  Bradycardia 50 BPM Asystole 3 Sec        ICD-10-CM   1. Syncope and collapse  R55   2. Palpitations  R00.2   3. Essential hypertension  I10      Wound care instructions given to the patient, I have refilled her prescription for olmesartan.  I will see her back in 1 week for follow-up of hypertension.  Adrian Prows, MD, [facility] 05/21/2019, 12:49 PM [place] [place] Office: [phone]

## 2019-05-31 DIAGNOSIS — R413 Other amnesia: Secondary | ICD-10-CM | POA: Diagnosis not present

## 2019-05-31 DIAGNOSIS — Z0000 Encounter for general adult medical examination without abnormal findings: Secondary | ICD-10-CM | POA: Diagnosis not present

## 2019-05-31 DIAGNOSIS — I10 Essential (primary) hypertension: Secondary | ICD-10-CM | POA: Diagnosis not present

## 2019-05-31 DIAGNOSIS — Z8639 Personal history of other endocrine, nutritional and metabolic disease: Secondary | ICD-10-CM | POA: Diagnosis not present

## 2019-05-31 DIAGNOSIS — E782 Mixed hyperlipidemia: Secondary | ICD-10-CM | POA: Diagnosis not present

## 2019-06-07 ENCOUNTER — Ambulatory Visit: Payer: Medicare Other | Admitting: Cardiology

## 2019-06-08 ENCOUNTER — Other Ambulatory Visit: Payer: Self-pay

## 2019-06-08 ENCOUNTER — Ambulatory Visit: Payer: Medicare Other | Admitting: Cardiology

## 2019-06-08 ENCOUNTER — Encounter: Payer: Self-pay | Admitting: Cardiology

## 2019-06-08 VITALS — BP 170/80 | HR 99 | Temp 98.5°F | Ht 64.0 in | Wt 174.2 lb

## 2019-06-08 DIAGNOSIS — I10 Essential (primary) hypertension: Secondary | ICD-10-CM

## 2019-06-08 DIAGNOSIS — Z95818 Presence of other cardiac implants and grafts: Secondary | ICD-10-CM

## 2019-06-08 DIAGNOSIS — Z87898 Personal history of other specified conditions: Secondary | ICD-10-CM

## 2019-06-08 HISTORY — DX: Presence of other cardiac implants and grafts: Z95.818

## 2019-06-08 MED ORDER — HYDRALAZINE HCL 100 MG PO TABS: 100.0000 mg | ORAL_TABLET | Freq: Two times a day (BID) | ORAL | 3 refills | Status: DC

## 2019-06-08 NOTE — Progress Notes (Signed)
Chief Complaint  Patient presents with  . Hypertension    8 week follow up    HPI  April Douglas is a 86 y.o. female who presents to the office with a chief complaint of " blood pressure management/follow-up." Patient's past medical history and cardiac risk factors include: Hypertension, hyperlipidemia, history of syncope, postmenopausal female, advanced age.  Patient is accompanied by her husband at today's office visit.  Patient has a known history of hypertension and is currently on pharmacological therapy.  In addition, patient has multiple episodes of syncope in the past due to various etiologies.  Would refer you to prior progress notes for additional details.  She recently was seen in the office for implantation of loop recorder and has done well since then.  Patient brings her blood pressure log in for review.  Her systolic blood pressures predominantly range between 120 to 138 mmHg but does have 1 or 2 readings of her SBP in the 140s and 150s.  Her medications have been reconciled.  She has not had any near syncope or syncopal events since last office visit.  ALLERGIES: No Known Allergies   MEDICATION LIST PRIOR TO VISIT: Current Outpatient Medications on File Prior to Visit  Medication Sig Dispense Refill  . acetaminophen (TYLENOL) 500 MG tablet Take 500 mg by mouth every 6 (six) hours as needed.    . carboxymethylcellulose (REFRESH PLUS) 0.5 % SOLN Place 1 drop into both eyes 2 (two) times daily as needed (dry eyes).    Marland Kitchen diltiazem (CARTIA XT) 180 MG 24 hr capsule Take 180 mg by mouth daily.    Marland Kitchen guaiFENesin (MUCINEX) 600 MG 12 hr tablet Take 600 mg by mouth 2 (two) times daily as needed for to loosen phlegm.    Marland Kitchen Ketotifen Fumarate (ZADITOR OP) Apply to eye as needed.    . loratadine (CLARITIN) 10 MG tablet Take 10 mg by mouth daily as needed for allergies.    . memantine (NAMENDA) 10 MG tablet Take 10 mg by mouth 2 (two) times daily.    . Multiple Vitamin (MULTIVITAMIN WITH  MINERALS) TABS tablet Take 1 tablet by mouth daily.    Marland Kitchen olmesartan (BENICAR) 40 MG tablet Take 1 tablet (40 mg total) by mouth every evening. 90 tablet 3  . Omega-3 Fatty Acids (SALMON OIL-1000 PO) Take by mouth daily.     No current facility-administered medications on file prior to visit.    PAST MEDICAL HISTORY: Past Medical History:  Diagnosis Date  . Dehydration   . Dizziness   . Elevated serum creatinine   . Glaucoma   . Hyperlipidemia   . Hypertension   . Right bundle branch block   . Seasonal affective disorder ([place])     PAST SURGICAL HISTORY: Past Surgical History:  Procedure Laterality Date  . ABDOMINAL HYSTERECTOMY     TAH-BSO  . CATARACT EXTRACTION Bilateral   . COLONOSCOPY WITH PROPOFOL N/A 02/06/2015   Procedure: COLONOSCOPY WITH PROPOFOL;  Surgeon: Garlan Fair, MD;  Location: [facility];  Service: Endoscopy;  Laterality: N/A;  . LOOP RECORDER IMPLANT  05/2019  . tooth extraction  2020    FAMILY HISTORY: The patient family history includes Cancer in her brother and brother; Heart disease in her brother; Hypertension in her father, mother, and sister; Stroke in her sister.   SOCIAL HISTORY:  The patient  reports that she has never smoked. She has never used smokeless tobacco. She reports that she does not drink alcohol or  use drugs.  14 ORGAN REVIEW OF SYSTEMS: CONSTITUTIONAL: No fever or significant weight loss EYES: No recent significant visual change EARS, NOSE, MOUTH, THROAT: No recent significant change in hearing CARDIOVASCULAR: See discussion in subjective/HPI RESPIRATORY: See discussion in subjective/HPI GASTROINTESTINAL: No recent complaints of abdominal pain GENITOURINARY: No recent significant change in genitourinary status MUSCULOSKELETAL: No recent significant change in musculoskeletal status INTEGUMENTARY: No recent rash NEUROLOGIC: No recent significant change in motor function PSYCHIATRIC: No recent significant change in  mood ENDOCRINOLOGIC: No recent significant change in endocrine status HEMATOLOGIC/LYMPHATIC: No recent significant unexpected bruising ALLERGIC/IMMUNOLOGIC: No recent unexplained allergic reaction  PHYSICAL EXAM: Vitals with BMI 06/08/2019 06/08/2019 05/21/2019  Height - 5\' 4"  5\' 4"   Weight - 174 lbs 3 oz 168 lbs  BMI - 99991111 [id number]  Systolic [id number] [id number]  Diastolic 80 91 75  Pulse 99 58 69    CONSTITUTIONAL: Well-developed and well-nourished. No acute distress.  SKIN: Skin is warm and dry. No rash noted. No cyanosis. No pallor. No jaundice HEAD: Normocephalic and atraumatic.  EYES: No scleral icterus MOUTH/THROAT: Moist oral membranes.  NECK: No JVD present. No thyromegaly noted. No carotid bruits  LYMPHATIC: No visible cervical adenopathy.  CHEST Normal respiratory effort. No intercostal retractions  LUNGS: Clear to auscultation bilaterally.  No stridor. No wheezes. No rales.  CARDIOVASCULAR: Regular rate and rhythm, positive S1-S2, no murmurs rubs or gallops appreciated.  The loop recorder site is healing well no signs of infection, site is clean dry and intact. ABDOMINAL: No apparent ascites.  EXTREMITIES: No peripheral edema  HEMATOLOGIC: No significant bruising NEUROLOGIC: Oriented to person, place, and time. Nonfocal. Normal muscle tone.  PSYCHIATRIC: Normal mood and affect. Normal behavior. Cooperative  CARDIAC DATABASE: EKG  01/22/2019: Normal sinus rhythm at rate of 65 bpm, left atrial enlargement, normal axis.  Right bundle branch block.  T wave abnormality in V1 to V4, probably repolarization abnormality, cannot exclude ischemia. 06/08/2019: Sinus bradycardia with a ventricular rate of 55 bpm, normal axis, incomplete right bundle branch block, left atrial enlargement, nonspecific T wave abnormality.    Event monitor 12/28/2018 through 01/26/2019 for syncope: Normal sinus rhythm. No patient activated events. No heart block or atrial fibrillation.  Event monitor 02/03/18-  02/27/2018: Sinus rhythm/arrhtymia w/occasional PAC. No other arrhtymias.  Lexiscan myoview stress test 02/11/2018: 1. Lexiscan stress test with low level exercise was performed. Patient reached 93% of the maximum predicted heart rate. No stress symptoms reported. Blood pressure was normal. Stress electrocardiogram showed sinus tachycardia, RBBB, no stress arrhythmias and normal stress repolarization. 2. The overall quality of the study is good. There is no evidence of abnormal lung activity. Stress and rest SPECT images demonstrate homogeneous tracer distribution throughout the myocardium. Gated SPECT imaging reveals normal myocardial thickening and wall motion. The left ventricular ejection fraction was normal (67%). 3. Low risk study.  Echo at [facility], [place]-01/05/2018- normal left ventricle systolic function, [id number] percent. No regional wall motion abnormalities. Normal diastolic function. Trivial TR, no pulmonary hypertension.  LABORATORY DATA: No flowsheet data found.  CMP Latest Ref Rng & Units 02/05/2019  Glucose 65 - 99 mg/dL 89  BUN 8 - 27 mg/dL 17  Creatinine 0.57 - 1.00 mg/dL 0.91  Sodium 134 - 144 mmol/L 139  Potassium 3.5 - 5.2 mmol/L 4.3  Chloride 96 - 106 mmol/L 99  CO2 20 - 29 mmol/L 22  Calcium 8.7 - 10.3 mg/dL 9.7   Lipid Panel  05/28/2018-cholesterol-208, HDL-79, LDL-117, triglycerides-64.  FINAL MEDICATION LIST  END OF ENCOUNTER: Meds ordered this encounter  Medications  . hydrALAZINE (APRESOLINE) 100 MG tablet    Sig: Take 1 tablet (100 mg total) by mouth 2 (two) times daily.    Dispense:  90 tablet    Refill:  3    Medications Discontinued During This Encounter  Medication Reason  . hydrALAZINE (APRESOLINE) 100 MG tablet      Current Outpatient Medications:  .  acetaminophen (TYLENOL) 500 MG tablet, Take 500 mg by mouth every 6 (six) hours as needed., Disp: , Rfl:  .  carboxymethylcellulose (REFRESH PLUS) 0.5 % SOLN, Place  1 drop into both eyes 2 (two) times daily as needed (dry eyes)., Disp: , Rfl:  .  diltiazem (CARTIA XT) 180 MG 24 hr capsule, Take 180 mg by mouth daily., Disp: , Rfl:  .  guaiFENesin (MUCINEX) 600 MG 12 hr tablet, Take 600 mg by mouth 2 (two) times daily as needed for to loosen phlegm., Disp: , Rfl:  .  hydrALAZINE (APRESOLINE) 100 MG tablet, Take 1 tablet (100 mg total) by mouth 2 (two) times daily., Disp: 90 tablet, Rfl: 3 .  Ketotifen Fumarate (ZADITOR OP), Apply to eye as needed., Disp: , Rfl:  .  loratadine (CLARITIN) 10 MG tablet, Take 10 mg by mouth daily as needed for allergies., Disp: , Rfl:  .  memantine (NAMENDA) 10 MG tablet, Take 10 mg by mouth 2 (two) times daily., Disp: , Rfl:  .  Multiple Vitamin (MULTIVITAMIN WITH MINERALS) TABS tablet, Take 1 tablet by mouth daily., Disp: , Rfl:  .  olmesartan (BENICAR) 40 MG tablet, Take 1 tablet (40 mg total) by mouth every evening., Disp: 90 tablet, Rfl: 3 .  Omega-3 Fatty Acids (SALMON OIL-1000 PO), Take by mouth daily., Disp: , Rfl:   IMPRESSION:    ICD-10-CM   1. Essential hypertension  I10 EKG 12-Lead  2. History of syncope  Z87.898   3. Status post placement of implantable loop recorder  Z95.818     RECOMMENDATIONS: MAELANI JAMES is a 86 y.o. female whose past medical history and cardiac risk factors include: Hypertension, hyperlipidemia, history of syncope, postmenopausal female, advanced age.  Benign essential hypertension:  Patient's blood pressures in the office are not well controlled.  Right arm was 170/90 and left arm was 170/80 mmHg.  However, her home blood pressures are very controlled as noted above in the setting of prior syncopal events.  We will further titrate antihypertensive medications based on her home blood pressure readings.  I have asked the patient and her husband to call the office if she has blood pressure readings consistently greater than 140 mmHg. . Medication reconciled.  . If the blood pressure is  consistently greater than 153mmHg patient is asked to call the office to for medication titration sooner than the next office visit.  . Low salt diet recommended. A diet that is rich in fruits, vegetables, legumes, and low-fat dairy products and low in snacks, sweets, and meats (such as the Dietary Approaches to Stop Hypertension [DASH] diet).   History of syncope:  Since last office visit no new syncopal events.  Patient recently had a loop recorder implantation.  Will obtain records and the device readings are currently being followed by Dr. Christen Butter.  Continue to monitor symptoms.  Orders Placed This Encounter  Procedures  . EKG 12-Lead  Evaluation and management (44min) with time spent obtaining history, performing exam, reviewing studies, greater than 50% of that time was spent in counseling  and coordination care with the patient regarding complex decision making and discussion as state above, and documenting clinical evaluation.  --Continue cardiac medications as reconciled in final medication list. --Return in about 6 months (around 12/09/2019) for BP management . Or sooner if needed. --Continue follow-up with your primary care physician regarding the management of your other chronic comorbid conditions.  Patient's questions and concerns were addressed to her satisfaction. She voices understanding of the instructions provided during this encounter.   This note was created using a voice recognition software as a result there may be grammatical errors inadvertently enclosed that do not reflect the nature of this encounter. Every attempt is made to correct such errors.  Rex Kras, DO, [facility]. [place] Office: [phone]

## 2019-06-21 ENCOUNTER — Other Ambulatory Visit: Payer: Self-pay

## 2019-06-21 DIAGNOSIS — Z95818 Presence of other cardiac implants and grafts: Secondary | ICD-10-CM | POA: Diagnosis not present

## 2019-06-21 DIAGNOSIS — R55 Syncope and collapse: Secondary | ICD-10-CM | POA: Diagnosis not present

## 2019-06-21 DIAGNOSIS — Z4509 Encounter for adjustment and management of other cardiac device: Secondary | ICD-10-CM | POA: Diagnosis not present

## 2019-06-21 MED ORDER — DILTIAZEM HCL ER COATED BEADS 180 MG PO CP24: 180.0000 mg | ORAL_CAPSULE | Freq: Every day | ORAL | 2 refills | Status: DC

## 2019-06-27 ENCOUNTER — Encounter: Payer: Self-pay | Admitting: Cardiology

## 2019-06-27 DIAGNOSIS — Z4509 Encounter for adjustment and management of other cardiac device: Secondary | ICD-10-CM

## 2019-06-27 HISTORY — DX: Encounter for adjustment and management of other cardiac device: Z45.09

## 2019-07-13 DIAGNOSIS — H40023 Open angle with borderline findings, high risk, bilateral: Secondary | ICD-10-CM | POA: Diagnosis not present

## 2019-07-13 DIAGNOSIS — H1045 Other chronic allergic conjunctivitis: Secondary | ICD-10-CM | POA: Diagnosis not present

## 2019-07-13 DIAGNOSIS — Z961 Presence of intraocular lens: Secondary | ICD-10-CM | POA: Diagnosis not present

## 2019-07-13 DIAGNOSIS — H04123 Dry eye syndrome of bilateral lacrimal glands: Secondary | ICD-10-CM | POA: Diagnosis not present

## 2019-08-02 DIAGNOSIS — R350 Frequency of micturition: Secondary | ICD-10-CM | POA: Diagnosis not present

## 2019-08-02 DIAGNOSIS — N390 Urinary tract infection, site not specified: Secondary | ICD-10-CM | POA: Diagnosis not present

## 2019-08-26 DIAGNOSIS — R55 Syncope and collapse: Secondary | ICD-10-CM | POA: Diagnosis not present

## 2019-08-26 DIAGNOSIS — Z4509 Encounter for adjustment and management of other cardiac device: Secondary | ICD-10-CM | POA: Diagnosis not present

## 2019-08-26 DIAGNOSIS — Z95818 Presence of other cardiac implants and grafts: Secondary | ICD-10-CM | POA: Diagnosis not present

## 2019-08-31 ENCOUNTER — Telehealth: Payer: Self-pay

## 2019-09-15 ENCOUNTER — Other Ambulatory Visit: Payer: Self-pay

## 2019-09-15 MED ORDER — DILTIAZEM HCL ER COATED BEADS 180 MG PO CP24: 180.0000 mg | ORAL_CAPSULE | Freq: Every day | ORAL | 6 refills | Status: DC

## 2019-09-16 ENCOUNTER — Other Ambulatory Visit: Payer: Self-pay | Admitting: Family Medicine

## 2019-09-16 DIAGNOSIS — Z1231 Encounter for screening mammogram for malignant neoplasm of breast: Secondary | ICD-10-CM

## 2019-09-27 DIAGNOSIS — Z95818 Presence of other cardiac implants and grafts: Secondary | ICD-10-CM | POA: Diagnosis not present

## 2019-09-27 DIAGNOSIS — Z4509 Encounter for adjustment and management of other cardiac device: Secondary | ICD-10-CM | POA: Diagnosis not present

## 2019-09-27 DIAGNOSIS — R55 Syncope and collapse: Secondary | ICD-10-CM | POA: Diagnosis not present

## 2019-10-17 IMAGING — MG DIGITAL SCREENING BILATERAL MAMMOGRAM WITH TOMO AND CAD
6 of 10 series · 6 of 30 positions shown · non-contrast
Comparison: Previous exam(s).

CLINICAL DATA: Screening.

EXAM:
DIGITAL SCREENING BILATERAL MAMMOGRAM WITH TOMO AND CAD

[R MLO synth-2D]
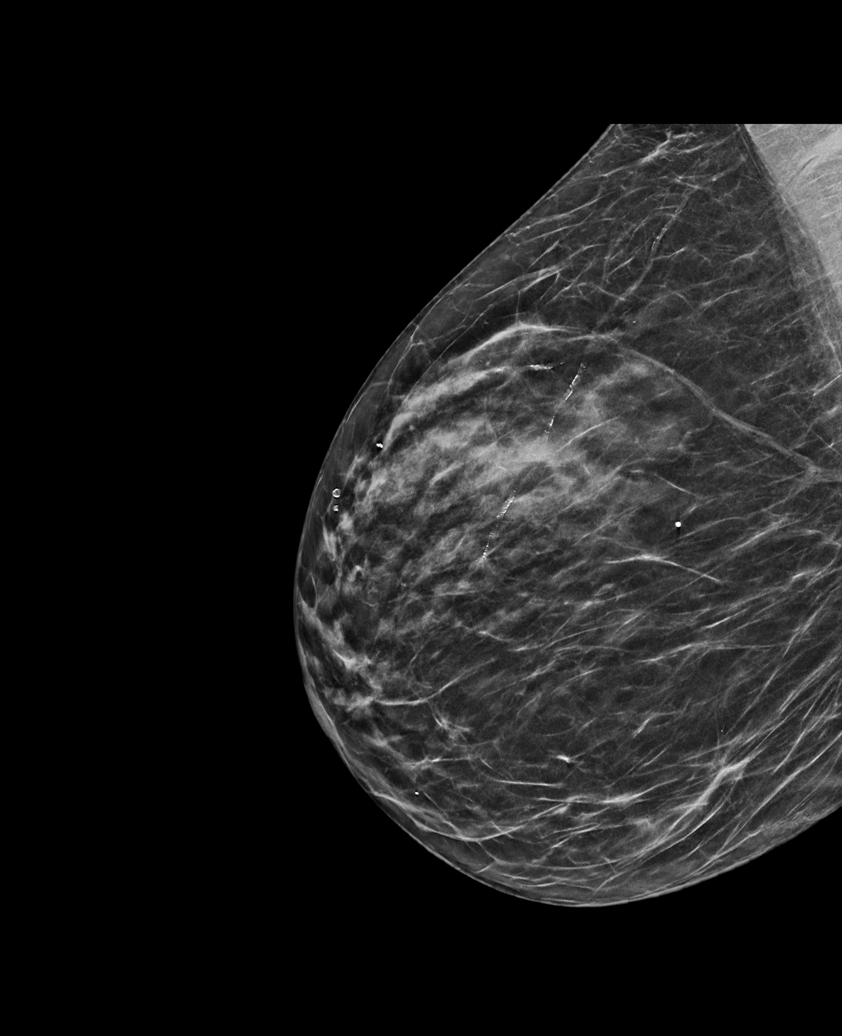

[L MLO synth-2D (1 of 2)]
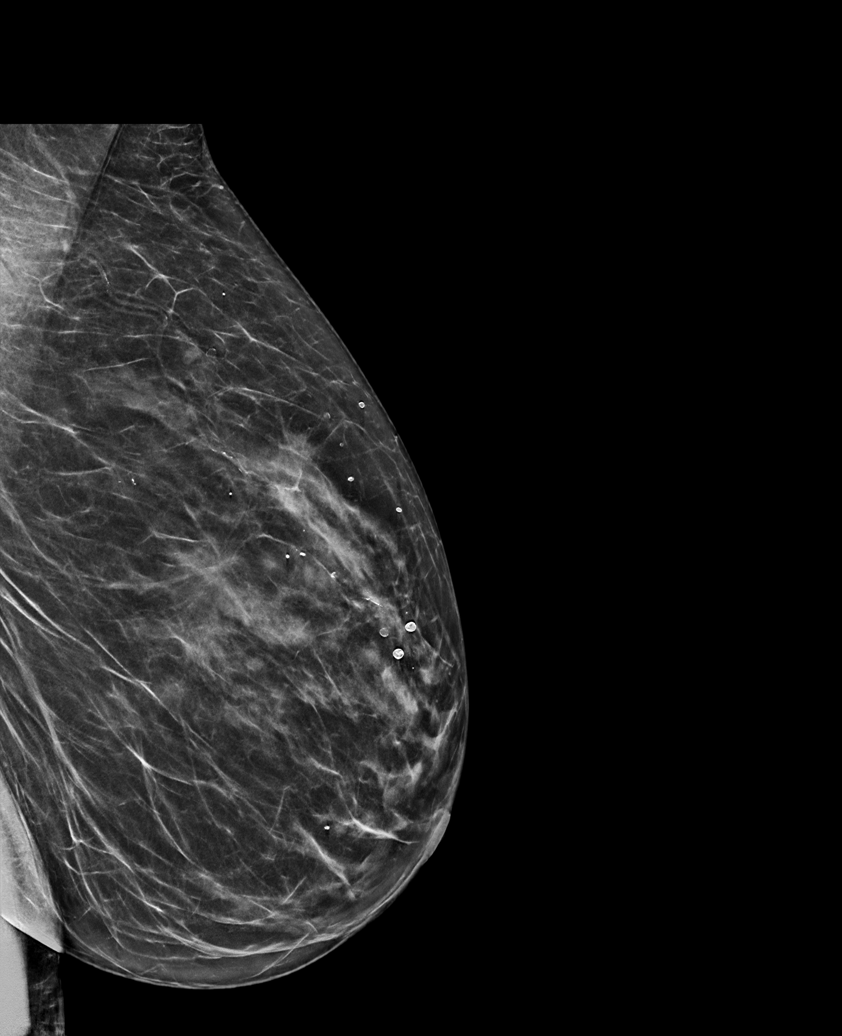

[L MLO synth-2D (2 of 2)]
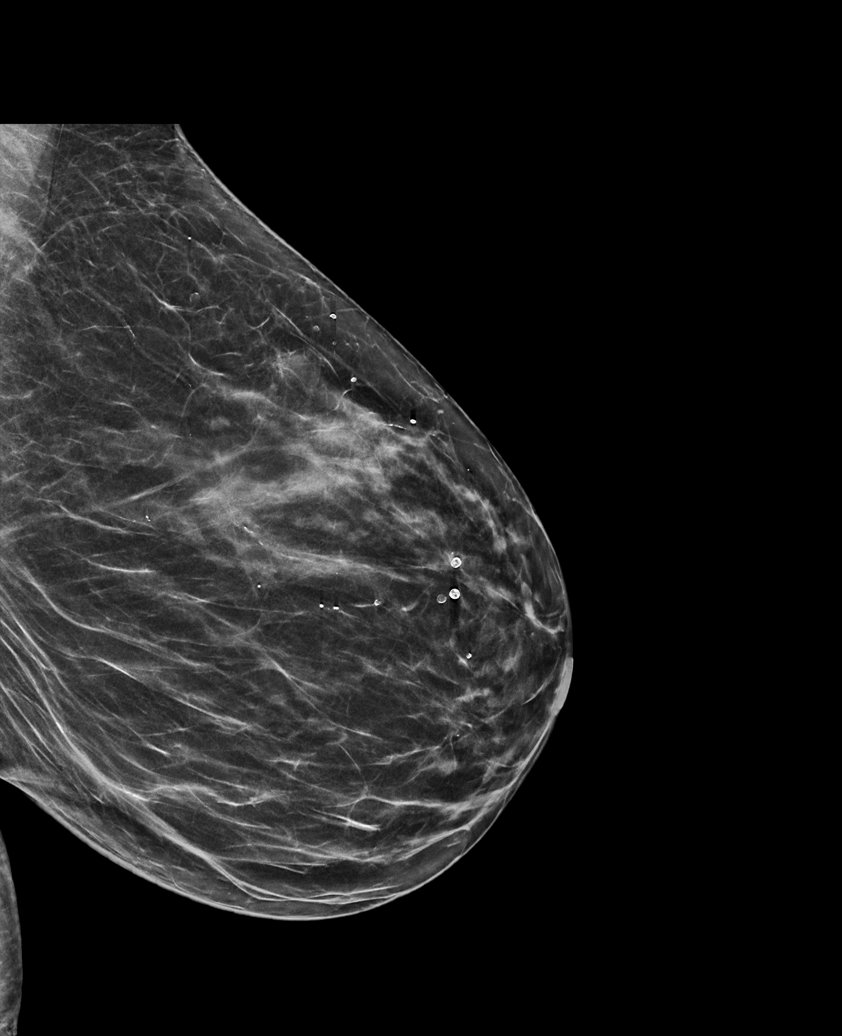

[L CC synth-2D]
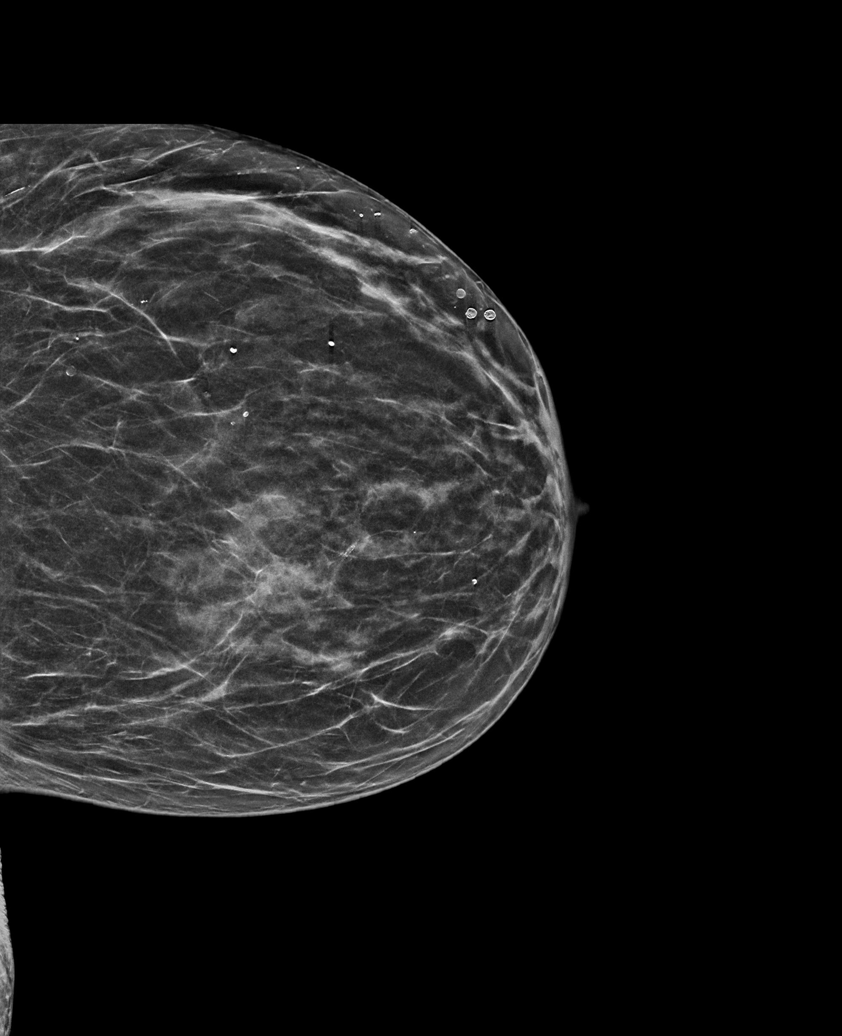

[R CC synth-2D]
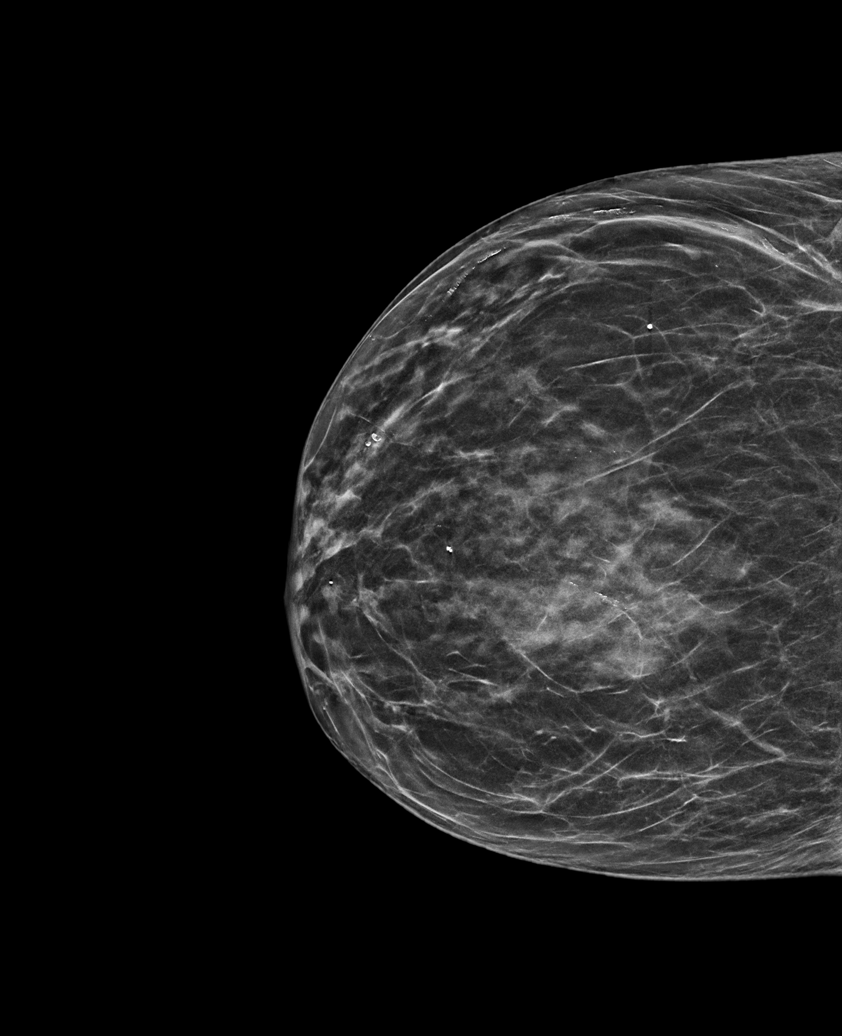

[R CC tomo · tomo slice 29/56.0]
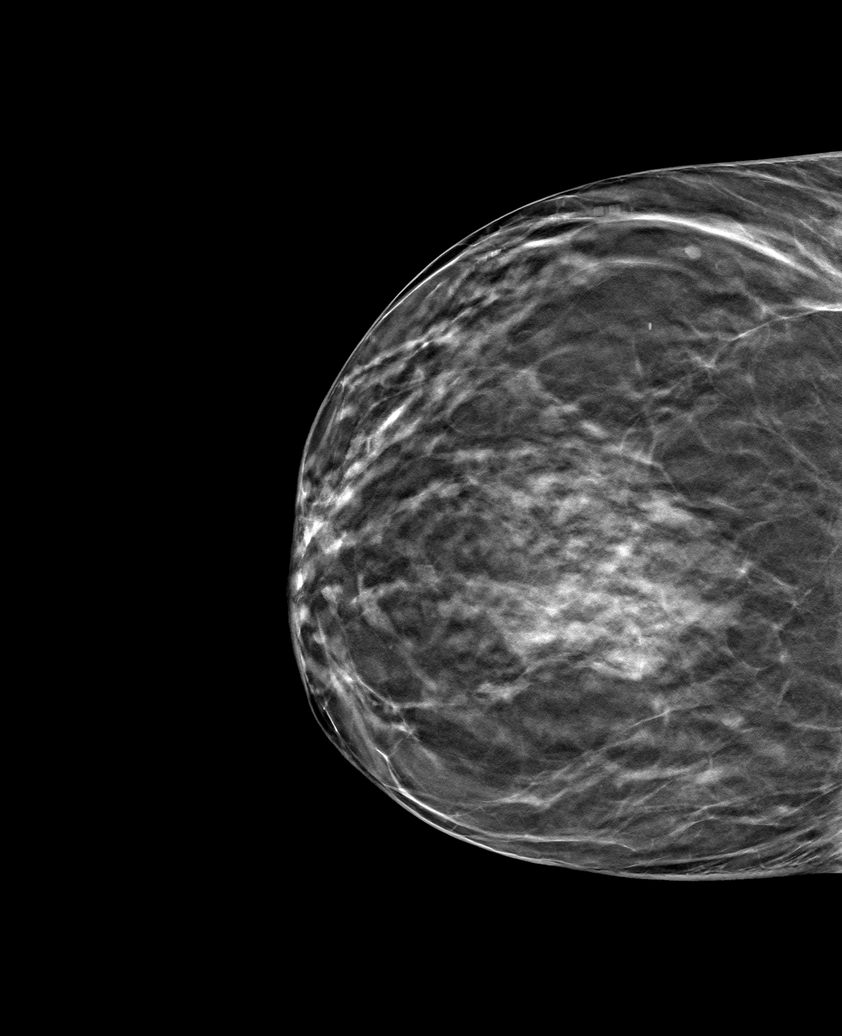

[6 of 30 positions shown; findings below may reference images not displayed]

ACR Breast Density Category b: There are scattered areas of
fibroglandular density.
FINDINGS: There are no findings suspicious for malignancy. Images were
processed with CAD.
IMPRESSION: No mammographic evidence of malignancy. A result letter of this
screening mammogram will be mailed directly to the patient.

RECOMMENDATION:
Screening mammogram in one year. (Code:[id number])

BI-RADS CATEGORY  1: Negative.

## 2019-10-20 ENCOUNTER — Other Ambulatory Visit: Payer: Self-pay

## 2019-10-20 ENCOUNTER — Ambulatory Visit
Admission: RE | Admit: 2019-10-20 | Discharge: 2019-10-20 | Disposition: A | Discharge status: Home / Self Care | Payer: Medicare Other | Source: Ambulatory Visit | Attending: Family Medicine | Admitting: Family Medicine

## 2019-10-20 DIAGNOSIS — Z1231 Encounter for screening mammogram for malignant neoplasm of breast: Secondary | ICD-10-CM

## 2019-11-02 ENCOUNTER — Ambulatory Visit: Payer: Medicare Other | Admitting: Cardiology

## 2019-11-04 DIAGNOSIS — M795 Residual foreign body in soft tissue: Secondary | ICD-10-CM | POA: Diagnosis not present

## 2019-11-04 DIAGNOSIS — R1310 Dysphagia, unspecified: Secondary | ICD-10-CM | POA: Diagnosis not present

## 2019-11-05 DIAGNOSIS — T17208A Unspecified foreign body in pharynx causing other injury, initial encounter: Secondary | ICD-10-CM | POA: Diagnosis not present

## 2019-11-18 ENCOUNTER — Ambulatory Visit: Payer: Medicare Other | Admitting: Cardiology

## 2019-11-18 ENCOUNTER — Encounter: Payer: Self-pay | Admitting: Cardiology

## 2019-11-18 ENCOUNTER — Other Ambulatory Visit: Payer: Self-pay

## 2019-11-18 VITALS — BP 160/88 | HR 74 | Resp 16 | Ht 64.0 in | Wt 171.0 lb

## 2019-11-18 DIAGNOSIS — Z87898 Personal history of other specified conditions: Secondary | ICD-10-CM | POA: Diagnosis not present

## 2019-11-18 DIAGNOSIS — Z95818 Presence of other cardiac implants and grafts: Secondary | ICD-10-CM

## 2019-11-18 DIAGNOSIS — I10 Essential (primary) hypertension: Secondary | ICD-10-CM | POA: Diagnosis not present

## 2019-11-18 MED ORDER — HYDRALAZINE HCL 100 MG PO TABS: 100.0000 mg | ORAL_TABLET | Freq: Two times a day (BID) | ORAL | 3 refills | Status: DC

## 2019-11-18 NOTE — Progress Notes (Signed)
Chief Complaint  Patient presents with   Hypertension   Follow-up    39month    Date: 11/18/19 Last Office Visit: 06/08/2019  HPI  April Douglas is a 86 y.o. female who presents to the office with a chief complaint of " blood pressure management/follow-up." Patient's past medical history and cardiac risk factors include: Hypertension, hyperlipidemia, history of syncope, postmenopausal female, advanced age.  Patient is accompanied by her husband at today's office visit.  Patient has a known history of hypertension and is currently on pharmacological therapy.  In addition, patient has multiple episodes of syncope in the past due to various etiologies.  She has undergone implantation of loop recorder and has done well since then.  Most recent device interrogation report reviewed with the patient at today's office visit.  Patient's home blood pressures are very well controlled.  Her systolic blood pressures usually range between 120-166mmHg. Home blood pressure log reviewed.    Since last office visit no hospitalizations or urgent care visits for cardiovascular symptoms. No recurrence of syncopal events.  ALLERGIES: No Known Allergies   MEDICATION LIST PRIOR TO VISIT: Current Outpatient Medications on File Prior to Visit  Medication Sig Dispense Refill   acetaminophen (TYLENOL) 500 MG tablet Take 500 mg by mouth every 6 (six) hours as needed.     carboxymethylcellulose (REFRESH PLUS) 0.5 % SOLN Place 1 drop into both eyes 2 (two) times daily as needed (dry eyes).     diltiazem (CARTIA XT) 180 MG 24 hr capsule Take 1 capsule (180 mg total) by mouth daily. 30 capsule 6   guaiFENesin (MUCINEX) 600 MG 12 hr tablet Take 600 mg by mouth 2 (two) times daily as needed for to loosen phlegm.     Ketotifen Fumarate (ZADITOR OP) Apply to eye as needed.     loratadine (CLARITIN) 10 MG tablet Take 10 mg by mouth daily as needed for allergies.     memantine (NAMENDA) 10 MG tablet Take 10 mg by  mouth 2 (two) times daily.     Multiple Vitamin (MULTIVITAMIN WITH MINERALS) TABS tablet Take 1 tablet by mouth daily.     olmesartan (BENICAR) 40 MG tablet Take 1 tablet (40 mg total) by mouth every evening. 90 tablet 3   Omega-3 Fatty Acids (SALMON OIL-1000 PO) Take by mouth daily.     Turmeric (QC TUMERIC COMPLEX PO) Take 1 tablet by mouth daily.     No current facility-administered medications on file prior to visit.    PAST MEDICAL HISTORY: Past Medical History:  Diagnosis Date   Dehydration    Dizziness    Elevated serum creatinine    Encounter for loop recorder check 06/27/2019   Glaucoma    Hyperlipidemia    Hypertension    Loop recorder [place]ron[place] 05/21/19 06/08/2019   Right bundle branch block    Seasonal affective disorder ([place])    Syncope and collapse 10/23/2018    PAST SURGICAL HISTORY: Past Surgical History:  Procedure Laterality Date   ABDOMINAL HYSTERECTOMY     TAH-BSO   CATARACT EXTRACTION Bilateral    COLONOSCOPY WITH PROPOFOL N/A 02/06/2015   Procedure: COLONOSCOPY WITH PROPOFOL;  Surgeon: Garlan Fair, MD;  Location: [facility];  Service: Endoscopy;  Laterality: N/A;   LOOP RECORDER IMPLANT  05/2019   tooth extraction  2020    FAMILY HISTORY: The patient family history includes Cancer in her brother and brother; Heart disease in her brother; Hypertension in her father, mother, and sister; Stroke in  her sister.   SOCIAL HISTORY:  The patient  reports that she has never smoked. She has never used smokeless tobacco. She reports that she does not drink alcohol and does not use drugs.  Review of Systems  Constitutional: Negative for chills and fever.  HENT: Negative for hoarse voice and nosebleeds.   Eyes: Negative for discharge, double vision and pain.  Cardiovascular: Negative for chest pain, claudication, dyspnea on exertion, leg swelling, near-syncope, orthopnea, palpitations, paroxysmal nocturnal dyspnea and syncope.  Respiratory:  Negative for hemoptysis and shortness of breath.   Musculoskeletal: Negative for muscle cramps and myalgias.  Gastrointestinal: Negative for abdominal pain, constipation, diarrhea, hematemesis, hematochezia, melena, nausea and vomiting.  Neurological: Negative for dizziness and light-headedness.    PHYSICAL EXAM: Vitals with BMI 11/18/2019 06/08/2019 06/08/2019  Height 5\' 4"  - 5\' 4"   Weight 171 lbs - 174 lbs 3 oz  BMI 09.73 - 53.29  Systolic 924 268 341  Diastolic 88 80 91  Pulse 74 99 58    CONSTITUTIONAL: Well-developed and well-nourished. No acute distress.  SKIN: Skin is warm and dry. No rash noted. No cyanosis. No pallor. No jaundice HEAD: Normocephalic and atraumatic.  EYES: No scleral icterus MOUTH/THROAT: Moist oral membranes.  NECK: No JVD present. No thyromegaly noted. No carotid bruits  LYMPHATIC: No visible cervical adenopathy.  CHEST Normal respiratory effort. No intercostal retractions  LUNGS: Clear to auscultation bilaterally.  No stridor. No wheezes. No rales.  CARDIOVASCULAR: Regular rate and rhythm, positive S1-S2, no murmurs rubs or gallops appreciated.  The loop recorder site is healing well no signs of infection, site is clean dry and intact. ABDOMINAL: No apparent ascites.  EXTREMITIES: No peripheral edema  HEMATOLOGIC: No significant bruising NEUROLOGIC: Oriented to person, place, and time. Nonfocal. Normal muscle tone.  PSYCHIATRIC: Normal mood and affect. Normal behavior. Cooperative  CARDIAC DATABASE: EKG  11/18/2019: Normal sinus rhythm, 69 bpm, right bundle branch block, ST-T changes secondary to RBBB.  Lexiscan myoview stress test 02/11/2018: 1. Lexiscan stress test with low level exercise was performed. Patient reached 93% of the maximum predicted heart rate. No stress symptoms reported. Blood pressure was normal. Stress electrocardiogram showed sinus tachycardia, RBBB, no stress arrhythmias and normal stress repolarization. 2. The overall quality of the  study is good. There is no evidence of abnormal lung activity. Stress and rest SPECT images demonstrate homogeneous tracer distribution throughout the myocardium. Gated SPECT imaging reveals normal myocardial thickening and wall motion. The left ventricular ejection fraction was normal (67%). 3. Low risk study.  Echo at [facility], [place]-01/05/2018- normal left ventricle systolic function, DQ-22-29 percent. No regional wall motion abnormalities. Normal diastolic function. Trivial TR, no pulmonary hypertension.  Scheduled Remote loop recorder check 09/27/2019:  The presenting rhythm is sinus arrhythmia with IVCD and PAC. False A. Fib episodes. NO bradycardia episodes, no symptoms.   LABORATORY DATA: No flowsheet data found.  CMP Latest Ref Rng & Units 02/05/2019  Glucose 65 - 99 mg/dL 89  BUN 8 - 27 mg/dL 17  Creatinine 0.57 - 1.00 mg/dL 0.91  Sodium 134 - 144 mmol/L 139  Potassium 3.5 - 5.2 mmol/L 4.3  Chloride 96 - 106 mmol/L 99  CO2 20 - 29 mmol/L 22  Calcium 8.7 - 10.3 mg/dL 9.7   Lipid Panel  05/28/2018-cholesterol-208, HDL-79, LDL-117, triglycerides-64.  FINAL MEDICATION LIST END OF ENCOUNTER: No orders of the defined types were placed in this encounter.   There are no discontinued medications.   Current Outpatient Medications:  acetaminophen (TYLENOL) 500 MG tablet, Take 500 mg by mouth every 6 (six) hours as needed., Disp: , Rfl:    carboxymethylcellulose (REFRESH PLUS) 0.5 % SOLN, Place 1 drop into both eyes 2 (two) times daily as needed (dry eyes)., Disp: , Rfl:    diltiazem (CARTIA XT) 180 MG 24 hr capsule, Take 1 capsule (180 mg total) by mouth daily., Disp: 30 capsule, Rfl: 6   guaiFENesin (MUCINEX) 600 MG 12 hr tablet, Take 600 mg by mouth 2 (two) times daily as needed for to loosen phlegm., Disp: , Rfl:    Ketotifen Fumarate (ZADITOR OP), Apply to eye as needed., Disp: , Rfl:    loratadine (CLARITIN) 10 MG tablet, Take 10 mg by mouth  daily as needed for allergies., Disp: , Rfl:    memantine (NAMENDA) 10 MG tablet, Take 10 mg by mouth 2 (two) times daily., Disp: , Rfl:    Multiple Vitamin (MULTIVITAMIN WITH MINERALS) TABS tablet, Take 1 tablet by mouth daily., Disp: , Rfl:    olmesartan (BENICAR) 40 MG tablet, Take 1 tablet (40 mg total) by mouth every evening., Disp: 90 tablet, Rfl: 3   Omega-3 Fatty Acids (SALMON OIL-1000 PO), Take by mouth daily., Disp: , Rfl:    Turmeric (QC TUMERIC COMPLEX PO), Take 1 tablet by mouth daily., Disp: , Rfl:    hydrALAZINE (APRESOLINE) 100 MG tablet, Take 1 tablet (100 mg total) by mouth 2 (two) times daily., Disp: 90 tablet, Rfl: 3  IMPRESSION:    ICD-10-CM   1. Essential hypertension  I10 EKG 12-Lead  2. History of syncope  Z87.898   3. Status post placement of implantable loop recorder  Z95.818     RECOMMENDATIONS: April Douglas is a 86 y.o. female whose past medical history and cardiac risk factors include: Hypertension, hyperlipidemia, history of syncope, postmenopausal female, advanced age.  Benign essential hypertension:  Patient home blood pressures are well controlled.    Patient's blood pressure log will be scanned into the EMR.    Low salt diet recommended. A diet that is rich in fruits, vegetables, legumes, and low-fat dairy products and low in snacks, sweets, and meats (such as the Dietary Approaches to Stop Hypertension [DASH] diet).   History of syncope:  Since last office visit no new syncopal events.  Status post loop implantation: Reviewed the most recent loop interrogation report with the patient and her husband at today's visit.  Orders Placed This Encounter  Procedures   EKG 12-Lead   --Continue cardiac medications as reconciled in final medication list. --Return in about 6 months (around 05/17/2020) for Follow up, BP. Or sooner if needed. --Continue follow-up with your primary care physician regarding the management of your other chronic comorbid  conditions.  Patient's questions and concerns were addressed to her satisfaction. She voices understanding of the instructions provided during this encounter.   This note was created using a voice recognition software as a result there may be grammatical errors inadvertently enclosed that do not reflect the nature of this encounter. Every attempt is made to correct such errors.  Rex Kras, [place], [facility]  Pager: [phone] Office: [phone]

## 2019-11-23 DIAGNOSIS — H6123 Impacted cerumen, bilateral: Secondary | ICD-10-CM | POA: Diagnosis not present

## 2019-12-09 ENCOUNTER — Ambulatory Visit: Payer: Medicare Other | Admitting: Cardiology

## 2019-12-25 ENCOUNTER — Telehealth: Payer: Self-pay | Admitting: Cardiology

## 2019-12-28 NOTE — Telephone Encounter (Signed)
I called patient, spoke with her and her husband. The monitor is and has always been on. So I called Ruby Cola at [facility], he advised them to reboot monitor by turning off and then back on. He said that if that doesn't work, he will issue them another monitor.

## 2020-01-10 DIAGNOSIS — Z8639 Personal history of other endocrine, nutritional and metabolic disease: Secondary | ICD-10-CM | POA: Diagnosis not present

## 2020-01-10 DIAGNOSIS — R300 Dysuria: Secondary | ICD-10-CM | POA: Diagnosis not present

## 2020-01-10 DIAGNOSIS — R5383 Other fatigue: Secondary | ICD-10-CM | POA: Diagnosis not present

## 2020-01-10 DIAGNOSIS — Z131 Encounter for screening for diabetes mellitus: Secondary | ICD-10-CM | POA: Diagnosis not present

## 2020-01-10 DIAGNOSIS — E782 Mixed hyperlipidemia: Secondary | ICD-10-CM | POA: Diagnosis not present

## 2020-01-10 DIAGNOSIS — R200 Anesthesia of skin: Secondary | ICD-10-CM | POA: Diagnosis not present

## 2020-01-10 DIAGNOSIS — I10 Essential (primary) hypertension: Secondary | ICD-10-CM | POA: Diagnosis not present

## 2020-01-11 ENCOUNTER — Ambulatory Visit: Payer: Medicare Other | Attending: Internal Medicine

## 2020-01-11 DIAGNOSIS — Z23 Encounter for immunization: Secondary | ICD-10-CM

## 2020-01-11 NOTE — Progress Notes (Signed)
Covid-19 Vaccination Clinic  Name:  YITTY DACRES    MRN: [id number] DOB: 04-22-1933  01/11/2020  Ms. Schlesser was observed post Covid-19 immunization for 15 minutes without incident. She was provided with Vaccine Information Sheet and instruction to access the V-Safe system.   Ms. Sequin was instructed to call 911 with any severe reactions post vaccine: Marland Kitchen Difficulty breathing  . Swelling of face and throat  . A fast heartbeat  . A bad rash all over body  . Dizziness and weakness

## 2020-01-19 DIAGNOSIS — E871 Hypo-osmolality and hyponatremia: Secondary | ICD-10-CM | POA: Diagnosis not present

## 2020-01-19 DIAGNOSIS — R7303 Prediabetes: Secondary | ICD-10-CM | POA: Diagnosis not present

## 2020-01-25 DIAGNOSIS — Z23 Encounter for immunization: Secondary | ICD-10-CM | POA: Diagnosis not present

## 2020-02-09 NOTE — Progress Notes (Signed)
External Labs: Collected: 01/10/2020 Hemoglobin 14.2 g/dL Creatinine 0.85 mg/dL. eGFR: 77 mL/min per 1.73 m Sodium 133, potassium 4.6, chloride 96, bicarb 30 Lipid profile: Total cholesterol 200, triglycerides 64, HDL 74, LDL 114, non-HDL 126 Hemoglobin A1c: 5.7 TSH: 0.83,

## 2020-02-14 DIAGNOSIS — E878 Other disorders of electrolyte and fluid balance, not elsewhere classified: Secondary | ICD-10-CM | POA: Diagnosis not present

## 2020-04-03 ENCOUNTER — Encounter: Payer: Medicare Other | Attending: Family Medicine | Admitting: Dietician

## 2020-04-03 DIAGNOSIS — R7303 Prediabetes: Secondary | ICD-10-CM | POA: Insufficient documentation

## 2020-04-03 NOTE — Progress Notes (Signed)
On 04/03/2020 patient completed Core Session 1 of Diabetes Prevention Program course virtually with [facility]. The following learning objectives were met by the patient during this class:   Virtual Visit via Video Note  I connected with April Douglas, DOB:06-27-1933 by a video enabled application and verified that I am speaking with the correct person using two identifiers.  Location: Patient: Virtual Provider: Office    Learning Objectives:   Be able to explain the purpose and benefits of the National Diabetes Prevention Program.   Be able to describe the events that will take place at every session.   Know the weight loss and physical activity goals established by the [facility] Diabetes Prevention Program.   Know their own individual weight loss and physical activity goals.   Be able to explain the important effect of self-monitoring on behavior change.   Goals:  . Record food and beverage intake in "Food and Activity Tracker" over the next week.  . E-mail completed "Food and Activity Tracker" to Lifestyle Coach next week before session 2. . Circle the foods or beverages you think are highest in fat and calories in your food tracker. . Read the labels on the food you buy, and consider using measuring cups and spoons to help you calculate the amount you eat. We will talk about measuring in more detail in the coming weeks.   Follow-Up Plan:  Attend Core Session 2 next week.   E-mail completed "Food and Activity Tracker" to Lifestyle Coach next week before class.

## 2020-04-07 DIAGNOSIS — R55 Syncope and collapse: Secondary | ICD-10-CM | POA: Diagnosis not present

## 2020-04-07 DIAGNOSIS — Z95818 Presence of other cardiac implants and grafts: Secondary | ICD-10-CM | POA: Diagnosis not present

## 2020-04-07 DIAGNOSIS — Z4509 Encounter for adjustment and management of other cardiac device: Secondary | ICD-10-CM | POA: Diagnosis not present

## 2020-04-10 ENCOUNTER — Encounter (HOSPITAL_BASED_OUTPATIENT_CLINIC_OR_DEPARTMENT_OTHER): Payer: Medicare Other | Admitting: Dietician

## 2020-04-10 DIAGNOSIS — R7303 Prediabetes: Secondary | ICD-10-CM | POA: Diagnosis not present

## 2020-04-10 NOTE — Progress Notes (Signed)
On 04/10/2020 patient completed Core Session 2 of Diabetes Prevention Program course virtually with [facility]. The following learning objectives were met by the patient during this class:   I connected with Whitman Hero, 11-19-1933 by a video enabled application and verified that I am speaking with the correct person using two identifiers.  Location: Patient: Virtual   Provider: Office  Learning Objectives:  Self-monitor their weight during the weeks following Session 2.   Describe the relationship between fat and calories.   Explain the reason for, and basic principles of, self-monitoring fat grams and calories.   Identify their personal fat gram goals.   Use the ?Fat and Calorie Counter to calculate the calories and fat grams of a given selection of foods.   Keep a running total of the fat grams they eat each day.   Calculate fat, calories, and serving sizes from nutrition labels.   Goals:   Weigh yourself at the same time each day, or every few days, and record your weight in your Food and Activity Tracker.  Write down everything you eat and drink in your Food and Activity Tracker.  Measure portions as much as you can, and start reading labels.   Use the ?Fat and Calorie Counter to figure out the amount of fat and calories in what you ate, and write the amount down in your Food and Activity Tracker.  Keep a running fat gram total throughout the day. Come as close to your fat gram goal as you can.   Follow-Up Plan:  Attend Core Session 3 next week.   Email completed  "Food and Activity Tracker" to Lifestyle Coach next week.

## 2020-04-17 ENCOUNTER — Encounter (HOSPITAL_BASED_OUTPATIENT_CLINIC_OR_DEPARTMENT_OTHER): Payer: Medicare Other | Admitting: Dietician

## 2020-04-17 DIAGNOSIS — R7303 Prediabetes: Secondary | ICD-10-CM | POA: Diagnosis not present

## 2020-04-17 NOTE — Progress Notes (Signed)
On 04/17/2020 patient completed Core Session 3 of Diabetes Prevention Program course virtually with [facility]. The following learning objectives were met by the patient during this class:   I connected with Whitman Hero, December 02, 1933 by a video enabled application and verified that I am speaking with the correct person using two identifiers.  Location: Patient: Virtual Provider: Office  Learning Objectives:  Weigh and measure foods.  Estimate the fat and calorie content of common foods.  Describe three ways to eat less fat and fewer calories.  Create a plan to eat less fat for the following week.   Goals:   Track weight when weighing outside of class.   Track food and beverages eaten each day in Food and Activity Tracker and include fat grams and calories for each.   Try to stay within fat gram goal.   Complete plan for eating less high fat foods and answer related homework questions.    Follow-Up Plan:  Attend Core Session 4 next week.   Bring completed "Food and Activity Tracker" next week to be reviewed by Lifestyle Coach.

## 2020-04-24 ENCOUNTER — Encounter: Payer: Medicare Other | Attending: Family Medicine | Admitting: Dietician

## 2020-04-24 DIAGNOSIS — R7303 Prediabetes: Secondary | ICD-10-CM | POA: Diagnosis not present

## 2020-04-24 NOTE — Progress Notes (Signed)
On 04/24/2020 patient completed Core Session 4 of Diabetes Prevention Program course virtually with [facility]. The following learning objectives were met by the patient during this class:   I connected with Keimya S. Wanzer, 10/09/1933 by a video enabled application and verified that I am speaking with the correct person using two identifiers.  Location: Patient: Virtual Provider: Office  Learning Objectives:  Describe the MyPlate food guide and its recommendations, including how to reduce fat and calories in our diet.  Compare and contrast MyPlate guidelines with participants' eating habits.  List ways to replace high-fat and high-calorie foods with low-fat and low-calorie foods.  Explain the importance of eating plenty of whole grains, vegetables, and fruits, while staying within fat gram goals.  Explain the importance of eating foods from all groups of MyPlate and of eating a variety of foods from within each group.  Explain why a balanced diet is beneficial to health.  Goals:   Record weight taken outside of class.   Track foods and beverages eaten each day in the "Food and Activity Tracker," including calories and fat grams for each item.   Practice comparing what you eat with the recommendations of MyPlate using the "Rate Your Plate" handout.   Complete the "Rate Your Plate" handout form on at least 3 days.   Answer homework questions.   Follow-Up Plan:  Attend Core Session 5 next week.   Email completed "Food and Activity Tracker" next week to be reviewed by Lifestyle Coach.   

## 2020-04-26 DIAGNOSIS — R059 Cough, unspecified: Secondary | ICD-10-CM | POA: Diagnosis not present

## 2020-05-01 ENCOUNTER — Encounter (HOSPITAL_BASED_OUTPATIENT_CLINIC_OR_DEPARTMENT_OTHER): Payer: Medicare Other | Admitting: Dietician

## 2020-05-01 DIAGNOSIS — R7303 Prediabetes: Secondary | ICD-10-CM

## 2020-05-01 NOTE — Progress Notes (Signed)
On 05/01/2020 patient completed Core Session 5 of Diabetes Prevention Program course virtually with [facility]. The following learning objectives were met by the patient during this class:   I connected with April Douglas, 11-Apr-1933 by a video enabled application and verified that I am speaking with the correct person using two identifiers.  Location: Patient: Virtual  Provider: Office  Learning Objectives:  Establish a physical activity goal.  Explain the importance of the physical activity goal.  Describe their current level of physical activity.  Name ways that they are already physically active.  Develop personal plans for physical activity for the next week.   Goals:   Record weight taken outside of class.   Track foods and beverages eaten each day in the "Food and Activity Tracker," including calories and fat grams for each item.   Make an Activity Plan including date, specific type of activity, and length of time you plan to be active that includes at last 60 minutes of activity for the week.   Track activity type, minutes you were active, and distance you reached each day in the "Food and Activity Tracker."   Follow-Up Plan: . Attend Core Session 6 next week.  . E-mail completed "Food and Activity Tracker" to Lifestyle Coach next week before class

## 2020-05-08 ENCOUNTER — Encounter (HOSPITAL_BASED_OUTPATIENT_CLINIC_OR_DEPARTMENT_OTHER): Payer: Medicare Other | Admitting: Dietician

## 2020-05-08 DIAGNOSIS — R7303 Prediabetes: Secondary | ICD-10-CM

## 2020-05-08 NOTE — Progress Notes (Signed)
On 05/08/2020 patient completed Core Session 6 of Diabetes Prevention Program course virtually with [facility]. The following learning objectives were met by the patient during this class:   I connected with Whitman Hero 04-30-1933 by a video enabled application and verified that I am speaking with the correct person using two identifiers.  Location: Patient: Virtual Provider: Office  Learning Objectives:  Graph their daily physical activity.   Describe two ways of finding the time to be active.   Define "lifestyle activity."   Describe how to prevent injury.   Develop an activity plan for the coming week.   Goals:   Record weight taken outside of class.   Track foods and beverages eaten each day in the "Food and Activity Tracker," including calories and fat grams for each item.    Track activity type, minutes you were active, and distance you reached each day in the "Food and Activity Tracker."   Set aside one 20 to 30-minute block of time every day or find two or more periods of 10 to15 minutes each for physical activity.   Warm up, cool down, and stretch.  Make a Physical Activities Plan for the Week.   Follow-Up Plan:  Attend Core Session 7 next week.   E-mail completed "Food and Activity Tracker" to Lifestyle Coach next week before class

## 2020-05-12 DIAGNOSIS — Z95818 Presence of other cardiac implants and grafts: Secondary | ICD-10-CM | POA: Diagnosis not present

## 2020-05-12 DIAGNOSIS — R55 Syncope and collapse: Secondary | ICD-10-CM | POA: Diagnosis not present

## 2020-05-12 DIAGNOSIS — Z4509 Encounter for adjustment and management of other cardiac device: Secondary | ICD-10-CM | POA: Diagnosis not present

## 2020-05-15 ENCOUNTER — Encounter (HOSPITAL_BASED_OUTPATIENT_CLINIC_OR_DEPARTMENT_OTHER): Payer: Medicare Other | Admitting: Dietician

## 2020-05-15 DIAGNOSIS — R7303 Prediabetes: Secondary | ICD-10-CM

## 2020-05-15 DIAGNOSIS — N390 Urinary tract infection, site not specified: Secondary | ICD-10-CM | POA: Diagnosis not present

## 2020-05-15 NOTE — Progress Notes (Signed)
On 05/15/2020 patient completed Core Session 7 of Diabetes Prevention Program course virtually with [facility]. The following learning objectives were met by the patient during this class:   I connected with Whitman Hero 02-14-1934 by a video enabled application and verified that I am speaking with the correct person using two identifiers.  Location: Patient: Virtual Provider: Office  Learning Objectives:  Define calorie balance.  Explain how healthy eating and being active are related in terms of calorie balance.   Describe the relationship between calorie balance and weight loss.   Describe his or her progress as it relates to calorie balance.   Develop an activity plan for the coming week.   Goals:   Record weight taken outside of class.   Track foods and beverages eaten each day in the "Food and Activity Tracker," including calories and fat grams for each item.    Track activity type, minutes you were active, and distance you reached each day in the "Food and Activity Tracker."   Set aside one 20 to 30-minute block of time every day or find two or more periods of 10 to15 minutes each for physical activity.   Make a Physical Activities Plan for the Week.   Make active lifestyle choices all through the day   Stay at or go slightly over activity goal.   Follow-Up Plan:  Attend Core Session 8 next week.   E-mail completed "Food and Activity Tracker" to Lifestyle Coach next week before class

## 2020-05-18 ENCOUNTER — Other Ambulatory Visit: Payer: Self-pay

## 2020-05-18 ENCOUNTER — Ambulatory Visit: Payer: Medicare Other | Admitting: Cardiology

## 2020-05-18 ENCOUNTER — Encounter: Payer: Self-pay | Admitting: Cardiology

## 2020-05-18 VITALS — BP 164/83 | HR 71 | Temp 97.9°F | Resp 16 | Ht 64.0 in | Wt 158.4 lb

## 2020-05-18 DIAGNOSIS — I10 Essential (primary) hypertension: Secondary | ICD-10-CM | POA: Diagnosis not present

## 2020-05-18 DIAGNOSIS — Z87898 Personal history of other specified conditions: Secondary | ICD-10-CM

## 2020-05-18 DIAGNOSIS — Z95818 Presence of other cardiac implants and grafts: Secondary | ICD-10-CM

## 2020-05-18 MED ORDER — OLMESARTAN MEDOXOMIL 40 MG PO TABS: 40.0000 mg | ORAL_TABLET | Freq: Every evening | ORAL | 1 refills | Status: DC

## 2020-05-18 MED ORDER — DILTIAZEM HCL ER COATED BEADS 180 MG PO CP24: 180.0000 mg | ORAL_CAPSULE | Freq: Every day | ORAL | 1 refills | Status: DC

## 2020-05-18 MED ORDER — HYDRALAZINE HCL 100 MG PO TABS: 100.0000 mg | ORAL_TABLET | Freq: Three times a day (TID) | ORAL | 5 refills | Status: DC

## 2020-05-18 NOTE — Progress Notes (Signed)
Chief Complaint  Patient presents with  . Hypertension  . Follow-up   Date: 05/18/20 Last Office Visit: 11/18/2019  HPI  April Douglas is a 87 y.o. female who presents to the office with a chief complaint of " blood pressure management/follow-up." Patient's past medical history and cardiac risk factors include: Hypertension, hyperlipidemia, history of syncope, postmenopausal female, advanced age.  Patient is accompanied by her husband at today's office visit.    Blood pressure log reviewed her systolic blood pressures range between 120-162 mmHg.  She denies any headaches or focal neurological deficits.  She is requesting refill on her antihypertensive medications.  In the past patient is also had episodes of syncope due to various etiologies.  Therefore she underwent loop recorder implantation in 2021.  The most recent device interrogation report reviewed with the patient during today's office visit and noted below for further reference.  Otherwise patient denies any chest pain or shortness of breath.  She remains euvolemic.  No hospitalizations or urgent care visits for cardiovascular symptoms.  ALLERGIES: No Known Allergies   MEDICATION LIST PRIOR TO VISIT: Current Outpatient Medications on File Prior to Visit  Medication Sig Dispense Refill  . acetaminophen (TYLENOL) 500 MG tablet Take 500 mg by mouth every 6 (six) hours as needed.    . carboxymethylcellulose (REFRESH PLUS) 0.5 % SOLN Place 1 drop into both eyes 2 (two) times daily as needed (dry eyes).    Marland Kitchen guaiFENesin (MUCINEX) 600 MG 12 hr tablet Take 600 mg by mouth 2 (two) times daily as needed for to loosen phlegm.    Marland Kitchen Ketotifen Fumarate (ZADITOR OP) Apply to eye as needed.    . loratadine (CLARITIN) 10 MG tablet Take 10 mg by mouth daily as needed for allergies.    . memantine (NAMENDA) 10 MG tablet Take 10 mg by mouth 2 (two) times daily.    . Multiple Vitamin (MULTIVITAMIN WITH MINERALS) TABS tablet Take 1 tablet by  mouth daily.    . Omega-3 Fatty Acids (SALMON OIL-1000 PO) Take by mouth daily.    . Turmeric (QC TUMERIC COMPLEX PO) Take 1 tablet by mouth daily.     No current facility-administered medications on file prior to visit.    PAST MEDICAL HISTORY: Past Medical History:  Diagnosis Date  . Dehydration   . Dizziness   . Elevated serum creatinine   . Encounter for loop recorder check 06/27/2019  . Glaucoma   . Hyperlipidemia   . Hypertension   . Loop recorder Biotron[place] 05/21/19 06/08/2019  . Right bundle branch block   . Seasonal affective disorder ([place])   . Syncope and collapse 10/23/2018    PAST SURGICAL HISTORY: Past Surgical History:  Procedure Laterality Date  . ABDOMINAL HYSTERECTOMY     TAH-BSO  . CATARACT EXTRACTION Bilateral   . COLONOSCOPY WITH PROPOFOL N/A 02/06/2015   Procedure: COLONOSCOPY WITH PROPOFOL;  Surgeon: Garlan Fair, MD;  Location: [facility];  Service: Endoscopy;  Laterality: N/A;  . LOOP RECORDER IMPLANT  05/2019  . tooth extraction  2020    FAMILY HISTORY: The patient family history includes Cancer in her brother and brother; Heart disease in her brother; Hypertension in her father, mother, and sister; Stroke in her sister.   SOCIAL HISTORY:  The patient  reports that she has never smoked. She has never used smokeless tobacco. She reports that she does not drink alcohol and does not use drugs.  Review of Systems  Constitutional: Negative for chills and fever.  HENT: Negative for hoarse voice and nosebleeds.   Eyes: Negative for discharge, double vision and pain.  Cardiovascular: Negative for chest pain, claudication, dyspnea on exertion, leg swelling, near-syncope, orthopnea, palpitations, paroxysmal nocturnal dyspnea and syncope.  Respiratory: Negative for hemoptysis and shortness of breath.   Musculoskeletal: Negative for muscle cramps and myalgias.  Gastrointestinal: Negative for abdominal pain, constipation, diarrhea, hematemesis, hematochezia,  melena, nausea and vomiting.  Neurological: Negative for dizziness and light-headedness.    PHYSICAL EXAM: Vitals with BMI 05/18/2020 11/18/2019 06/08/2019  Height 5' 4"  5' 4"  -  Weight 158 lbs 6 oz 171 lbs -  BMI 50.03 70.48 -  Systolic 889 169 450  Diastolic 83 88 80  Pulse 71 74 99    CONSTITUTIONAL: Well-developed and well-nourished. No acute distress.  SKIN: Skin is warm and dry. No rash noted. No cyanosis. No pallor. No jaundice HEAD: Normocephalic and atraumatic.  EYES: No scleral icterus MOUTH/THROAT: Moist oral membranes.  NECK: No JVD present. No thyromegaly noted. No carotid bruits  LYMPHATIC: No visible cervical adenopathy.  CHEST Normal respiratory effort. No intercostal retractions  LUNGS: Clear to auscultation bilaterally.  No stridor. No wheezes. No rales.  CARDIOVASCULAR: Regular rate and rhythm, positive S1-S2, no murmurs rubs or gallops appreciated.  The loop recorder site is healing well no signs of infection, site is clean dry and intact. ABDOMINAL: No apparent ascites.  EXTREMITIES: No peripheral edema  HEMATOLOGIC: No significant bruising NEUROLOGIC: Oriented to person, place, and time. Nonfocal. Normal muscle tone.  PSYCHIATRIC: Normal mood and affect. Normal behavior. Cooperative  CARDIAC DATABASE: EKG  05/18/2020: Normal sinus rhythm, 73 bpm, right bundle branch block, nonspecific.  Normalities most likely secondary to RBBB.   Lexiscan myoview stress test 02/11/2018: 1. Lexiscan stress test with low level exercise was performed. Patient reached 93% of the maximum predicted heart rate. No stress symptoms reported. Blood pressure was normal. Stress electrocardiogram showed sinus tachycardia, RBBB, no stress arrhythmias and normal stress repolarization. 2. The overall quality of the study is good. There is no evidence of abnormal lung activity. Stress and rest SPECT images demonstrate homogeneous tracer distribution throughout the myocardium. Gated SPECT imaging  reveals normal myocardial thickening and wall motion. The left ventricular ejection fraction was normal (67%). 3. Low risk study.  Echo at [facility], [place]-01/05/2018- normal left ventricle systolic function, TU-88-28 percent. No regional wall motion abnormalities. Normal diastolic function. Trivial TR, no pulmonary hypertension.  Scheduled Remote loop recorder check 05/12/2020:  False AF = PAC and artifact. 10 Sinus Bradycardia episodes since last transmission 04/07/2020 (HR 40-45 at night). No symptoms.   LABORATORY DATA: No flowsheet data found.  CMP Latest Ref Rng & Units 02/05/2019  Glucose 65 - 99 mg/dL 89  BUN 8 - 27 mg/dL 17  Creatinine 0.57 - 1.00 mg/dL 0.91  Sodium 134 - 144 mmol/L 139  Potassium 3.5 - 5.2 mmol/L 4.3  Chloride 96 - 106 mmol/L 99  CO2 20 - 29 mmol/L 22  Calcium 8.7 - 10.3 mg/dL 9.7   Lipid Panel  05/28/2018-cholesterol-208, HDL-79, LDL-117, triglycerides-64.  External Labs: Collected: 01/10/2020 Creatinine 0.85 mg/dL. eGFR: 77 mL/min per 1.73 m Hemoglobin 14.2 g/dL, hematocrit 42.1% Lipid profile: Total cholesterol 200, triglycerides 64, HDL 74, LDL 114, non-HDL 126 Hemoglobin A1c: 5.70 TSH: 0.83  FINAL MEDICATION LIST END OF ENCOUNTER: Meds ordered this encounter  Medications  . olmesartan (BENICAR) 40 MG tablet    Sig: Take 1 tablet (40 mg total) by mouth every evening.  Dispense:  90 tablet    Refill:  1  . hydrALAZINE (APRESOLINE) 100 MG tablet    Sig: Take 1 tablet (100 mg total) by mouth 3 (three) times daily.    Dispense:  90 tablet    Refill:  5  . diltiazem (CARTIA XT) 180 MG 24 hr capsule    Sig: Take 1 capsule (180 mg total) by mouth daily.    Dispense:  90 capsule    Refill:  1    Medications Discontinued During This Encounter  Medication Reason  . olmesartan (BENICAR) 40 MG tablet Reorder  . diltiazem (CARTIA XT) 180 MG 24 hr capsule Reorder  . hydrALAZINE (APRESOLINE) 100 MG tablet Reorder      Current Outpatient Medications:  .  acetaminophen (TYLENOL) 500 MG tablet, Take 500 mg by mouth every 6 (six) hours as needed., Disp: , Rfl:  .  carboxymethylcellulose (REFRESH PLUS) 0.5 % SOLN, Place 1 drop into both eyes 2 (two) times daily as needed (dry eyes)., Disp: , Rfl:  .  guaiFENesin (MUCINEX) 600 MG 12 hr tablet, Take 600 mg by mouth 2 (two) times daily as needed for to loosen phlegm., Disp: , Rfl:  .  Ketotifen Fumarate (ZADITOR OP), Apply to eye as needed., Disp: , Rfl:  .  loratadine (CLARITIN) 10 MG tablet, Take 10 mg by mouth daily as needed for allergies., Disp: , Rfl:  .  memantine (NAMENDA) 10 MG tablet, Take 10 mg by mouth 2 (two) times daily., Disp: , Rfl:  .  Multiple Vitamin (MULTIVITAMIN WITH MINERALS) TABS tablet, Take 1 tablet by mouth daily., Disp: , Rfl:  .  Omega-3 Fatty Acids (SALMON OIL-1000 PO), Take by mouth daily., Disp: , Rfl:  .  Turmeric (QC TUMERIC COMPLEX PO), Take 1 tablet by mouth daily., Disp: , Rfl:  .  diltiazem (CARTIA XT) 180 MG 24 hr capsule, Take 1 capsule (180 mg total) by mouth daily., Disp: 90 capsule, Rfl: 1 .  hydrALAZINE (APRESOLINE) 100 MG tablet, Take 1 tablet (100 mg total) by mouth 3 (three) times daily., Disp: 90 tablet, Rfl: 5 .  olmesartan (BENICAR) 40 MG tablet, Take 1 tablet (40 mg total) by mouth every evening., Disp: 90 tablet, Rfl: 1  IMPRESSION:    ICD-10-CM   1. Essential hypertension  I10 EKG 12-Lead    olmesartan (BENICAR) 40 MG tablet    hydrALAZINE (APRESOLINE) 100 MG tablet    diltiazem (CARTIA XT) 180 MG 24 hr capsule  2. History of syncope  Z87.898   3. Status post placement of implantable loop recorder  Z95.818     RECOMMENDATIONS: KIANNI LHEUREUX is a 87 y.o. female whose past medical history and cardiac risk factors include: Hypertension, hyperlipidemia, history of syncope, postmenopausal female, advanced age.  Benign essential hypertension:  Home blood pressure log reviewed.  Refilled losartan, diltiazem,  and hydralazine.  Patient was supposed to be on hydralazine 3 times daily however he is currently taking it twice a day.  Given the elevated blood pressures I have asked her to take hydralazine 100 mg p.o. 3 times daily.  . Low salt diet recommended. A diet that is rich in fruits, vegetables, legumes, and low-fat dairy products and low in snacks, sweets, and meats (such as the Dietary Approaches to Stop Hypertension [DASH] diet).   History of syncope:  Since last office visit no new syncopal events.  Status post loop implantation:  Reviewed the most recent loop interrogation report with the patient and her husband  at today's visit.  Orders Placed This Encounter  Procedures  . EKG 12-Lead   --Continue cardiac medications as reconciled in final medication list. --Return in about 6 months (around 11/18/2020) for Follow up, BP. Or sooner if needed. --Continue follow-up with your primary care physician regarding the management of your other chronic comorbid conditions.  Patient's questions and concerns were addressed to her satisfaction. She voices understanding of the instructions provided during this encounter.   This note was created using a voice recognition software as a result there may be grammatical errors inadvertently enclosed that do not reflect the nature of this encounter. Every attempt is made to correct such errors.  Rex Kras, [place], [place] [facility]  Pager: [phone] Office: [phone]

## 2020-05-21 ENCOUNTER — Other Ambulatory Visit: Payer: Self-pay | Admitting: Cardiology

## 2020-05-21 DIAGNOSIS — I10 Essential (primary) hypertension: Secondary | ICD-10-CM

## 2020-05-22 ENCOUNTER — Encounter: Payer: Medicare Other | Attending: Family Medicine | Admitting: Dietician

## 2020-05-22 DIAGNOSIS — R7303 Prediabetes: Secondary | ICD-10-CM | POA: Insufficient documentation

## 2020-05-22 NOTE — Progress Notes (Signed)
On 05/22/2020 patient completed Core Session 8 of Diabetes Prevention Program course virtually with [facility]. The following learning objectives were met by the patient during this class:   I connected with April Douglas, 25-Sep-1933 by a video enabled application and verified that I am speaking with the correct person using two identifiers.  Location: Patient: Virtual  Provider: Office  Learning Objectives:  Recognize positive and negative food and activity cues.   Change negative food and activity cues to positive cues.   Add positive cues for activity and eliminate cues for inactivity.   Develop a plan for removing one problem food cue for the coming week.   Goals:   Record weight taken outside of class.   Track foods and beverages eaten each day in the "Food and Activity Tracker," including calories and fat grams for each item.    Track activity type, minutes you were active, and distance you reached each day in the "Food and Activity Tracker."   Set aside one 20 to 30-minute block of time every day or find two or more periods of 10 to15 minutes each for physical activity.   Remove one problem food cue.   Add one positive cue for being more active.  Follow-Up Plan: . Attend Core Session 9 next week.  . Email completed "Food and Activity Tracker" next week to be reviewed by Lifestyle Coach.

## 2020-05-29 ENCOUNTER — Encounter (HOSPITAL_BASED_OUTPATIENT_CLINIC_OR_DEPARTMENT_OTHER): Payer: Medicare Other | Admitting: Dietician

## 2020-05-29 DIAGNOSIS — R7303 Prediabetes: Secondary | ICD-10-CM

## 2020-05-29 NOTE — Progress Notes (Signed)
On 05/29/2020 patient completed Core Session 9 of Diabetes Prevention Program course virtually with [facility]. The following learning objectives were met by the patient during this class:   Virtual Visit via Video Note  I connected with April Douglas, 02-28-1934 by a video enabled application and verified that I am speaking with the correct person using two identifiers.  Location: Patient: Virtual Provider: Office  Learning Objectives:  List and describe five steps to problem solving.   Apply the five problem solving steps to resolve a problem he or she has with eating less fat and fewer calories or being more active.   Goals:   Record weight taken outside of class.   Track foods and beverages eaten each day in the "Food and Activity Tracker," including calories and fat grams for each item.    Track activity type, minutes you were active, and distance you reached each day in the "Food and Activity Tracker."   Set aside one 20 to 30-minute block of time every day or find two or more periods of 10 to15 minutes each for physical activity.   Use problem solving action plan created during session to problem solve.   Follow-Up Plan:  Attend Core Session 10 next week.   Email completed "Food and Activity Tracker" next week to be reviewed by Lifestyle Coach.  Email menus from favorite restaurants to next session for future discussion.

## 2020-06-06 DIAGNOSIS — R7303 Prediabetes: Secondary | ICD-10-CM | POA: Diagnosis not present

## 2020-06-06 DIAGNOSIS — Z0000 Encounter for general adult medical examination without abnormal findings: Secondary | ICD-10-CM | POA: Diagnosis not present

## 2020-06-06 DIAGNOSIS — E7800 Pure hypercholesterolemia, unspecified: Secondary | ICD-10-CM | POA: Diagnosis not present

## 2020-06-06 DIAGNOSIS — I10 Essential (primary) hypertension: Secondary | ICD-10-CM | POA: Diagnosis not present

## 2020-06-06 DIAGNOSIS — R413 Other amnesia: Secondary | ICD-10-CM | POA: Diagnosis not present

## 2020-06-06 DIAGNOSIS — M6780 Other specified disorders of synovium and tendon, unspecified site: Secondary | ICD-10-CM | POA: Diagnosis not present

## 2020-06-12 ENCOUNTER — Other Ambulatory Visit: Payer: Self-pay

## 2020-06-12 ENCOUNTER — Encounter (HOSPITAL_BASED_OUTPATIENT_CLINIC_OR_DEPARTMENT_OTHER): Payer: Medicare Other | Admitting: Dietician

## 2020-06-12 DIAGNOSIS — R7303 Prediabetes: Secondary | ICD-10-CM | POA: Diagnosis not present

## 2020-06-12 NOTE — Progress Notes (Signed)
On 06/12/2020 patient completed Core Session 10 of Diabetes Prevention Program course virtually with [facility]. The following learning objectives were met by the patient during this class:   Virtual Visit via Video Note  I connected with April Douglas, 1934/02/17 by a video enabled application and verified that I am speaking with the correct person using two identifiers.  Location: Patient: Virtual Provider: Office  Learning Objectives:  List and describe the four keys for healthy eating out.   Give examples of how to apply these keys at the type of restaurants that the participants go to regularly.   Make an appropriate meal selection from a restaurant menu.   Demonstrate how to ask for a substitute item using assertive language and a polite tone of voice.    Goals:   Record weight taken outside of class.   Track foods and beverages eaten each day in the "Food and Activity Tracker," including calories and fat grams for each item.    Track activity type, minutes you were active, and distance you reached each day in the "Food and Activity Tracker."   Set aside one 20 to 30-minute block of time every day or find two or more periods of 10 to15 minutes each for physical activity.   Utilize positive action plan and complete questions on "To Do List."   Follow-Up Plan:  Attend Core Session 11.   Email completed "Food and Activity Tracker" to be reviewed by Lifestyle Coach.

## 2020-06-13 DIAGNOSIS — R55 Syncope and collapse: Secondary | ICD-10-CM | POA: Diagnosis not present

## 2020-06-13 DIAGNOSIS — Z4509 Encounter for adjustment and management of other cardiac device: Secondary | ICD-10-CM | POA: Diagnosis not present

## 2020-06-13 DIAGNOSIS — Z95818 Presence of other cardiac implants and grafts: Secondary | ICD-10-CM | POA: Diagnosis not present

## 2020-06-19 ENCOUNTER — Encounter: Payer: Medicare Other | Attending: Family Medicine | Admitting: Dietician

## 2020-06-19 ENCOUNTER — Other Ambulatory Visit: Payer: Self-pay

## 2020-06-19 DIAGNOSIS — R7303 Prediabetes: Secondary | ICD-10-CM | POA: Insufficient documentation

## 2020-06-19 NOTE — Progress Notes (Signed)
On 06/19/2020 patient completed Session 11 of Diabetes Prevention Program course virtually with [facility]. By the end of this session patients are able to complete the following objectives:   Virtual Visit via Video Note  I connected with April Douglas, Jan 21, 1934 by a video enabled application and verified that I am speaking with the correct person using two identifiers.  Location: Patient: Virtual Provider: Office  Learning Objectives:  Give examples of negative thoughts that could prevent them from meeting their goals of losing weight and being more physically active.   Describe how to stop negative thoughts and talk back to them with positive thoughts.   Practice 1) stopping negative thoughts and 2) talking back to negative thoughts with positive ones.    Goals:   Record weight taken outside of class.   Track foods and beverages eaten each day in the "Food and Activity Tracker," including calories and fat grams for each item.    Track activity type, minutes you were active, and distance you reached each day in the "Food and Activity Tracker."   If you have any negative thoughts-write them in your Food and Activity Trackers, along with how you talked back to them. Practice stopping negative thoughts and talking back to them with positive thoughts.   Follow-Up Plan:  Attend Core Session 12 next week.   Email completed "Food and Activity Tracker" before next week to be reviewed by Lifestyle Coach.

## 2020-06-20 DIAGNOSIS — L811 Chloasma: Secondary | ICD-10-CM | POA: Diagnosis not present

## 2020-06-20 DIAGNOSIS — M67449 Ganglion, unspecified hand: Secondary | ICD-10-CM | POA: Diagnosis not present

## 2020-06-20 DIAGNOSIS — L910 Hypertrophic scar: Secondary | ICD-10-CM | POA: Diagnosis not present

## 2020-06-26 ENCOUNTER — Other Ambulatory Visit: Payer: Self-pay

## 2020-06-26 ENCOUNTER — Encounter (HOSPITAL_BASED_OUTPATIENT_CLINIC_OR_DEPARTMENT_OTHER): Payer: Medicare Other | Admitting: Dietician

## 2020-06-26 DIAGNOSIS — R7303 Prediabetes: Secondary | ICD-10-CM | POA: Diagnosis not present

## 2020-06-26 NOTE — Progress Notes (Signed)
Patient was seen on 06/26/2020 for the Core Session 12 of Diabetes Prevention Program course at [facility]. By the end of this session patients are able to complete the following objectives:   Virtual Visit via Video Note  I connected with April Douglas, [birth year] on 06/26/20 at 10:30 AM EDT by a video enabled application and verified that I am speaking with the correct person using two identifiers.  Location: Patient: Virtual Provider: Office  Learning Objectives:  Describe their current progress toward defined goals.  Describe common causes for slipping from healthy eating or being  active.  Explain what to do to get back on their feet after a slip.  Goals:   Record weight taken outside of class.   Track foods and beverages eaten each day in the "Food and Activity Tracker," including calories and fat grams for each item.    Track activity type, minutes active, and distance reached each day in the "Food and Activity Tracker."   Try out the two action plans created during session- "Slips from Healthy Eating: Action Plan" and "Slips from Being Active: Action Plan"  Answer questions on the handout.   Follow-Up Plan:  Attend Core Session 13 next week.   Bring completed "Food and Activity Tracker" next week to be reviewed by Lifestyle Coach.

## 2020-07-03 ENCOUNTER — Encounter (HOSPITAL_BASED_OUTPATIENT_CLINIC_OR_DEPARTMENT_OTHER): Payer: Medicare Other | Admitting: Dietician

## 2020-07-03 ENCOUNTER — Other Ambulatory Visit: Payer: Self-pay

## 2020-07-03 DIAGNOSIS — R7303 Prediabetes: Secondary | ICD-10-CM

## 2020-07-03 NOTE — Progress Notes (Signed)
On 07/03/2020 patient completed the Core Session 13 of Diabetes Prevention Program course virtually with// [facility]. By the end of this session patients are able to complete the following objectives:   Virtual Visit via Video Note  I connected with@ on 07/03/20 at 10:30 AM EDT by a video enabled application and verified that I am speaking with the correct person using two identifiers.  Location: Patient: Virtual Provider: Office  Learning Objectives:  Describe ways to add interest and variety to their activity plans.  Define ?aerobic fitness.  Explain the four F.I.T.T. principles (frequency, intensity, time, and type of activity) and how they relate to aerobic fitness.   Goals:   Record weight taken outside of class.   Track foods and beverages eaten each day in the "Food and Activity Tracker," including calories and fat grams for each item.    Track activity type, minutes you were active, and distance you reached each day in the "Food and Activity Tracker."   Do your best to reach activity goal for the week.  Use one of the F.I.T.T. principles to jump start workouts.  Document activity level on the "To Do Next Week" handout.  Follow-Up Plan:  Attend Core Session 14 next week.   Email completed "Food and Activity Tracker" before next week to be reviewed by Lifestyle Coach.

## 2020-07-09 DIAGNOSIS — R55 Syncope and collapse: Secondary | ICD-10-CM | POA: Diagnosis not present

## 2020-07-09 DIAGNOSIS — Z95818 Presence of other cardiac implants and grafts: Secondary | ICD-10-CM | POA: Diagnosis not present

## 2020-07-09 DIAGNOSIS — Z4509 Encounter for adjustment and management of other cardiac device: Secondary | ICD-10-CM | POA: Diagnosis not present

## 2020-07-10 ENCOUNTER — Encounter (HOSPITAL_BASED_OUTPATIENT_CLINIC_OR_DEPARTMENT_OTHER): Payer: Medicare Other | Admitting: Registered"

## 2020-07-10 ENCOUNTER — Encounter: Payer: Self-pay | Admitting: Registered"

## 2020-07-10 DIAGNOSIS — R7303 Prediabetes: Secondary | ICD-10-CM | POA: Diagnosis not present

## 2020-07-10 DIAGNOSIS — Z713 Dietary counseling and surveillance: Secondary | ICD-10-CM

## 2020-07-10 NOTE — Progress Notes (Signed)
On 07/10/20 patient completed Core Session 14 of Diabetes Prevention Program course virtually with [facility]. By the end of this session patients are able to complete the following objectives:   Virtual Visit via Video Note  I connected with April Douglas on 07/10/20 at 10:30 AM EDT by a video enabled application and verified that I am speaking with the correct person using two identifiers.  Location: Patient: Home.  Provider: Office.   Learning Objectives:  Give examples of problem social cues and helpful social cues.   Explain how to remove problem social cues and add helpful ones.   Describe ways of coping with vacations and social events such as parties, holidays, and visits from relatives and friends.   Create an action plan to change a problem social cue and add a helpful one.   Goals:   Record weight taken outside of class.   Track foods and beverages eaten each day in the "Food and Activity Tracker," including calories and fat grams for each item.    Track activity type, minutes you were active, and distance you reached each day in the "Food and Activity Tracker."   Do your best to reach activity goal for the week.  Use action plan created during session to change a problem social cue and add a helpful social cue.   Answer questions regarding success of changing social cues on "To Do Next Week" handout.   Follow-Up Plan:  Attend Core Session 15 next week.   Email completed "Food and Activity Tracker" before next week to be reviewed by Lifestyle Coach.

## 2020-07-14 DIAGNOSIS — Z4509 Encounter for adjustment and management of other cardiac device: Secondary | ICD-10-CM | POA: Diagnosis not present

## 2020-07-14 DIAGNOSIS — R55 Syncope and collapse: Secondary | ICD-10-CM | POA: Diagnosis not present

## 2020-07-14 DIAGNOSIS — Z95818 Presence of other cardiac implants and grafts: Secondary | ICD-10-CM | POA: Diagnosis not present

## 2020-07-17 ENCOUNTER — Encounter: Payer: Self-pay | Admitting: Registered"

## 2020-07-17 ENCOUNTER — Encounter: Payer: Medicare Other | Attending: Family Medicine | Admitting: Registered"

## 2020-07-17 DIAGNOSIS — R7303 Prediabetes: Secondary | ICD-10-CM

## 2020-07-17 DIAGNOSIS — Z713 Dietary counseling and surveillance: Secondary | ICD-10-CM

## 2020-07-17 NOTE — Progress Notes (Signed)
On 07/17/20 patient completed Core Session 15 of Diabetes Prevention Program course virtually with [facility]. By the end of this session patients are able to complete the following objectives:   Virtual Visit via Video Note  I connected with April Douglas on 07/17/20 at 10:30 AM EDT by a video enabled application and verified that I am speaking with the correct person using two identifiers.  Location: Patient: Home.  Provider: Office.   Learning Objectives:  Explain how to prevent stress or cope with unavoidable stress.   Describe how this program can be a source of stress.   Explain how to manage stressful situations.   Create and follow an action plan for either preventing or coping with a stressful situation.   Goals:   Record weight taken outside of class.   Track foods and beverages eaten each day in the "Food and Activity Tracker," including calories and fat grams for each item.    Track activity type, minutes you were active, and distance you reached each day in the "Food and Activity Tracker."   Do your best to reach activity goal for the week.  Follow your action plan to reduce stress.   Answer questions on handout regarding success of action plan.   Follow-Up Plan:  Attend Core Session 16 next week.   Email completed "Food and Activity Tracker" before next week to be reviewed by Lifestyle Coach.

## 2020-07-24 ENCOUNTER — Encounter (HOSPITAL_BASED_OUTPATIENT_CLINIC_OR_DEPARTMENT_OTHER): Payer: Medicare Other | Admitting: Dietician

## 2020-07-24 ENCOUNTER — Other Ambulatory Visit: Payer: Self-pay

## 2020-07-24 DIAGNOSIS — R7303 Prediabetes: Secondary | ICD-10-CM

## 2020-07-24 NOTE — Progress Notes (Signed)
On 07/24/2020 patient completed Core Session 16 of Diabetes Prevention Program course virtually with [facility]. By the end of this session patients are able to complete the following objectives:   Virtual Visit via Video Note  I connected with Whitman Hero, [birth year] on 07/24/20 at 10:30 AM EDT by a video enabled application and verified that I am speaking with the correct person using two identifiers.  Location: Patient: Virtual Provider: Office  Learning Objectives:  Measure their progress toward weight and physical activity goals since Session 1.   Develop a plan for improving progress, if their goals have not yet been attained.   Describe ways to stay motivated long-term.   Goals:   Record weight taken outside of class.   Track foods and beverages eaten each day in the "Food and Activity Tracker," including calories and fat grams for each item.    Track activity type, minutes you were active, and distance you reached each day in the "Food and Activity Tracker."   Utilize action plan to help stay motivated and complete questions on "To Do List."   Follow-Up Plan:  Attend session 17 in two weeks.   Email completed "Food and Activity Tracker" before next session to be reviewed by Lifestyle Coach.

## 2020-08-07 ENCOUNTER — Encounter (HOSPITAL_BASED_OUTPATIENT_CLINIC_OR_DEPARTMENT_OTHER): Payer: Medicare Other | Admitting: Dietician

## 2020-08-07 ENCOUNTER — Other Ambulatory Visit: Payer: Self-pay

## 2020-08-07 DIAGNOSIS — R7303 Prediabetes: Secondary | ICD-10-CM

## 2020-08-07 NOTE — Progress Notes (Signed)
On 08/07/2020 patient completed a post core session of the Diabetes Prevention Program course virtually with [facility]. By the end of this session patients are able to complete the following objectives:   Virtual Visit via Video Note  I connected with Whitman Hero, 14-May-1933 by a video enabled application and verified that I am speaking with the correct person using two identifiers.  Location: Patient: Virtual  Provider: Office  Learning Objectives:  Identify how to maintain and/or continue working toward program goals for the remainder of the program.   Describe ways that food and activity tracking can assist them in maintaining/reaching program goals.   Identify progress they have made since the beginning of the program.   Describe the differences between unsaturated, saturated, and trans fat on heart health.   List dietary sources of unsaturated, saturated, and trans fats.  Explain ways to reduce intake of saturated fat and replace them with heart healthy fats.  Goals:   Record weight taken outside of class.   Track foods and beverages eaten each day in the "Food and Activity Tracker," including calories and fat grams for each item.    Track activity type, minutes you were active, and distance you reached each day in the "Food and Activity Tracker."   Follow-Up Plan: . Attend next session.  . Email completed "Food and Activity Trackers" before next session to be reviewed by Lifestyle Coach.

## 2020-08-08 ENCOUNTER — Telehealth: Payer: Self-pay | Admitting: Cardiology

## 2020-08-10 DIAGNOSIS — N3001 Acute cystitis with hematuria: Secondary | ICD-10-CM | POA: Diagnosis not present

## 2020-08-14 DIAGNOSIS — Z95818 Presence of other cardiac implants and grafts: Secondary | ICD-10-CM | POA: Diagnosis not present

## 2020-08-14 DIAGNOSIS — Z4509 Encounter for adjustment and management of other cardiac device: Secondary | ICD-10-CM | POA: Diagnosis not present

## 2020-08-14 DIAGNOSIS — R55 Syncope and collapse: Secondary | ICD-10-CM | POA: Diagnosis not present

## 2020-08-16 ENCOUNTER — Other Ambulatory Visit: Payer: Self-pay

## 2020-08-16 ENCOUNTER — Ambulatory Visit: Payer: Medicare Other | Admitting: Cardiology

## 2020-08-16 DIAGNOSIS — Z4509 Encounter for adjustment and management of other cardiac device: Secondary | ICD-10-CM

## 2020-08-16 DIAGNOSIS — R55 Syncope and collapse: Secondary | ICD-10-CM

## 2020-08-16 DIAGNOSIS — Z95818 Presence of other cardiac implants and grafts: Secondary | ICD-10-CM

## 2020-08-16 NOTE — Progress Notes (Signed)
ICD-10-CM   1. Encounter for loop recorder check  Z45.09   2. Status post placement of implantable loop recorder  Z95.818   3. Syncope and collapse  R55    Scheduled in person loop recorder check for adjusting frequent falls alerts for atrial fibrillation. Battery life 80%. Atrial fibrillation sensitivity determination reduced from medium to low and medium size increased from 8 x 16 to 16 x 24.  Detection intervals also increased from 5-11.  Termination intervals increased from 1 to the current 5.   Adrian Prows, MD, [facility] 08/16/2020, 8:57 AM Office: [phone] Fax: [phone] Pager: [phone]

## 2020-08-17 NOTE — Telephone Encounter (Signed)
Patient set up for loop recorder check in the office due to frequent false alarms.

## 2020-08-21 ENCOUNTER — Encounter: Payer: Medicare Other | Attending: Family Medicine | Admitting: Dietician

## 2020-08-21 ENCOUNTER — Other Ambulatory Visit: Payer: Self-pay

## 2020-08-21 DIAGNOSIS — R7303 Prediabetes: Secondary | ICD-10-CM | POA: Diagnosis not present

## 2020-08-21 NOTE — Progress Notes (Signed)
On 08/21/2020 patient completed a post core session of the Diabetes Prevention Program course virtually with [facility]. By the end of this session patients are able to complete the following objectives:   Virtual Visit via Video Note  I connected with April Douglas, 08/14/1933 on 08/21/20 at 10:30 AM EDT by a video enabled application and verified that I am speaking with the correct person using two identifiers.  Location: Patient: Virtual Provider: Office  Learning Objectives:  List risk factors for heart disease.   Define the difference between HDL and LDL cholesterol  List ways to reduce risk for heart disease.   Goals:   Record weight taken outside of class.   Track foods and beverages eaten each day in the "Food and Activity Tracker," including calories and fat grams for each item.    Track activity type, minutes you were active, and distance you reached each day in the "Food and Activity Tracker."   Follow-Up Plan:  Attend next session.   Email completed "Food and Activity Trackers" before next session to be reviewed by Lifestyle Coach.

## 2020-09-04 ENCOUNTER — Other Ambulatory Visit: Payer: Self-pay

## 2020-09-04 ENCOUNTER — Encounter (HOSPITAL_BASED_OUTPATIENT_CLINIC_OR_DEPARTMENT_OTHER): Payer: Medicare Other | Admitting: Dietician

## 2020-09-04 DIAGNOSIS — R7303 Prediabetes: Secondary | ICD-10-CM

## 2020-09-04 NOTE — Progress Notes (Signed)
On 09/04/2020 patient completed a post core session of the Diabetes Prevention Program course virtually with [facility]. By the end of this session patients are able to complete the following objectives:   I connected with Whitman Hero, Jul 01, 1933 by a video enabled application and verified that I am speaking with the correct person using two identifiers.  Location: Patient: Virtual Provider: Office  Learning Objectives: Describe how to incorporate more fruits and vegetables into meals. List criteria for selecting good quality fruits and vegetables at the store.  Define mindful eating. List the benefits of eating mindfully.   Goals:  Record weight taken outside of class.  Track foods and beverages eaten each day in the "Food and Activity Tracker," including calories and fat grams for each item.   Track activity type, minutes you were active, and distance you reached each day in the "Food and Activity Tracker."   Follow-Up Plan: Attend next session.  Email completed "Food and Activity Trackers" before next session to be reviewed by Lifestyle Coach.

## 2020-09-06 DIAGNOSIS — N3001 Acute cystitis with hematuria: Secondary | ICD-10-CM | POA: Diagnosis not present

## 2020-09-06 DIAGNOSIS — N390 Urinary tract infection, site not specified: Secondary | ICD-10-CM | POA: Diagnosis not present

## 2020-09-08 DIAGNOSIS — M1990 Unspecified osteoarthritis, unspecified site: Secondary | ICD-10-CM | POA: Diagnosis not present

## 2020-09-12 ENCOUNTER — Other Ambulatory Visit: Payer: Self-pay | Admitting: Sports Medicine

## 2020-09-12 ENCOUNTER — Ambulatory Visit
Admission: RE | Admit: 2020-09-12 | Discharge: 2020-09-12 | Disposition: A | Discharge status: Home / Self Care | Payer: Medicare Other | Source: Ambulatory Visit | Attending: Sports Medicine | Admitting: Sports Medicine

## 2020-09-12 DIAGNOSIS — M25562 Pain in left knee: Secondary | ICD-10-CM

## 2020-09-14 DIAGNOSIS — Z4509 Encounter for adjustment and management of other cardiac device: Secondary | ICD-10-CM | POA: Diagnosis not present

## 2020-09-14 DIAGNOSIS — R55 Syncope and collapse: Secondary | ICD-10-CM | POA: Diagnosis not present

## 2020-09-14 DIAGNOSIS — Z95818 Presence of other cardiac implants and grafts: Secondary | ICD-10-CM | POA: Diagnosis not present

## 2020-09-18 DIAGNOSIS — N390 Urinary tract infection, site not specified: Secondary | ICD-10-CM | POA: Diagnosis not present

## 2020-09-19 ENCOUNTER — Other Ambulatory Visit: Payer: Self-pay | Admitting: Family Medicine

## 2020-09-19 DIAGNOSIS — Z1231 Encounter for screening mammogram for malignant neoplasm of breast: Secondary | ICD-10-CM

## 2020-09-22 DIAGNOSIS — H1045 Other chronic allergic conjunctivitis: Secondary | ICD-10-CM | POA: Diagnosis not present

## 2020-09-22 DIAGNOSIS — H04123 Dry eye syndrome of bilateral lacrimal glands: Secondary | ICD-10-CM | POA: Diagnosis not present

## 2020-09-22 DIAGNOSIS — H40023 Open angle with borderline findings, high risk, bilateral: Secondary | ICD-10-CM | POA: Diagnosis not present

## 2020-09-22 DIAGNOSIS — H35363 Drusen (degenerative) of macula, bilateral: Secondary | ICD-10-CM | POA: Diagnosis not present

## 2020-09-25 ENCOUNTER — Encounter: Payer: Medicare Other | Attending: Family Medicine | Admitting: Dietician

## 2020-09-25 ENCOUNTER — Encounter: Payer: Self-pay | Admitting: Dietician

## 2020-09-25 ENCOUNTER — Other Ambulatory Visit: Payer: Self-pay

## 2020-09-25 DIAGNOSIS — R7303 Prediabetes: Secondary | ICD-10-CM | POA: Diagnosis not present

## 2020-09-25 NOTE — Progress Notes (Signed)
On 09/25/2020 patient completed the Diabetes Prevention Program course virtually with [facility]. By the end of this session patients are able to complete the following objectives:   Virtual Visit via Video Note  I connected with April Douglas, [birth year] by a video enabled application and verified that I am speaking with the correct person using two identifiers.  Location: Patient: Virtual Provider: Office  Learning Objectives: Define fiber and describe the difference between insoluble and soluble fiber  List foods that are good sources of fiber Explain the health benefits of fiber  Describe ways to increase volume of meals and snacks while staying within fat goal.   Goals:  Record weight taken outside of class.  Track foods and beverages eaten each day in the "Food and Activity Tracker," including calories and fat grams for each item.   Track activity type, minutes you were active, and distance you reached each day in the "Food and Activity Tracker."   Follow-Up Plan: Attend next session.  Email completed "Food and Activity Trackers" before next session to be reviewed by Lifestyle Coach.

## 2020-10-09 ENCOUNTER — Encounter (HOSPITAL_BASED_OUTPATIENT_CLINIC_OR_DEPARTMENT_OTHER): Payer: Medicare Other | Admitting: Dietician

## 2020-10-09 ENCOUNTER — Other Ambulatory Visit: Payer: Self-pay

## 2020-10-09 DIAGNOSIS — R7303 Prediabetes: Secondary | ICD-10-CM | POA: Diagnosis not present

## 2020-10-09 NOTE — Progress Notes (Signed)
On 10/09/2020 patient completed a post core session of the Diabetes Prevention Program course virtually with [facility]. By the end of this session patients are able to complete the following objectives:   Virtual Visit via Video Note  I connected with Whitman Hero, April 25, 1933 by a video enabled application and verified that I am speaking with the correct person using two identifiers.  Location: Patient: Virtual Provider: Office  Learning Objectives: List ways to make recipes healthier.  List lower-fat and lower-calorie substitutions for common ingredients.  Identify low-fat cooking methods.  Describe how to choose a cookbook that works best for their needs.   Goals:  Record weight taken outside of class.  Track foods and beverages eaten each day in the "Food and Activity Tracker," including calories and fat grams for each item.   Track activity type, minutes you were active, and distance you reached each day in the "Food and Activity Tracker."   Follow-Up Plan: Attend next session.  Email completed "Food and Activity Trackers" before next session to be reviewed by Lifestyle Coach.

## 2020-10-12 DIAGNOSIS — M1712 Unilateral primary osteoarthritis, left knee: Secondary | ICD-10-CM | POA: Diagnosis not present

## 2020-10-15 DIAGNOSIS — R55 Syncope and collapse: Secondary | ICD-10-CM | POA: Diagnosis not present

## 2020-10-15 DIAGNOSIS — Z4509 Encounter for adjustment and management of other cardiac device: Secondary | ICD-10-CM | POA: Diagnosis not present

## 2020-10-15 DIAGNOSIS — Z95818 Presence of other cardiac implants and grafts: Secondary | ICD-10-CM | POA: Diagnosis not present

## 2020-10-23 ENCOUNTER — Other Ambulatory Visit: Payer: Self-pay

## 2020-10-23 ENCOUNTER — Encounter: Payer: Medicare Other | Attending: Family Medicine | Admitting: Dietician

## 2020-10-23 DIAGNOSIS — R7303 Prediabetes: Secondary | ICD-10-CM | POA: Insufficient documentation

## 2020-10-23 NOTE — Progress Notes (Signed)
On 10/23/2020 patient attended a virtual grocery store tour session as part of the Diabetes Prevention Program with [facility].  Virtual Visit via Video Note  I connected with April Douglas, [birth year] by a video enabled application and verified that I am speaking with the correct person using two identifiers.  Location: Patient: Virtual Provider: Office   Learning Objectives: Develop a plan for our grocery shopping experience Putting together a list How to navigate the grocery store Identify 4 main sections of the grocery store Produce Meat/Poultry/Fish Dairy  Inside Aisles Consider tips for shopping in each of these four sections Reflect on our own shopping habits Create a new goal for our next grocery shopping experience Engage in a group discussion  Goals:  Record weight taken outside of class.  Track foods and beverages eaten each day in the "Food and Activity Tracker," including calories and fat grams for each item.  Create one new goal for the next grocery shopping experience based on the information provided today  Follow-Up Plan: Attend next session.  Email completed "Food and Activity Tracker" before next session to be reviewed by Lifestyle Coach.

## 2020-11-08 ENCOUNTER — Other Ambulatory Visit: Payer: Self-pay | Admitting: Cardiology

## 2020-11-08 DIAGNOSIS — I10 Essential (primary) hypertension: Secondary | ICD-10-CM

## 2020-11-09 ENCOUNTER — Ambulatory Visit
Admission: RE | Admit: 2020-11-09 | Discharge: 2020-11-09 | Disposition: A | Discharge status: Home / Self Care | Payer: Medicare Other | Source: Ambulatory Visit | Attending: Family Medicine | Admitting: Family Medicine

## 2020-11-09 ENCOUNTER — Other Ambulatory Visit: Payer: Self-pay | Admitting: Cardiology

## 2020-11-09 ENCOUNTER — Other Ambulatory Visit: Payer: Self-pay

## 2020-11-09 DIAGNOSIS — Z1231 Encounter for screening mammogram for malignant neoplasm of breast: Secondary | ICD-10-CM | POA: Diagnosis not present

## 2020-11-09 DIAGNOSIS — I10 Essential (primary) hypertension: Secondary | ICD-10-CM

## 2020-11-15 DIAGNOSIS — Z4509 Encounter for adjustment and management of other cardiac device: Secondary | ICD-10-CM | POA: Diagnosis not present

## 2020-11-15 DIAGNOSIS — Z95818 Presence of other cardiac implants and grafts: Secondary | ICD-10-CM | POA: Diagnosis not present

## 2020-11-15 DIAGNOSIS — R55 Syncope and collapse: Secondary | ICD-10-CM | POA: Diagnosis not present

## 2020-11-21 ENCOUNTER — Other Ambulatory Visit: Payer: Self-pay

## 2020-11-21 ENCOUNTER — Ambulatory Visit: Payer: Medicare Other | Admitting: Cardiology

## 2020-11-21 ENCOUNTER — Encounter: Payer: Self-pay | Admitting: Cardiology

## 2020-11-21 VITALS — BP 118/68 | HR 62 | Temp 98.8°F | Resp 16 | Ht 64.0 in | Wt 148.0 lb

## 2020-11-21 DIAGNOSIS — Z4509 Encounter for adjustment and management of other cardiac device: Secondary | ICD-10-CM

## 2020-11-21 DIAGNOSIS — I10 Essential (primary) hypertension: Secondary | ICD-10-CM

## 2020-11-21 DIAGNOSIS — Z87898 Personal history of other specified conditions: Secondary | ICD-10-CM

## 2020-11-21 MED ORDER — HYDRALAZINE HCL 100 MG PO TABS: ORAL_TABLET | ORAL | 5 refills | Status: DC

## 2020-11-21 MED ORDER — AMLODIPINE BESYLATE 5 MG PO TABS: 5.0000 mg | ORAL_TABLET | Freq: Every evening | ORAL | 0 refills | Status: DC

## 2020-11-21 NOTE — Progress Notes (Signed)
Date: 11/21/20 Last Office Visit: 05/18/2020  Chief Complaint  Patient presents with   Hypertension   Follow-up    6 month   HPI  April Douglas is a 87 y.o. female who presents to the office with a chief complaint of " 78-monthfollow-up for blood pressure management." Patient's past medical history and cardiac risk factors include: Hypertension, hyperlipidemia, history of syncope, postmenopausal female, advanced age.  Patient is accompanied by her husband at today's office visit.    Since last office visit patient is doing well from a cardiovascular standpoint.  She denies any chest pain or shortness of breath at rest or with effort related activities.  No hospitalizations or urgent care visits for cardiovascular symptoms.  Patient brings in her blood pressure log for review.  Patient's morning blood pressures tend to be elevated compared to the evening.  Morning blood pressures are around 140-160 mmHg and evening blood pressures are around 119-135 mmHg.  Medications reconciled.  No reoccurrence of syncope.  Most recent loop recorder implantation results reviewed with the patient and her husband at today's visit.  ALLERGIES: No Known Allergies   MEDICATION LIST PRIOR TO VISIT: Current Outpatient Medications on File Prior to Visit  Medication Sig Dispense Refill   acetaminophen (TYLENOL) 500 MG tablet Take 500 mg by mouth every 6 (six) hours as needed.     calcium carbonate (OSCAL) 1500 (600 Ca) MG TABS tablet Take by mouth 2 (two) times daily with a meal.     carboxymethylcellulose (REFRESH PLUS) 0.5 % SOLN Place 1 drop into both eyes 2 (two) times daily as needed (dry eyes).     cetirizine (ZYRTEC) 10 MG tablet Take 10 mg by mouth daily.     Cholecalciferol (VITAMIN D3) 50 MCG (2000 UT) CHEW Chew by mouth.     diltiazem (CARTIA XT) 180 MG 24 hr capsule Take 1 capsule (180 mg total) by mouth daily. 90 capsule 1   hydrALAZINE (APRESOLINE) 100 MG tablet TAKE 1 TABLET(100 MG) BY MOUTH  THREE TIMES DAILY 90 tablet 5   memantine (NAMENDA) 10 MG tablet Take 10 mg by mouth 2 (two) times daily.     Multiple Vitamin (MULTIVITAMIN WITH MINERALS) TABS tablet Take 1 tablet by mouth daily.     nitrofurantoin, macrocrystal-monohydrate, (MACROBID) 100 MG capsule Take 1 capsule by mouth in the morning and at bedtime.     olmesartan (BENICAR) 40 MG tablet TAKE 1 TABLET(40 MG) BY MOUTH EVERY EVENING 90 tablet 1   Omega-3 Fatty Acids (SALMON OIL-1000 PO) Take by mouth daily.     No current facility-administered medications on file prior to visit.    PAST MEDICAL HISTORY: Past Medical History:  Diagnosis Date   Dehydration    Dizziness    Elevated serum creatinine    Encounter for loop recorder check 06/27/2019   Glaucoma    Hyperlipidemia    Hypertension    Loop recorder [place] 05/21/19 06/08/2019   Right bundle branch block    Seasonal affective disorder ([place]    Syncope and collapse 10/23/2018    PAST SURGICAL HISTORY: Past Surgical History:  Procedure Laterality Date   ABDOMINAL HYSTERECTOMY     TAH-BSO   CATARACT EXTRACTION Bilateral    COLONOSCOPY WITH PROPOFOL N/A 02/06/2015   Procedure: COLONOSCOPY WITH PROPOFOL;  Surgeon: MGarlan Fair MD;  Location: [facility];  Service: Endoscopy;  Laterality: N/A;   LOOP RECORDER IMPLANT  05/2019   tooth extraction  2020    FAMILY HISTORY:  The patient family history includes Cancer in her brother and brother; Heart disease in her brother; Hypertension in her father, mother, and sister; Stroke in her sister.   SOCIAL HISTORY:  The patient  reports that she has never smoked. She has never used smokeless tobacco. She reports that she does not drink alcohol and does not use drugs.  Review of Systems  Constitutional: Negative for chills and fever.  HENT:  Negative for hoarse voice and nosebleeds.   Eyes:  Negative for discharge, double vision and pain.  Cardiovascular:  Negative for chest pain, claudication, dyspnea on  exertion, leg swelling, near-syncope, orthopnea, palpitations, paroxysmal nocturnal dyspnea and syncope.  Respiratory:  Negative for hemoptysis and shortness of breath.   Musculoskeletal:  Negative for muscle cramps and myalgias.  Gastrointestinal:  Negative for abdominal pain, constipation, diarrhea, hematemesis, hematochezia, melena, nausea and vomiting.  Neurological:  Negative for dizziness and light-headedness.   PHYSICAL EXAM: Vitals with BMI 11/21/2020 05/18/2020 11/18/2019  Height _0  _1  _2   Weight 148 lbs 158 lbs 6 oz 171 lbs  BMI 25.39 99.37 16.96  Systolic 789 381 017  Diastolic 68 83 88  Pulse 62 71 74    CONSTITUTIONAL: Well-developed and well-nourished. No acute distress.  SKIN: Skin is warm and dry. No rash noted. No cyanosis. No pallor. No jaundice HEAD: Normocephalic and atraumatic.  EYES: No scleral icterus MOUTH/THROAT: Moist oral membranes.  NECK: No JVD present. No thyromegaly noted. No carotid bruits  LYMPHATIC: No visible cervical adenopathy.  CHEST Normal respiratory effort. No intercostal retractions  LUNGS: Clear to auscultation bilaterally.  No stridor. No wheezes. No rales.  CARDIOVASCULAR: Regular rate and rhythm, positive S1-S2, no murmurs rubs or gallops appreciated.  The loop recorder site is healing well no signs of infection, site is clean dry and intact. ABDOMINAL: No apparent ascites.  EXTREMITIES: No peripheral edema  HEMATOLOGIC: No significant bruising NEUROLOGIC: Oriented to person, place, and time. Nonfocal. Normal muscle tone.  PSYCHIATRIC: Normal mood and affect. Normal behavior. Cooperative  CARDIAC DATABASE: EKG  11/21/2020: Ectopic atrial rhythm, 66 bpm, right bundle branch block, without underlying injury pattern.   Lexiscan myoview stress test 02/11/2018: 1. Lexiscan stress test with low level exercise was performed. Patient reached 93% of the maximum predicted heart rate. No stress symptoms reported. Blood pressure was normal.  Stress electrocardiogram showed sinus tachycardia, RBBB, no stress arrhythmias and normal stress repolarization. 2. The overall quality of the study is good. There is no evidence of abnormal lung activity. Stress and rest SPECT images demonstrate homogeneous tracer distribution throughout the myocardium. Gated SPECT imaging reveals normal myocardial thickening and wall motion. The left ventricular ejection fraction was normal (67%). 3. Low risk study.   Echo at [facility], [place]-01/05/2018- normal left ventricle systolic function, [id number] percent. No regional wall motion abnormalities. Normal diastolic function. Trivial TR, no pulmonary hypertension.  Remote loop recorder transmission 10/14/2020: Predominant rhythm is normal sinus rhythm.  No significant arrhythmias, no atrial fibrillation, no pauses.  No symptoms reported.  LABORATORY DATA: No flowsheet data found.  CMP Latest Ref Rng & Units 02/05/2019  Glucose 65 - 99 mg/dL 89  BUN 8 - 27 mg/dL 17  Creatinine 0.57 - 1.00 mg/dL 0.91  Sodium 134 - 144 mmol/L 139  Potassium 3.5 - 5.2 mmol/L 4.3  Chloride 96 - 106 mmol/L 99  CO2 20 - 29 mmol/L 22  Calcium 8.7 - 10.3 mg/dL 9.7   Lipid Panel  05/28/2018-cholesterol-208, HDL-79, LDL-117, triglycerides-64.  External Labs: Collected: 01/10/2020 Creatinine 0.85 mg/dL. eGFR: 77 mL/min per 1.73 m Hemoglobin 14.2 g/dL, hematocrit 42.1% Lipid profile: Total cholesterol 200, triglycerides 64, HDL 74, LDL 114, non-HDL 126 Hemoglobin A1c: 5.70 TSH: 0.83  FINAL MEDICATION LIST END OF ENCOUNTER: Meds ordered this encounter  Medications   amLODipine (NORVASC) 5 MG tablet    Sig: Take 1 tablet (5 mg total) by mouth every evening.    Dispense:  90 tablet    Refill:  0     Medications Discontinued During This Encounter  Medication Reason   Ketotifen Fumarate (ZADITOR OP) Error   loratadine (CLARITIN) 10 MG tablet Error   guaiFENesin (MUCINEX) 600 MG 12 hr tablet  Error   Turmeric (QC TUMERIC COMPLEX PO) Error     Current Outpatient Medications:    acetaminophen (TYLENOL) 500 MG tablet, Take 500 mg by mouth every 6 (six) hours as needed., Disp: , Rfl:    amLODipine (NORVASC) 5 MG tablet, Take 1 tablet (5 mg total) by mouth every evening., Disp: 90 tablet, Rfl: 0   calcium carbonate (OSCAL) 1500 (600 Ca) MG TABS tablet, Take by mouth 2 (two) times daily with a meal., Disp: , Rfl:    carboxymethylcellulose (REFRESH PLUS) 0.5 % SOLN, Place 1 drop into both eyes 2 (two) times daily as needed (dry eyes)., Disp: , Rfl:    cetirizine (ZYRTEC) 10 MG tablet, Take 10 mg by mouth daily., Disp: , Rfl:    Cholecalciferol (VITAMIN D3) 50 MCG (2000 UT) CHEW, Chew by mouth., Disp: , Rfl:    diltiazem (CARTIA XT) 180 MG 24 hr capsule, Take 1 capsule (180 mg total) by mouth daily., Disp: 90 capsule, Rfl: 1   hydrALAZINE (APRESOLINE) 100 MG tablet, TAKE 1 TABLET(100 MG) BY MOUTH THREE TIMES DAILY, Disp: 90 tablet, Rfl: 5   memantine (NAMENDA) 10 MG tablet, Take 10 mg by mouth 2 (two) times daily., Disp: , Rfl:    Multiple Vitamin (MULTIVITAMIN WITH MINERALS) TABS tablet, Take 1 tablet by mouth daily., Disp: , Rfl:    nitrofurantoin, macrocrystal-monohydrate, (MACROBID) 100 MG capsule, Take 1 capsule by mouth in the morning and at bedtime., Disp: , Rfl:    olmesartan (BENICAR) 40 MG tablet, TAKE 1 TABLET(40 MG) BY MOUTH EVERY EVENING, Disp: 90 tablet, Rfl: 1   Omega-3 Fatty Acids (SALMON OIL-1000 PO), Take by mouth daily., Disp: , Rfl:   IMPRESSION:    ICD-10-CM   1. Essential hypertension  I10 EKG 12-Lead    amLODipine (NORVASC) 5 MG tablet    2. Encounter for loop recorder check  Z45.09     3. History of syncope  Z87.898       RECOMMENDATIONS: April Douglas is a 87 y.o. female whose past medical history and cardiac risk factors include: Hypertension, hyperlipidemia, history of syncope, postmenopausal female, advanced age.  Essential hypertension Office blood  pressures are very well controlled. Home blood pressure log notes elevated readings in the morning as compared to the evening. Add amlodipine 5 mg p.o. every afternoon. Refill hydralazine 100 mg p.o. 3 times daily Low-salt diet recommended.   Continue blood pressure log given her age and comorbid conditions would recommend a goal systolic blood pressures are around 140 mmHg or lower if able to tolerate without having episodes of near syncope/syncope.  Encounter for loop recorder check Most recent loop recorder transmission reviewed with both the patient and her husband at today's office visit and noted above for further reference.  History of syncope No reoccurrence  of syncope since last office encounter.   Orders Placed This Encounter  Procedures   EKG 12-Lead   --Continue cardiac medications as reconciled in final medication list. --Return in about 6 months (around 05/21/2021) for Follow up, BP. Or sooner if needed. --Continue follow-up with your primary care physician regarding the management of your other chronic comorbid conditions.  Patient's questions and concerns were addressed to her satisfaction. She voices understanding of the instructions provided during this encounter.   This note was created using a voice recognition software as a result there may be grammatical errors inadvertently enclosed that do not reflect the nature of this encounter. Every attempt is made to correct such errors.  Rex Kras, [place], [facility]  Pager: [phone] Office: [phone]

## 2020-11-27 ENCOUNTER — Encounter: Payer: Medicare Other | Attending: Family Medicine | Admitting: Dietician

## 2020-11-27 ENCOUNTER — Other Ambulatory Visit: Payer: Self-pay

## 2020-11-27 DIAGNOSIS — R7303 Prediabetes: Secondary | ICD-10-CM | POA: Insufficient documentation

## 2020-11-27 NOTE — Progress Notes (Signed)
On 11/27/2020 patient completed a post core session of the Diabetes Prevention Program virtually with [facility]. By the end of this session patients are able to complete the following objectives:   Virtual Visit via Video Note  I connected with Whitman Hero, 08-02-1933 by a video enabled application and verified that I am speaking with the correct person using two identifiers.  Location: Patient: Virtual Provider: Office  Learning Objectives: List indoor physical activity options.  Identify any barriers to being active and brainstorm how to overcome barriers.  Describe short and long-term health benefits of physical activity.   Goals:  Record weight taken outside of class.  Track foods and beverages eaten each day in the "Food and Activity Tracker," including calories and fat grams for each item.   Track activity type, minutes you were active, and distance you reached each day in the "Food and Activity Tracker."   Follow-Up Plan: Attend next session.  Email completed "Food and Activity Trackers" before next session to be reviewed by Lifestyle Coach.

## 2020-12-02 ENCOUNTER — Other Ambulatory Visit: Payer: Self-pay | Admitting: Cardiology

## 2020-12-02 DIAGNOSIS — I10 Essential (primary) hypertension: Secondary | ICD-10-CM

## 2020-12-06 DIAGNOSIS — E7800 Pure hypercholesterolemia, unspecified: Secondary | ICD-10-CM | POA: Diagnosis not present

## 2020-12-06 DIAGNOSIS — R413 Other amnesia: Secondary | ICD-10-CM | POA: Diagnosis not present

## 2020-12-06 DIAGNOSIS — I10 Essential (primary) hypertension: Secondary | ICD-10-CM | POA: Diagnosis not present

## 2020-12-06 DIAGNOSIS — R7303 Prediabetes: Secondary | ICD-10-CM | POA: Diagnosis not present

## 2020-12-12 DIAGNOSIS — M1712 Unilateral primary osteoarthritis, left knee: Secondary | ICD-10-CM | POA: Diagnosis not present

## 2020-12-16 DIAGNOSIS — Z4509 Encounter for adjustment and management of other cardiac device: Secondary | ICD-10-CM | POA: Diagnosis not present

## 2020-12-16 DIAGNOSIS — R55 Syncope and collapse: Secondary | ICD-10-CM | POA: Diagnosis not present

## 2020-12-16 DIAGNOSIS — Z95818 Presence of other cardiac implants and grafts: Secondary | ICD-10-CM | POA: Diagnosis not present

## 2020-12-20 DIAGNOSIS — R262 Difficulty in walking, not elsewhere classified: Secondary | ICD-10-CM | POA: Diagnosis not present

## 2020-12-20 DIAGNOSIS — M25562 Pain in left knee: Secondary | ICD-10-CM | POA: Diagnosis not present

## 2020-12-20 DIAGNOSIS — R531 Weakness: Secondary | ICD-10-CM | POA: Diagnosis not present

## 2020-12-20 DIAGNOSIS — M25662 Stiffness of left knee, not elsewhere classified: Secondary | ICD-10-CM | POA: Diagnosis not present

## 2020-12-27 DIAGNOSIS — R262 Difficulty in walking, not elsewhere classified: Secondary | ICD-10-CM | POA: Diagnosis not present

## 2020-12-27 DIAGNOSIS — R531 Weakness: Secondary | ICD-10-CM | POA: Diagnosis not present

## 2020-12-27 DIAGNOSIS — M25662 Stiffness of left knee, not elsewhere classified: Secondary | ICD-10-CM | POA: Diagnosis not present

## 2020-12-27 DIAGNOSIS — M25562 Pain in left knee: Secondary | ICD-10-CM | POA: Diagnosis not present

## 2020-12-29 DIAGNOSIS — M25562 Pain in left knee: Secondary | ICD-10-CM | POA: Diagnosis not present

## 2020-12-29 DIAGNOSIS — M25662 Stiffness of left knee, not elsewhere classified: Secondary | ICD-10-CM | POA: Diagnosis not present

## 2020-12-29 DIAGNOSIS — R531 Weakness: Secondary | ICD-10-CM | POA: Diagnosis not present

## 2020-12-29 DIAGNOSIS — R262 Difficulty in walking, not elsewhere classified: Secondary | ICD-10-CM | POA: Diagnosis not present

## 2021-01-01 ENCOUNTER — Other Ambulatory Visit: Payer: Self-pay

## 2021-01-01 ENCOUNTER — Encounter: Payer: Medicare Other | Attending: Family Medicine | Admitting: Dietician

## 2021-01-01 DIAGNOSIS — R7303 Prediabetes: Secondary | ICD-10-CM | POA: Insufficient documentation

## 2021-01-01 NOTE — Progress Notes (Signed)
On 01/01/2021 patient completed a post core session of the Diabetes Prevention Program course virtually with [facility]. By the end of this session patients are able to complete the following objectives:   Virtual Visit via Video Note  I connected with Whitman Hero, August 22, 1933 by a video enabled application and verified that I am speaking with the correct person using two identifiers.  Location: Patient: Virtual Provider: Office  Learning Objectives: Identify different types of stressors List effects of stress on health and healthy lifestyle choices Discuss how stress affects lifestyle choices  Name healthy ways to manage and avoid stressors   Goals:  Record weight taken outside of class.  Track foods and beverages eaten each day in the "Food and Activity Tracker," including calories and fat grams for each item.   Track activity type, minutes you were active, and distance you reached each day in the "Food and Activity Tracker."   Follow-Up Plan: Attend next session.  Email completed "Food and Activity Trackers" before next session to be reviewed by Lifestyle Coach.

## 2021-01-03 DIAGNOSIS — R531 Weakness: Secondary | ICD-10-CM | POA: Diagnosis not present

## 2021-01-03 DIAGNOSIS — M25662 Stiffness of left knee, not elsewhere classified: Secondary | ICD-10-CM | POA: Diagnosis not present

## 2021-01-03 DIAGNOSIS — M25562 Pain in left knee: Secondary | ICD-10-CM | POA: Diagnosis not present

## 2021-01-03 DIAGNOSIS — R262 Difficulty in walking, not elsewhere classified: Secondary | ICD-10-CM | POA: Diagnosis not present

## 2021-01-05 DIAGNOSIS — M25662 Stiffness of left knee, not elsewhere classified: Secondary | ICD-10-CM | POA: Diagnosis not present

## 2021-01-05 DIAGNOSIS — R262 Difficulty in walking, not elsewhere classified: Secondary | ICD-10-CM | POA: Diagnosis not present

## 2021-01-05 DIAGNOSIS — R531 Weakness: Secondary | ICD-10-CM | POA: Diagnosis not present

## 2021-01-05 DIAGNOSIS — M25562 Pain in left knee: Secondary | ICD-10-CM | POA: Diagnosis not present

## 2021-01-10 DIAGNOSIS — M25662 Stiffness of left knee, not elsewhere classified: Secondary | ICD-10-CM | POA: Diagnosis not present

## 2021-01-10 DIAGNOSIS — R262 Difficulty in walking, not elsewhere classified: Secondary | ICD-10-CM | POA: Diagnosis not present

## 2021-01-10 DIAGNOSIS — R531 Weakness: Secondary | ICD-10-CM | POA: Diagnosis not present

## 2021-01-10 DIAGNOSIS — M25562 Pain in left knee: Secondary | ICD-10-CM | POA: Diagnosis not present

## 2021-01-12 DIAGNOSIS — R262 Difficulty in walking, not elsewhere classified: Secondary | ICD-10-CM | POA: Diagnosis not present

## 2021-01-12 DIAGNOSIS — M25562 Pain in left knee: Secondary | ICD-10-CM | POA: Diagnosis not present

## 2021-01-12 DIAGNOSIS — R531 Weakness: Secondary | ICD-10-CM | POA: Diagnosis not present

## 2021-01-12 DIAGNOSIS — M25662 Stiffness of left knee, not elsewhere classified: Secondary | ICD-10-CM | POA: Diagnosis not present

## 2021-01-16 DIAGNOSIS — Z95818 Presence of other cardiac implants and grafts: Secondary | ICD-10-CM | POA: Diagnosis not present

## 2021-01-16 DIAGNOSIS — Z4509 Encounter for adjustment and management of other cardiac device: Secondary | ICD-10-CM | POA: Diagnosis not present

## 2021-01-16 DIAGNOSIS — R55 Syncope and collapse: Secondary | ICD-10-CM | POA: Diagnosis not present

## 2021-01-17 DIAGNOSIS — M25662 Stiffness of left knee, not elsewhere classified: Secondary | ICD-10-CM | POA: Diagnosis not present

## 2021-01-17 DIAGNOSIS — R262 Difficulty in walking, not elsewhere classified: Secondary | ICD-10-CM | POA: Diagnosis not present

## 2021-01-17 DIAGNOSIS — M25562 Pain in left knee: Secondary | ICD-10-CM | POA: Diagnosis not present

## 2021-01-17 DIAGNOSIS — R531 Weakness: Secondary | ICD-10-CM | POA: Diagnosis not present

## 2021-01-19 DIAGNOSIS — R262 Difficulty in walking, not elsewhere classified: Secondary | ICD-10-CM | POA: Diagnosis not present

## 2021-01-19 DIAGNOSIS — M25662 Stiffness of left knee, not elsewhere classified: Secondary | ICD-10-CM | POA: Diagnosis not present

## 2021-01-19 DIAGNOSIS — R531 Weakness: Secondary | ICD-10-CM | POA: Diagnosis not present

## 2021-01-19 DIAGNOSIS — M25562 Pain in left knee: Secondary | ICD-10-CM | POA: Diagnosis not present

## 2021-01-23 DIAGNOSIS — M25662 Stiffness of left knee, not elsewhere classified: Secondary | ICD-10-CM | POA: Diagnosis not present

## 2021-01-23 DIAGNOSIS — R531 Weakness: Secondary | ICD-10-CM | POA: Diagnosis not present

## 2021-01-23 DIAGNOSIS — M25562 Pain in left knee: Secondary | ICD-10-CM | POA: Diagnosis not present

## 2021-01-23 DIAGNOSIS — R262 Difficulty in walking, not elsewhere classified: Secondary | ICD-10-CM | POA: Diagnosis not present

## 2021-01-26 DIAGNOSIS — R531 Weakness: Secondary | ICD-10-CM | POA: Diagnosis not present

## 2021-01-26 DIAGNOSIS — M25662 Stiffness of left knee, not elsewhere classified: Secondary | ICD-10-CM | POA: Diagnosis not present

## 2021-01-26 DIAGNOSIS — R262 Difficulty in walking, not elsewhere classified: Secondary | ICD-10-CM | POA: Diagnosis not present

## 2021-01-26 DIAGNOSIS — M25562 Pain in left knee: Secondary | ICD-10-CM | POA: Diagnosis not present

## 2021-01-31 DIAGNOSIS — R2689 Other abnormalities of gait and mobility: Secondary | ICD-10-CM | POA: Diagnosis not present

## 2021-01-31 DIAGNOSIS — M25562 Pain in left knee: Secondary | ICD-10-CM | POA: Diagnosis not present

## 2021-01-31 DIAGNOSIS — M1712 Unilateral primary osteoarthritis, left knee: Secondary | ICD-10-CM | POA: Diagnosis not present

## 2021-01-31 DIAGNOSIS — M25662 Stiffness of left knee, not elsewhere classified: Secondary | ICD-10-CM | POA: Diagnosis not present

## 2021-01-31 DIAGNOSIS — R262 Difficulty in walking, not elsewhere classified: Secondary | ICD-10-CM | POA: Diagnosis not present

## 2021-01-31 DIAGNOSIS — R531 Weakness: Secondary | ICD-10-CM | POA: Diagnosis not present

## 2021-01-31 DIAGNOSIS — M545 Low back pain, unspecified: Secondary | ICD-10-CM | POA: Diagnosis not present

## 2021-02-02 DIAGNOSIS — M25662 Stiffness of left knee, not elsewhere classified: Secondary | ICD-10-CM | POA: Diagnosis not present

## 2021-02-02 DIAGNOSIS — M25562 Pain in left knee: Secondary | ICD-10-CM | POA: Diagnosis not present

## 2021-02-02 DIAGNOSIS — R531 Weakness: Secondary | ICD-10-CM | POA: Diagnosis not present

## 2021-02-02 DIAGNOSIS — R262 Difficulty in walking, not elsewhere classified: Secondary | ICD-10-CM | POA: Diagnosis not present

## 2021-02-05 ENCOUNTER — Telehealth: Payer: Self-pay

## 2021-02-05 ENCOUNTER — Ambulatory Visit: Payer: Medicare Other | Admitting: Student

## 2021-02-05 ENCOUNTER — Encounter: Payer: Medicare Other | Attending: Family Medicine

## 2021-02-05 DIAGNOSIS — R7303 Prediabetes: Secondary | ICD-10-CM | POA: Insufficient documentation

## 2021-02-05 DIAGNOSIS — R001 Bradycardia, unspecified: Secondary | ICD-10-CM | POA: Diagnosis not present

## 2021-02-05 DIAGNOSIS — R55 Syncope and collapse: Secondary | ICD-10-CM | POA: Diagnosis not present

## 2021-02-05 NOTE — Progress Notes (Signed)
Date: 02/06/21 Last Office Visit: 05/18/2020  Chief Complaint  Patient presents with   Loss of Consciousness   HPI  April Douglas is a 87 y.o. female who presents to the office with a chief complaint of " 9-monthfollow-up for blood pressure management." Patient's past medical history and cardiac risk factors include: Hypertension, hyperlipidemia, history of syncope, postmenopausal female, advanced age.  Patient is accompanied by her husband at today's office visit. Patient daughter also joined over the phone.   Patient presents for urgent visit, due to syncopal episode yesterday morning around 8:00 AM.  Patient reports she was preparing her breakfast plate and reached for the counter stool to sit down and eat when she started to "not feel well".  Patient's husband witnessed this event and noted patient to be leaning against the wall and unable to respond to his questions, patient's husband therefore laid patient on the ground and within approximately 1 minute she was talking to him.  Patient denies postictal state, bowel or bladder incontinence.  Patient states this felt similar to previous episodes of syncope she has had.  Notably at last visit Dr. TTerri Skainshad discontinued diltiazem and started patient on amlodipine 5 mg each afternoon, however upon further questioning patient has now been taking both diltiazem and amlodipine.  At today's office visit patient's blood pressure is soft, although she is not orthostatic.  She reports home blood pressure readings have also been soft lately staying approximately 1818mmHg systolic.  I personally reviewed loop recorder transmissions, there is no abnormalities noted yesterday morning.   ALLERGIES: No Known Allergies   MEDICATION LIST PRIOR TO VISIT: Current Outpatient Medications on File Prior to Visit  Medication Sig Dispense Refill   acetaminophen (TYLENOL) 500 MG tablet Take 500 mg by mouth every 6 (six) hours as needed.     amLODipine  (NORVASC) 5 MG tablet Take 1 tablet (5 mg total) by mouth every evening. 90 tablet 0   calcium carbonate (OSCAL) 1500 (600 Ca) MG TABS tablet Take by mouth 2 (two) times daily with a meal.     carboxymethylcellulose (REFRESH PLUS) 0.5 % SOLN Place 1 drop into both eyes 2 (two) times daily as needed (dry eyes).     cetirizine (ZYRTEC) 10 MG tablet Take 10 mg by mouth daily.     Cholecalciferol (VITAMIN D3) 50 MCG (2000 UT) CHEW Chew by mouth.     hydrALAZINE (APRESOLINE) 100 MG tablet TAKE 1 TABLET(100 MG) BY MOUTH THREE TIMES DAILY 90 tablet 5   memantine (NAMENDA) 10 MG tablet Take 10 mg by mouth 2 (two) times daily.     Multiple Vitamin (MULTIVITAMIN WITH MINERALS) TABS tablet Take 1 tablet by mouth daily.     olmesartan (BENICAR) 40 MG tablet TAKE 1 TABLET(40 MG) BY MOUTH EVERY EVENING 90 tablet 1   Omega-3 Fatty Acids (SALMON OIL-1000 PO) Take by mouth daily.     Turmeric POWD 1 Scoop     No current facility-administered medications on file prior to visit.    PAST MEDICAL HISTORY: Past Medical History:  Diagnosis Date   Dehydration    Dizziness    Elevated serum creatinine    Encounter for loop recorder check 06/27/2019   Glaucoma    Hyperlipidemia    Hypertension    Loop recorder [place]ron[place] 05/21/19 06/08/2019   Right bundle branch block    [place]    Syncope and collapse 10/23/2018    PAST SURGICAL HISTORY: Past Surgical History:  Procedure Laterality Date   ABDOMINAL HYSTERECTOMY     TAH-BSO   CATARACT EXTRACTION Bilateral    COLONOSCOPY WITH PROPOFOL N/A 02/06/2015   Procedure: COLONOSCOPY WITH PROPOFOL;  Surgeon: Garlan Fair, MD;  Location: [facility];  Service: Endoscopy;  Laterality: N/A;   LOOP RECORDER IMPLANT  05/2019   tooth extraction  2020    FAMILY HISTORY: The patient family history includes Cancer in her brother and brother; Heart disease in her brother; Hypertension in her father, mother, and sister; Stroke in her sister.    SOCIAL HISTORY:  The patient  reports that she has never smoked. She has never used smokeless tobacco. She reports that she does not drink alcohol and does not use drugs.  Review of Systems  Constitutional: Negative for chills and fever.  HENT:  Negative for hoarse voice and nosebleeds.   Eyes:  Negative for discharge, double vision and pain.  Cardiovascular:  Positive for syncope. Negative for chest pain, claudication, dyspnea on exertion, leg swelling, near-syncope, orthopnea, palpitations and paroxysmal nocturnal dyspnea.  Respiratory:  Negative for hemoptysis and shortness of breath.   Musculoskeletal:  Negative for muscle cramps and myalgias.  Gastrointestinal:  Negative for abdominal pain, constipation, diarrhea, hematemesis, hematochezia, melena, nausea and vomiting.  Neurological:  Negative for dizziness and light-headedness.   PHYSICAL EXAM: Vitals with BMI 02/06/2021 11/21/2020 05/18/2020  Height _0  _1  _2   Weight 146 lbs 148 lbs 158 lbs 6 oz  BMI 25.05 15.17 61.60  Systolic 737 106 269  Diastolic 63 68 83  Pulse 74 62 71   Orthostatic VS for the past 72 hrs (Last 3 readings):  Orthostatic BP Patient Position BP Location Cuff Size Orthostatic Pulse  02/06/21 0948 102/62 Standing Left Arm Normal 78  02/06/21 0947 108/62 Sitting Left Arm Normal 72  02/06/21 0946 103/57 Supine Left Arm Normal 73    CONSTITUTIONAL: Well-developed and well-nourished. No acute distress.  SKIN: Skin is warm and dry. No rash noted. No cyanosis. No pallor. No jaundice HEAD: Normocephalic and atraumatic.  EYES: No scleral icterus MOUTH/THROAT: Moist oral membranes.  NECK: No JVD present. No thyromegaly noted. No carotid bruits  LYMPHATIC: No visible cervical adenopathy.  CHEST Normal respiratory effort. No intercostal retractions  LUNGS: Clear to auscultation bilaterally.  No stridor. No wheezes. No rales.  CARDIOVASCULAR: Regular rate and rhythm, positive S1-S2, no murmurs rubs or gallops  appreciated.  The loop recorder site is healing well no signs of infection, site is clean dry and intact. ABDOMINAL: No apparent ascites.  EXTREMITIES: No peripheral edema  HEMATOLOGIC: No significant bruising NEUROLOGIC: Oriented to person, place, and time. Nonfocal. Normal muscle tone.  PSYCHIATRIC: Normal mood and affect. Normal behavior. Cooperative Physical exam unchanged compared to previous office visit.  CARDIAC DATABASE: EKG  11/21/2020: Ectopic atrial rhythm, 66 bpm, right bundle branch block, without underlying injury pattern. 02/06/2021: Ectopic atrial rhythm at a rate of 72 bpm.  Right bundle branch block.  Compared to EKG 11/21/2020, no significant change.   Lexiscan myoview stress test 02/11/2018: 1. Lexiscan stress test with low level exercise was performed. Patient reached 93% of the maximum predicted heart rate. No stress symptoms reported. Blood pressure was normal. Stress electrocardiogram showed sinus tachycardia, RBBB, no stress arrhythmias and normal stress repolarization. 2. The overall quality of the study is good. There is no evidence of abnormal lung activity. Stress and rest SPECT images demonstrate homogeneous tracer distribution throughout the myocardium. Gated SPECT imaging reveals normal myocardial thickening and wall  motion. The left ventricular ejection fraction was normal (67%). 3. Low risk study.   Echo at [facility], [place]-01/05/2018- normal left ventricle systolic function, PT-46-56 percent. No regional wall motion abnormalities. Normal diastolic function. Trivial TR, no pulmonary hypertension.  Remote loop recorder transmission 10/14/2020: Predominant rhythm is normal sinus rhythm.  No significant arrhythmias, no atrial fibrillation, no pauses.  No symptoms reported.  Remote loop recorder transmission 01/16/2021:  Predominant rhythm is normal sinus rhythm. Occasional PACs and PVCs and artifact. No symptoms reported. No atrial  fibrillation or heart block.  LABORATORY DATA: No flowsheet data found.  CMP Latest Ref Rng & Units 02/05/2019  Glucose 65 - 99 mg/dL 89  BUN 8 - 27 mg/dL 17  Creatinine 0.57 - 1.00 mg/dL 0.91  Sodium 134 - 144 mmol/L 139  Potassium 3.5 - 5.2 mmol/L 4.3  Chloride 96 - 106 mmol/L 99  CO2 20 - 29 mmol/L 22  Calcium 8.7 - 10.3 mg/dL 9.7   Lipid Panel  05/28/2018-cholesterol-208, HDL-79, LDL-117, triglycerides-64.  External Labs: Collected: 01/10/2020 Creatinine 0.85 mg/dL. eGFR: 77 mL/min per 1.73 m Hemoglobin 14.2 g/dL, hematocrit 42.1% Lipid profile: Total cholesterol 200, triglycerides 64, HDL 74, LDL 114, non-HDL 126 Hemoglobin A1c: 5.70 TSH: 0.83  FINAL MEDICATION LIST END OF ENCOUNTER: No orders of the defined types were placed in this encounter.    Medications Discontinued During This Encounter  Medication Reason   nitrofurantoin, macrocrystal-monohydrate, (MACROBID) 100 MG capsule    diltiazem (CARDIZEM CD) 180 MG 24 hr capsule Discontinued by provider     Current Outpatient Medications:    acetaminophen (TYLENOL) 500 MG tablet, Take 500 mg by mouth every 6 (six) hours as needed., Disp: , Rfl:    amLODipine (NORVASC) 5 MG tablet, Take 1 tablet (5 mg total) by mouth every evening., Disp: 90 tablet, Rfl: 0   calcium carbonate (OSCAL) 1500 (600 Ca) MG TABS tablet, Take by mouth 2 (two) times daily with a meal., Disp: , Rfl:    carboxymethylcellulose (REFRESH PLUS) 0.5 % SOLN, Place 1 drop into both eyes 2 (two) times daily as needed (dry eyes)., Disp: , Rfl:    cetirizine (ZYRTEC) 10 MG tablet, Take 10 mg by mouth daily., Disp: , Rfl:    Cholecalciferol (VITAMIN D3) 50 MCG (2000 UT) CHEW, Chew by mouth., Disp: , Rfl:    hydrALAZINE (APRESOLINE) 100 MG tablet, TAKE 1 TABLET(100 MG) BY MOUTH THREE TIMES DAILY, Disp: 90 tablet, Rfl: 5   memantine (NAMENDA) 10 MG tablet, Take 10 mg by mouth 2 (two) times daily., Disp: , Rfl:    Multiple Vitamin (MULTIVITAMIN WITH MINERALS)  TABS tablet, Take 1 tablet by mouth daily., Disp: , Rfl:    olmesartan (BENICAR) 40 MG tablet, TAKE 1 TABLET(40 MG) BY MOUTH EVERY EVENING, Disp: 90 tablet, Rfl: 1   Omega-3 Fatty Acids (SALMON OIL-1000 PO), Take by mouth daily., Disp: , Rfl:    Turmeric POWD, 1 Scoop, Disp: , Rfl:   IMPRESSION:    ICD-10-CM   1. History of syncope  Z87.898 EKG 12-Lead    2. Essential hypertension  I10       RECOMMENDATIONS: TATTIANNA SCHNARR is a 87 y.o. female whose past medical history and cardiac risk factors include: Hypertension, hyperlipidemia, history of syncope, postmenopausal female, advanced age.  History of syncope I personally reviewed loop recorder transmissions, no abnormal alerts noted yesterday morning at time of syncopal event.  No cardiac arrhythmia identified as etiology of syncope.  We will continue  to monitor loop recorder transmissions Suspect episode of syncope yesterday may be related to hypotension given patient's soft blood pressure readings both in the office and at home. Advised patient to discontinue diltiazem, as Dr. Terri Skains had recommended at last office visit. Will continue amlodipine 5 mg daily.  Essential hypertension Office and home blood pressure readings are soft. Patient has mistakenly been taking both diltiazem and amlodipine, will discontinue diltiazem.  Continue amlodipine 5 mg every afternoon. Continue hydralazine and olmesartan. Advised patient to continue to monitor blood pressure regularly at home as well as monitor for symptoms of hypotension.  Counseled patient regarding signs and symptoms that would warrant urgent or emergent evaluation, she and her husband as well as her daughter verbalized understanding and agreement.   Orders Placed This Encounter  Procedures   EKG 12-Lead   --Continue cardiac medications as reconciled in final medication list. --No follow-ups on file. Or sooner if needed. --Continue follow-up with your primary care physician regarding  the management of your other chronic comorbid conditions.  Patient's questions and concerns were addressed to her satisfaction. She voices understanding of the instructions provided during this encounter.   This note was created using a voice recognition software as a result there may be grammatical errors inadvertently enclosed that do not reflect the nature of this encounter. Every attempt is made to correct such errors.   Alethia Berthold, PA-C 02/06/2021, 12:03 PM Office: [phone]

## 2021-02-05 NOTE — Telephone Encounter (Signed)
Patient's daughter called and said that the patient passed out this morning. EMS was called but patient stabilized so she was not taken to the hospital.

## 2021-02-05 NOTE — Telephone Encounter (Signed)
No data on her loop recorder today. Will check tomorrow.  Have her follow up with either myself of Celeste for further evaluation.

## 2021-02-06 ENCOUNTER — Ambulatory Visit: Payer: Medicare Other | Admitting: Student

## 2021-02-06 ENCOUNTER — Other Ambulatory Visit: Payer: Self-pay

## 2021-02-06 ENCOUNTER — Other Ambulatory Visit: Payer: Self-pay | Admitting: Family Medicine

## 2021-02-06 ENCOUNTER — Encounter: Payer: Self-pay | Admitting: Student

## 2021-02-06 ENCOUNTER — Ambulatory Visit
Admission: RE | Admit: 2021-02-06 | Discharge: 2021-02-06 | Disposition: A | Discharge status: Home / Self Care | Payer: Medicare Other | Source: Ambulatory Visit | Attending: Family Medicine | Admitting: Family Medicine

## 2021-02-06 VITALS — BP 113/63 | HR 74 | Temp 98.0°F | Resp 16 | Ht 64.0 in | Wt 146.0 lb

## 2021-02-06 DIAGNOSIS — Z87898 Personal history of other specified conditions: Secondary | ICD-10-CM | POA: Diagnosis not present

## 2021-02-06 DIAGNOSIS — M25552 Pain in left hip: Secondary | ICD-10-CM | POA: Diagnosis not present

## 2021-02-06 DIAGNOSIS — R52 Pain, unspecified: Secondary | ICD-10-CM

## 2021-02-06 DIAGNOSIS — I10 Essential (primary) hypertension: Secondary | ICD-10-CM | POA: Diagnosis not present

## 2021-02-06 NOTE — Telephone Encounter (Signed)
She came in today

## 2021-02-09 DIAGNOSIS — R531 Weakness: Secondary | ICD-10-CM | POA: Diagnosis not present

## 2021-02-09 DIAGNOSIS — M25662 Stiffness of left knee, not elsewhere classified: Secondary | ICD-10-CM | POA: Diagnosis not present

## 2021-02-09 DIAGNOSIS — M25562 Pain in left knee: Secondary | ICD-10-CM | POA: Diagnosis not present

## 2021-02-09 DIAGNOSIS — R262 Difficulty in walking, not elsewhere classified: Secondary | ICD-10-CM | POA: Diagnosis not present

## 2021-02-12 ENCOUNTER — Telehealth: Payer: Self-pay

## 2021-02-12 NOTE — Telephone Encounter (Signed)
Pt's husband called with bp readings  02/11/21 100/52  pulse 65 95/54 pulse 64 98/50 pulse 66  108/61 pulse 69 108/56 pulse 58 105/65 pulse 55  Pt's husband didn't give her and bp meds this day , pt had no energy  11/28  151/77 pulse 59 137/72 pulse 66  137/80 pulse 69  Took normal meds this day

## 2021-02-12 NOTE — Telephone Encounter (Signed)
Please advise patient to hold medications as they did if systolic BP <953 mmHg. Recommend they continue to trend patient's blood pressure since stopping the diltiazem and keep a written log. Please notify the office if BP remains >140/90 mmHg on average or if she continues to have episodes of hypotension.

## 2021-02-12 NOTE — Telephone Encounter (Signed)
Patient aware.

## 2021-02-13 DIAGNOSIS — R262 Difficulty in walking, not elsewhere classified: Secondary | ICD-10-CM | POA: Diagnosis not present

## 2021-02-13 DIAGNOSIS — M25662 Stiffness of left knee, not elsewhere classified: Secondary | ICD-10-CM | POA: Diagnosis not present

## 2021-02-13 DIAGNOSIS — M25562 Pain in left knee: Secondary | ICD-10-CM | POA: Diagnosis not present

## 2021-02-13 DIAGNOSIS — R531 Weakness: Secondary | ICD-10-CM | POA: Diagnosis not present

## 2021-02-14 ENCOUNTER — Other Ambulatory Visit: Payer: Self-pay | Admitting: Cardiology

## 2021-02-14 DIAGNOSIS — I10 Essential (primary) hypertension: Secondary | ICD-10-CM

## 2021-02-15 DIAGNOSIS — M25562 Pain in left knee: Secondary | ICD-10-CM | POA: Diagnosis not present

## 2021-02-15 DIAGNOSIS — R262 Difficulty in walking, not elsewhere classified: Secondary | ICD-10-CM | POA: Diagnosis not present

## 2021-02-15 DIAGNOSIS — R531 Weakness: Secondary | ICD-10-CM | POA: Diagnosis not present

## 2021-02-15 DIAGNOSIS — M25662 Stiffness of left knee, not elsewhere classified: Secondary | ICD-10-CM | POA: Diagnosis not present

## 2021-02-16 DIAGNOSIS — R55 Syncope and collapse: Secondary | ICD-10-CM | POA: Diagnosis not present

## 2021-02-16 DIAGNOSIS — Z95818 Presence of other cardiac implants and grafts: Secondary | ICD-10-CM | POA: Diagnosis not present

## 2021-02-16 DIAGNOSIS — Z4509 Encounter for adjustment and management of other cardiac device: Secondary | ICD-10-CM | POA: Diagnosis not present

## 2021-02-19 DIAGNOSIS — M545 Low back pain, unspecified: Secondary | ICD-10-CM | POA: Diagnosis not present

## 2021-02-19 DIAGNOSIS — M25662 Stiffness of left knee, not elsewhere classified: Secondary | ICD-10-CM | POA: Diagnosis not present

## 2021-02-19 DIAGNOSIS — R531 Weakness: Secondary | ICD-10-CM | POA: Diagnosis not present

## 2021-02-19 DIAGNOSIS — M25562 Pain in left knee: Secondary | ICD-10-CM | POA: Diagnosis not present

## 2021-02-19 DIAGNOSIS — R262 Difficulty in walking, not elsewhere classified: Secondary | ICD-10-CM | POA: Diagnosis not present

## 2021-02-19 DIAGNOSIS — M62552 Muscle wasting and atrophy, not elsewhere classified, left thigh: Secondary | ICD-10-CM | POA: Diagnosis not present

## 2021-02-22 DIAGNOSIS — M25662 Stiffness of left knee, not elsewhere classified: Secondary | ICD-10-CM | POA: Diagnosis not present

## 2021-02-22 DIAGNOSIS — R531 Weakness: Secondary | ICD-10-CM | POA: Diagnosis not present

## 2021-02-22 DIAGNOSIS — M25562 Pain in left knee: Secondary | ICD-10-CM | POA: Diagnosis not present

## 2021-02-22 DIAGNOSIS — R262 Difficulty in walking, not elsewhere classified: Secondary | ICD-10-CM | POA: Diagnosis not present

## 2021-02-22 DIAGNOSIS — M545 Low back pain, unspecified: Secondary | ICD-10-CM | POA: Diagnosis not present

## 2021-02-22 DIAGNOSIS — M62552 Muscle wasting and atrophy, not elsewhere classified, left thigh: Secondary | ICD-10-CM | POA: Diagnosis not present

## 2021-02-23 DIAGNOSIS — N390 Urinary tract infection, site not specified: Secondary | ICD-10-CM | POA: Diagnosis not present

## 2021-02-23 DIAGNOSIS — N3000 Acute cystitis without hematuria: Secondary | ICD-10-CM | POA: Diagnosis not present

## 2021-02-23 DIAGNOSIS — R3989 Other symptoms and signs involving the genitourinary system: Secondary | ICD-10-CM | POA: Diagnosis not present

## 2021-02-23 DIAGNOSIS — R350 Frequency of micturition: Secondary | ICD-10-CM | POA: Diagnosis not present

## 2021-02-26 ENCOUNTER — Telehealth: Payer: Self-pay

## 2021-02-26 NOTE — Telephone Encounter (Signed)
Pt and her husband called to give bp readings  02/24/21 86/58 hr 64, 104/56 hr 62, 93/48 hr 67 Pt's husband said that when her bp is that low he does not give her medicine and wonders if she is taking too much medication I wasn't sure who to send  this message to. She saw you last, but has f/u with ST.

## 2021-02-26 NOTE — Telephone Encounter (Signed)
Please advise patient to stop amlodipine and notify our office if BP remains low a week or so after stopping the medication.   [name], can you please let me know if there have been any alerts on her loop since last visit here.

## 2021-02-27 ENCOUNTER — Other Ambulatory Visit: Payer: Self-pay | Admitting: Cardiology

## 2021-02-27 DIAGNOSIS — M545 Low back pain, unspecified: Secondary | ICD-10-CM | POA: Diagnosis not present

## 2021-02-27 DIAGNOSIS — I10 Essential (primary) hypertension: Secondary | ICD-10-CM

## 2021-02-27 DIAGNOSIS — R531 Weakness: Secondary | ICD-10-CM | POA: Diagnosis not present

## 2021-02-27 DIAGNOSIS — M25562 Pain in left knee: Secondary | ICD-10-CM | POA: Diagnosis not present

## 2021-02-27 DIAGNOSIS — M25662 Stiffness of left knee, not elsewhere classified: Secondary | ICD-10-CM | POA: Diagnosis not present

## 2021-02-27 DIAGNOSIS — M62552 Muscle wasting and atrophy, not elsewhere classified, left thigh: Secondary | ICD-10-CM | POA: Diagnosis not present

## 2021-02-27 DIAGNOSIS — R262 Difficulty in walking, not elsewhere classified: Secondary | ICD-10-CM | POA: Diagnosis not present

## 2021-03-01 DIAGNOSIS — M25562 Pain in left knee: Secondary | ICD-10-CM | POA: Diagnosis not present

## 2021-03-01 DIAGNOSIS — M25662 Stiffness of left knee, not elsewhere classified: Secondary | ICD-10-CM | POA: Diagnosis not present

## 2021-03-01 DIAGNOSIS — M62552 Muscle wasting and atrophy, not elsewhere classified, left thigh: Secondary | ICD-10-CM | POA: Diagnosis not present

## 2021-03-01 DIAGNOSIS — R262 Difficulty in walking, not elsewhere classified: Secondary | ICD-10-CM | POA: Diagnosis not present

## 2021-03-01 DIAGNOSIS — R531 Weakness: Secondary | ICD-10-CM | POA: Diagnosis not present

## 2021-03-01 DIAGNOSIS — M545 Low back pain, unspecified: Secondary | ICD-10-CM | POA: Diagnosis not present

## 2021-03-05 ENCOUNTER — Other Ambulatory Visit: Payer: Self-pay

## 2021-03-05 ENCOUNTER — Encounter: Payer: Medicare Other | Attending: Family Medicine | Admitting: Dietician

## 2021-03-05 DIAGNOSIS — R7303 Prediabetes: Secondary | ICD-10-CM

## 2021-03-05 NOTE — Progress Notes (Signed)
On 03/05/2021 patient completed a post core session of the Diabetes Prevention Program course virtually with [facility]. By the end of this session patients are able to complete the following objectives:   Virtual Visit via Video Note  I connected with April Douglas, [birth year] by a video enabled application and verified that I am speaking with the correct person using two identifiers.  Location: Patient: Virtual Provider: Office  Learning Objectives: Counter self-defeating thoughts with positive self-statements Define assertiveness.  List examples of ways to practice assertiveness.   Goals:  Record weight taken outside of class.  Track foods and beverages eaten each day in the "Food and Activity Tracker," including calories and fat grams for each item.   Track activity type, minutes you were active, and distance you reached each day in the "Food and Activity Tracker."   Follow-Up Plan: Attend next session.  Email completed "Food and Activity Trackers" before next session to be reviewed by Lifestyle [facility].

## 2021-03-08 DIAGNOSIS — M1612 Unilateral primary osteoarthritis, left hip: Secondary | ICD-10-CM | POA: Diagnosis not present

## 2021-03-19 DIAGNOSIS — Z95818 Presence of other cardiac implants and grafts: Secondary | ICD-10-CM | POA: Diagnosis not present

## 2021-03-19 DIAGNOSIS — Z4509 Encounter for adjustment and management of other cardiac device: Secondary | ICD-10-CM | POA: Diagnosis not present

## 2021-03-19 DIAGNOSIS — R55 Syncope and collapse: Secondary | ICD-10-CM | POA: Diagnosis not present

## 2021-03-20 ENCOUNTER — Telehealth: Payer: Self-pay | Admitting: Cardiology

## 2021-03-20 NOTE — Telephone Encounter (Signed)
Pt is calling because she has been keeping a record of her BP numbers and she said they are going all over and is requesting [place] let celeste know and give her a call back.

## 2021-03-21 DIAGNOSIS — N3000 Acute cystitis without hematuria: Secondary | ICD-10-CM | POA: Diagnosis not present

## 2021-03-22 NOTE — Telephone Encounter (Signed)
Pt husband states he checks the pts BP 3 times a day. He is concerned because some of the Bp readings seem to be all over the place. He states that there is an alarming drop usually around 8pm. On Dec 13th her BP were 104/56 pulse of 64, 96/55 pulse 63, and 97/50 pulse of 65. He stated that when this happens, he usually holds the BP medication. Bp machine occasionally states that the HR is irregular as well and they are concerned because the pt has not been receiving any calls about the pts device checks. They will be bringing in a full BP log tomorrow afternoon you you to review.

## 2021-03-23 NOTE — Telephone Encounter (Signed)
Remote loop recorder transmission 03/19/2021: Predominant rhythm is normal sinus rhythm. False atrial fibrillation. 2 patient transmissions reveal normal sinus rhythm. No atrial fibrillation, no heart block.  Please advise patient that loop recorder has showed no evidence of atrial fibrillation or significant cardiac arrhythmias.  In regard to blood pressure management, please set her up for an appointment in the next 1 to 2 weeks, she would be a good patient to enroll in remote monitoring with [employer].

## 2021-03-23 NOTE — Telephone Encounter (Signed)
Called and spoke to pt, pt is aware. Office visit will be 03/30/2021. Teny agreed to speak with her when she comes in for her OV.

## 2021-03-26 DIAGNOSIS — M1811 Unilateral primary osteoarthritis of first carpometacarpal joint, right hand: Secondary | ICD-10-CM | POA: Diagnosis not present

## 2021-03-26 DIAGNOSIS — N390 Urinary tract infection, site not specified: Secondary | ICD-10-CM | POA: Diagnosis not present

## 2021-03-26 DIAGNOSIS — I10 Essential (primary) hypertension: Secondary | ICD-10-CM | POA: Diagnosis not present

## 2021-03-29 NOTE — Progress Notes (Signed)
Date: 03/30/21 Last Office Visit: 05/18/2020  Chief Complaint  Patient presents with   Hypertension   HPI  April Douglas is a 88 y.o. female who presents to the office with a chief complaint of " 23-monthfollow-up for blood pressure management." Patient's past medical history and cardiac risk factors include: Hypertension, hyperlipidemia, history of syncope, postmenopausal female, advanced age.  Patient is accompanied by her husband at today's office visit. Patient daughter also joined over the phone.   Patient was last seen in our office 02/06/2021 at which time she was advised to discontinue diltiazem as she was taking both it and amlodipine.  She now presents for another urgent visit with concerns of hypertension on home blood pressure monitoring.  Patient is enrolled in remote patient monitoring with our office.  Review of home readings notes a spike up to 1536mmHg systolic when she missed a dose of hydralazine.  This prompted concern from the patient and her husband.  Since then patient has been compliant with medications and blood pressure is well controlled.  She has had no recurrence of syncope or near syncope, dizziness, lightheadedness.  Denies chest pain, palpitations, dyspnea.  She has been following with PCP for management of recurrent UTIs, has recently been referred to urology for further evaluation.  I personally reviewed loop recorder transmissions, no evidence of atrial fibrillation, patient does have PVCs which are asymptomatic.  ALLERGIES: No Known Allergies   MEDICATION LIST PRIOR TO VISIT: Current Outpatient Medications on File Prior to Visit  Medication Sig Dispense Refill   acetaminophen (TYLENOL) 500 MG tablet Take 500 mg by mouth every 6 (six) hours as needed.     calcium carbonate (OSCAL) 1500 (600 Ca) MG TABS tablet Take by mouth 2 (two) times daily with a meal.     carboxymethylcellulose (REFRESH PLUS) 0.5 % SOLN Place 1 drop into both eyes 2 (two) times  daily as needed (dry eyes).     cetirizine (ZYRTEC) 10 MG tablet Take 10 mg by mouth daily.     Cholecalciferol (VITAMIN D3) 50 MCG (2000 UT) CHEW Chew by mouth.     cycloSPORINE (RESTASIS) 0.05 % ophthalmic emulsion 1 drop 2 (two) times daily.     fluticasone (FLONASE) 50 MCG/ACT nasal spray Place into both nostrils daily.     hydrALAZINE (APRESOLINE) 100 MG tablet TAKE 1 TABLET(100 MG) BY MOUTH THREE TIMES DAILY 90 tablet 5   loratadine (CLARITIN) 10 MG tablet Take 10 mg by mouth daily.     memantine (NAMENDA) 10 MG tablet Take 10 mg by mouth 2 (two) times daily.     Menthol, Topical Analgesic, (BIOFREEZE ROLL-ON) 4 % GEL Apply topically.     Multiple Vitamin (MULTIVITAMIN WITH MINERALS) TABS tablet Take 1 tablet by mouth daily.     olmesartan (BENICAR) 40 MG tablet TAKE 1 TABLET(40 MG) BY MOUTH EVERY EVENING 90 tablet 1   Omega-3 Fatty Acids (SALMON OIL-1000 PO) Take by mouth daily.     Turmeric POWD 1 Scoop (Patient not taking: Reported on 03/30/2021)     No current facility-administered medications on file prior to visit.    PAST MEDICAL HISTORY: Past Medical History:  Diagnosis Date   Dehydration    Dizziness    Elevated serum creatinine    Encounter for loop recorder check 06/27/2019   Glaucoma    Hyperlipidemia    Hypertension    Loop recorder Biotron[place] 05/21/19 06/08/2019   Right bundle branch block    Seasonal affective  disorder ([place])    Syncope and collapse 10/23/2018    PAST SURGICAL HISTORY: Past Surgical History:  Procedure Laterality Date   ABDOMINAL HYSTERECTOMY     TAH-BSO   CATARACT EXTRACTION Bilateral    COLONOSCOPY WITH PROPOFOL N/A 02/06/2015   Procedure: COLONOSCOPY WITH PROPOFOL;  Surgeon: Garlan Fair, MD;  Location: [facility];  Service: Endoscopy;  Laterality: N/A;   LOOP RECORDER IMPLANT  05/2019   tooth extraction  2020    FAMILY HISTORY: The patient family history includes Cancer in her brother and brother; Heart disease in her brother;  Hypertension in her father, mother, and sister; Stroke in her sister.   SOCIAL HISTORY:  The patient  reports that she has never smoked. She has never used smokeless tobacco. She reports that she does not drink alcohol and does not use drugs.  Review of Systems  Constitutional: Negative for chills and fever.  HENT:  Negative for hoarse voice and nosebleeds.   Eyes:  Negative for discharge, double vision and pain.  Cardiovascular:  Negative for chest pain, claudication, dyspnea on exertion, leg swelling, near-syncope, orthopnea, palpitations, paroxysmal nocturnal dyspnea and syncope (no recurrence).  Respiratory:  Negative for hemoptysis and shortness of breath.   Musculoskeletal:  Negative for muscle cramps and myalgias.  Gastrointestinal:  Negative for abdominal pain, constipation, diarrhea, hematemesis, hematochezia, melena, nausea and vomiting.  Neurological:  Negative for dizziness and light-headedness.   PHYSICAL EXAM: Vitals with BMI 03/30/2021 02/06/2021 11/21/2020  Height 5' 4" 5' 4" 5' 4"  Weight 149 lbs 146 lbs 148 lbs  BMI 25.56 08.65 78.46  Systolic 962 952 841  Diastolic 72 63 68  Pulse 77 74 62   Orthostatic VS for the past 72 hrs (Last 3 readings):  Patient Position BP Location Cuff Size  03/30/21 0942 Sitting Left Arm Normal    CONSTITUTIONAL: Well-developed and well-nourished. No acute distress.  SKIN: Skin is warm and dry. No rash noted. No cyanosis. No pallor. No jaundice HEAD: Normocephalic and atraumatic.  EYES: No scleral icterus MOUTH/THROAT: Moist oral membranes.  NECK: No JVD present. No thyromegaly noted. No carotid bruits  LYMPHATIC: No visible cervical adenopathy.  CHEST Normal respiratory effort. No intercostal retractions  LUNGS: Clear to auscultation bilaterally.  No stridor. No wheezes. No rales.  CARDIOVASCULAR: Regular rate and rhythm, positive S1-S2, no murmurs rubs or gallops appreciated.  The loop recorder site is healing well no signs of  infection, site is clean dry and intact. ABDOMINAL: No apparent ascites.  EXTREMITIES: No peripheral edema  HEMATOLOGIC: No significant bruising NEUROLOGIC: Oriented to person, place, and time. Nonfocal. Normal muscle tone.  PSYCHIATRIC: Normal mood and affect. Normal behavior. Cooperative Physical exam unchanged compared to previous office visit.  CARDIAC DATABASE: EKG  11/21/2020: Ectopic atrial rhythm, 66 bpm, right bundle branch block, without underlying injury pattern. 02/06/2021: Ectopic atrial rhythm at a rate of 72 bpm.  Right bundle branch block.  Compared to EKG 11/21/2020, no significant change.   Lexiscan myoview stress test 02/11/2018: 1. Lexiscan stress test with low level exercise was performed. Patient reached 93% of the maximum predicted heart rate. No stress symptoms reported. Blood pressure was normal. Stress electrocardiogram showed sinus tachycardia, RBBB, no stress arrhythmias and normal stress repolarization. 2. The overall quality of the study is good. There is no evidence of abnormal lung activity. Stress and rest SPECT images demonstrate homogeneous tracer distribution throughout the myocardium. Gated SPECT imaging reveals normal myocardial thickening and wall motion. The left ventricular ejection fraction was  normal (67%). 3. Low risk study.   Echo at [facility], [place]-01/05/2018- normal left ventricle systolic function, WC-37-62 percent. No regional wall motion abnormalities. Normal diastolic function. Trivial TR, no pulmonary hypertension.  Loop Recorder: Remote loop recorder transmission 10/14/2020: Predominant rhythm is normal sinus rhythm.  No significant arrhythmias, no atrial fibrillation, no pauses.  No symptoms reported.  Remote loop recorder transmission 01/16/2021:  Predominant rhythm is normal sinus rhythm. Occasional PACs and PVCs and artifact. No symptoms reported. No atrial fibrillation or heart block.  Remote loop recorder  transmission 03/19/2021: Predominant rhythm is normal sinus rhythm. False atrial fibrillation. 2 patient transmissions reveal normal sinus rhythm. No atrial fibrillation, no heart block.  LABORATORY DATA: No flowsheet data found.  CMP Latest Ref Rng & Units 02/05/2019  Glucose 65 - 99 mg/dL 89  BUN 8 - 27 mg/dL 17  Creatinine 0.57 - 1.00 mg/dL 0.91  Sodium 134 - 144 mmol/L 139  Potassium 3.5 - 5.2 mmol/L 4.3  Chloride 96 - 106 mmol/L 99  CO2 20 - 29 mmol/L 22  Calcium 8.7 - 10.3 mg/dL 9.7   Lipid Panel  05/28/2018-cholesterol-208, HDL-79, LDL-117, triglycerides-64.  External Labs: Collected: 01/10/2020 Creatinine 0.85 mg/dL. eGFR: 77 mL/min per 1.73 m Hemoglobin 14.2 g/dL, hematocrit 42.1% Lipid profile: Total cholesterol 200, triglycerides 64, HDL 74, LDL 114, non-HDL 126 Hemoglobin A1c: 5.70 TSH: 0.83  FINAL MEDICATION LIST END OF ENCOUNTER: No orders of the defined types were placed in this encounter.     There are no discontinued medications.    Current Outpatient Medications:    acetaminophen (TYLENOL) 500 MG tablet, Take 500 mg by mouth every 6 (six) hours as needed., Disp: , Rfl:    calcium carbonate (OSCAL) 1500 (600 Ca) MG TABS tablet, Take by mouth 2 (two) times daily with a meal., Disp: , Rfl:    carboxymethylcellulose (REFRESH PLUS) 0.5 % SOLN, Place 1 drop into both eyes 2 (two) times daily as needed (dry eyes)., Disp: , Rfl:    cetirizine (ZYRTEC) 10 MG tablet, Take 10 mg by mouth daily., Disp: , Rfl:    Cholecalciferol (VITAMIN D3) 50 MCG (2000 UT) CHEW, Chew by mouth., Disp: , Rfl:    cycloSPORINE (RESTASIS) 0.05 % ophthalmic emulsion, 1 drop 2 (two) times daily., Disp: , Rfl:    fluticasone (FLONASE) 50 MCG/ACT nasal spray, Place into both nostrils daily., Disp: , Rfl:    hydrALAZINE (APRESOLINE) 100 MG tablet, TAKE 1 TABLET(100 MG) BY MOUTH THREE TIMES DAILY, Disp: 90 tablet, Rfl: 5   loratadine (CLARITIN) 10 MG tablet, Take 10 mg by mouth daily., Disp: ,  Rfl:    memantine (NAMENDA) 10 MG tablet, Take 10 mg by mouth 2 (two) times daily., Disp: , Rfl:    Menthol, Topical Analgesic, (BIOFREEZE ROLL-ON) 4 % GEL, Apply topically., Disp: , Rfl:    Multiple Vitamin (MULTIVITAMIN WITH MINERALS) TABS tablet, Take 1 tablet by mouth daily., Disp: , Rfl:    olmesartan (BENICAR) 40 MG tablet, TAKE 1 TABLET(40 MG) BY MOUTH EVERY EVENING, Disp: 90 tablet, Rfl: 1   Omega-3 Fatty Acids (SALMON OIL-1000 PO), Take by mouth daily., Disp: , Rfl:    Turmeric POWD, 1 Scoop (Patient not taking: Reported on 03/30/2021), Disp: , Rfl:   IMPRESSION:    ICD-10-CM   1. Essential hypertension  I10       RECOMMENDATIONS: April Douglas is a 88 y.o. female whose past medical history and cardiac risk factors include: Hypertension, hyperlipidemia, history of  syncope, postmenopausal female, advanced age.  History of syncope I personally reviewed loop recorder transmissions.  No evidence of significant cardiac arrhythmias.  Patient does have PVCs which are asymptomatic. Will continue to monitor.  Essential hypertension Patient did have episode of hypertension after missed dose of hydralazine.  She has had no recurrence of hypotension.  No dizziness, lightheadedness, syncope, or near syncope. Blood pressure is well controlled on home monitoring. Continue hydralazine and olmesartan Patient is enrolled with our office in remote patient monitoring of blood pressure, will continue. Notably patient does express desire to get rid of midday medications in the future if able.  We will hold off at this time as she is stable with current regimen.   No orders of the defined types were placed in this encounter.  --Continue cardiac medications as reconciled in final medication list. --Return in about 6 months (around 09/27/2021) for HTN, loop recorder. Or sooner if needed. --Continue follow-up with your primary care physician regarding the management of your other chronic comorbid  conditions.  Patient's questions and concerns were addressed to her satisfaction. She voices understanding of the instructions provided during this encounter.   This note was created using a voice recognition software as a result there may be grammatical errors inadvertently enclosed that do not reflect the nature of this encounter. Every attempt is made to correct such errors.   Alethia Berthold, PA-C 03/30/2021, 10:14 AM Office: [phone]

## 2021-03-30 ENCOUNTER — Ambulatory Visit: Payer: Medicare Other | Admitting: Student

## 2021-03-30 ENCOUNTER — Other Ambulatory Visit: Payer: Self-pay

## 2021-03-30 ENCOUNTER — Encounter: Payer: Self-pay | Admitting: Student

## 2021-03-30 VITALS — BP 139/72 | HR 77 | Ht 64.0 in | Wt 149.0 lb

## 2021-03-30 DIAGNOSIS — I10 Essential (primary) hypertension: Secondary | ICD-10-CM

## 2021-04-01 DIAGNOSIS — N3000 Acute cystitis without hematuria: Secondary | ICD-10-CM | POA: Diagnosis not present

## 2021-04-02 ENCOUNTER — Other Ambulatory Visit: Payer: Self-pay

## 2021-04-02 ENCOUNTER — Encounter: Payer: Medicare Other | Attending: Family Medicine | Admitting: Dietician

## 2021-04-02 DIAGNOSIS — R7303 Prediabetes: Secondary | ICD-10-CM | POA: Diagnosis not present

## 2021-04-02 NOTE — Progress Notes (Signed)
On 04/02/2021 pt completed a post core session of the Diabetes Prevention Program course virtually with [facility]. By the end of this session patients are able to complete the following objectives:   Virtual Visit via Video Note  I connected with April Douglas, [birth year] by a video enabled application and verified that I am speaking with the correct person using two identifiers.  Location: Patient: Virtual Provider: Office  Learning Objectives: Reflect on lifestyle changes they have made since starting the DPP.  Set long-term goals to promote continued maintenance of lifestyle changes made during the program.   Goals:  Work toward reaching new long-term goals set during class.   Follow-Up Plan: Contact Lifestyle [facility] with questions/concerns PRN.

## 2021-04-11 DIAGNOSIS — M25552 Pain in left hip: Secondary | ICD-10-CM | POA: Diagnosis not present

## 2021-04-19 DIAGNOSIS — R5383 Other fatigue: Secondary | ICD-10-CM | POA: Diagnosis not present

## 2021-04-19 DIAGNOSIS — Z4509 Encounter for adjustment and management of other cardiac device: Secondary | ICD-10-CM | POA: Diagnosis not present

## 2021-04-19 DIAGNOSIS — E871 Hypo-osmolality and hyponatremia: Secondary | ICD-10-CM | POA: Diagnosis not present

## 2021-04-19 DIAGNOSIS — R55 Syncope and collapse: Secondary | ICD-10-CM | POA: Diagnosis not present

## 2021-04-19 DIAGNOSIS — Z95818 Presence of other cardiac implants and grafts: Secondary | ICD-10-CM | POA: Diagnosis not present

## 2021-04-19 DIAGNOSIS — I10 Essential (primary) hypertension: Secondary | ICD-10-CM | POA: Diagnosis not present

## 2021-04-22 DIAGNOSIS — I10 Essential (primary) hypertension: Secondary | ICD-10-CM | POA: Diagnosis not present

## 2021-04-30 DIAGNOSIS — N3020 Other chronic cystitis without hematuria: Secondary | ICD-10-CM | POA: Diagnosis not present

## 2021-04-30 DIAGNOSIS — N952 Postmenopausal atrophic vaginitis: Secondary | ICD-10-CM | POA: Diagnosis not present

## 2021-05-20 DIAGNOSIS — Z95818 Presence of other cardiac implants and grafts: Secondary | ICD-10-CM | POA: Diagnosis not present

## 2021-05-20 DIAGNOSIS — R55 Syncope and collapse: Secondary | ICD-10-CM | POA: Diagnosis not present

## 2021-05-20 DIAGNOSIS — Z4509 Encounter for adjustment and management of other cardiac device: Secondary | ICD-10-CM | POA: Diagnosis not present

## 2021-05-22 ENCOUNTER — Telehealth: Payer: Self-pay | Admitting: Family Medicine

## 2021-05-22 ENCOUNTER — Ambulatory Visit: Payer: Medicare Other | Admitting: Cardiology

## 2021-05-22 ENCOUNTER — Other Ambulatory Visit: Payer: Self-pay

## 2021-05-22 DIAGNOSIS — I10 Essential (primary) hypertension: Secondary | ICD-10-CM | POA: Diagnosis not present

## 2021-05-22 MED ORDER — OLMESARTAN MEDOXOMIL 40 MG PO TABS: ORAL_TABLET | ORAL | 1 refills | Status: DC

## 2021-05-22 NOTE — Telephone Encounter (Signed)
Refill has been sent.  °

## 2021-05-22 NOTE — Telephone Encounter (Signed)
Refill for  ? ?olmesartan (BENICAR) 40 MG tablet ? ? ?[id number] [address], [address] [place]  [phone] ?

## 2021-06-05 ENCOUNTER — Telehealth: Payer: Self-pay

## 2021-06-05 NOTE — Telephone Encounter (Signed)
Called and reviewed with pt and pt's husband. Pt to cut their amlodipine 5 mg in 1/2 and start taking amlodipine 2.5 mg daily starting this afternoon. Pt to continue monitoring their home BP readings and will follow up closely as needed.

## 2021-06-05 NOTE — Telephone Encounter (Signed)
Home BP have been recently trending up over the past few days. BP avg since the weekend of 153/84 HR: 58. BP yesterday afternoon of 177/93, HR: 58, but has improved to 159/89 HR: 56 while on the call. Previously well controlled on hydralazine 100 mg TID and Olmesartan 40 mg. Pt denies any recent changes in her compliance, diet, or lifestyle changes. Denies any complains of CP, HA, acute pain, numbness, weakness. Pt has previously tried amlodipine, diltiazem, imdur, labetalol, lisinopril, HCTZ. Pt was most recently on amlodipine 5 mg, but was stopped due to BP getting too soft. Pt does have some amlodipine tablets still at home. May consider adding amlodipine 2.5 mg daily. Can discuss with pt if okay with the addition.

## 2021-06-05 NOTE — Telephone Encounter (Signed)
I am okay with adding the amlodipine, feel free to discuss with patient and keep me posted. Thank you

## 2021-06-06 DIAGNOSIS — M1712 Unilateral primary osteoarthritis, left knee: Secondary | ICD-10-CM | POA: Diagnosis not present

## 2021-06-06 DIAGNOSIS — M1612 Unilateral primary osteoarthritis, left hip: Secondary | ICD-10-CM | POA: Diagnosis not present

## 2021-06-12 DIAGNOSIS — I10 Essential (primary) hypertension: Secondary | ICD-10-CM | POA: Diagnosis not present

## 2021-06-12 DIAGNOSIS — Z0000 Encounter for general adult medical examination without abnormal findings: Secondary | ICD-10-CM | POA: Diagnosis not present

## 2021-06-12 DIAGNOSIS — R413 Other amnesia: Secondary | ICD-10-CM | POA: Diagnosis not present

## 2021-06-16 ENCOUNTER — Other Ambulatory Visit: Payer: Self-pay | Admitting: Cardiology

## 2021-06-16 DIAGNOSIS — I10 Essential (primary) hypertension: Secondary | ICD-10-CM

## 2021-06-20 DIAGNOSIS — Z95818 Presence of other cardiac implants and grafts: Secondary | ICD-10-CM | POA: Diagnosis not present

## 2021-06-20 DIAGNOSIS — R55 Syncope and collapse: Secondary | ICD-10-CM | POA: Diagnosis not present

## 2021-06-20 DIAGNOSIS — L811 Chloasma: Secondary | ICD-10-CM | POA: Diagnosis not present

## 2021-06-20 DIAGNOSIS — Z4509 Encounter for adjustment and management of other cardiac device: Secondary | ICD-10-CM | POA: Diagnosis not present

## 2021-06-22 DIAGNOSIS — I10 Essential (primary) hypertension: Secondary | ICD-10-CM | POA: Diagnosis not present

## 2021-06-26 ENCOUNTER — Other Ambulatory Visit: Payer: Self-pay

## 2021-06-26 MED ORDER — AMLODIPINE BESYLATE 2.5 MG PO TABS: 2.5000 mg | ORAL_TABLET | Freq: Every day | ORAL | 3 refills | Status: DC

## 2021-07-21 DIAGNOSIS — Z95818 Presence of other cardiac implants and grafts: Secondary | ICD-10-CM | POA: Diagnosis not present

## 2021-07-21 DIAGNOSIS — R55 Syncope and collapse: Secondary | ICD-10-CM | POA: Diagnosis not present

## 2021-07-21 DIAGNOSIS — Z4509 Encounter for adjustment and management of other cardiac device: Secondary | ICD-10-CM | POA: Diagnosis not present

## 2021-07-22 DIAGNOSIS — I10 Essential (primary) hypertension: Secondary | ICD-10-CM | POA: Diagnosis not present

## 2021-07-26 DIAGNOSIS — N3020 Other chronic cystitis without hematuria: Secondary | ICD-10-CM | POA: Diagnosis not present

## 2021-07-26 DIAGNOSIS — N952 Postmenopausal atrophic vaginitis: Secondary | ICD-10-CM | POA: Diagnosis not present

## 2021-08-14 DIAGNOSIS — M545 Low back pain, unspecified: Secondary | ICD-10-CM | POA: Diagnosis not present

## 2021-08-14 DIAGNOSIS — R2689 Other abnormalities of gait and mobility: Secondary | ICD-10-CM | POA: Diagnosis not present

## 2021-08-14 DIAGNOSIS — M1712 Unilateral primary osteoarthritis, left knee: Secondary | ICD-10-CM | POA: Diagnosis not present

## 2021-08-21 DIAGNOSIS — R55 Syncope and collapse: Secondary | ICD-10-CM | POA: Diagnosis not present

## 2021-08-21 DIAGNOSIS — M545 Low back pain, unspecified: Secondary | ICD-10-CM | POA: Diagnosis not present

## 2021-08-21 DIAGNOSIS — Z95818 Presence of other cardiac implants and grafts: Secondary | ICD-10-CM | POA: Diagnosis not present

## 2021-08-21 DIAGNOSIS — Z4509 Encounter for adjustment and management of other cardiac device: Secondary | ICD-10-CM | POA: Diagnosis not present

## 2021-08-21 DIAGNOSIS — M62552 Muscle wasting and atrophy, not elsewhere classified, left thigh: Secondary | ICD-10-CM | POA: Diagnosis not present

## 2021-08-21 DIAGNOSIS — R2681 Unsteadiness on feet: Secondary | ICD-10-CM | POA: Diagnosis not present

## 2021-08-21 DIAGNOSIS — R269 Unspecified abnormalities of gait and mobility: Secondary | ICD-10-CM | POA: Diagnosis not present

## 2021-08-21 DIAGNOSIS — M62551 Muscle wasting and atrophy, not elsewhere classified, right thigh: Secondary | ICD-10-CM | POA: Diagnosis not present

## 2021-08-21 DIAGNOSIS — M25652 Stiffness of left hip, not elsewhere classified: Secondary | ICD-10-CM | POA: Diagnosis not present

## 2021-08-22 DIAGNOSIS — I10 Essential (primary) hypertension: Secondary | ICD-10-CM | POA: Diagnosis not present

## 2021-08-24 DIAGNOSIS — M62552 Muscle wasting and atrophy, not elsewhere classified, left thigh: Secondary | ICD-10-CM | POA: Diagnosis not present

## 2021-08-24 DIAGNOSIS — M25652 Stiffness of left hip, not elsewhere classified: Secondary | ICD-10-CM | POA: Diagnosis not present

## 2021-08-24 DIAGNOSIS — R269 Unspecified abnormalities of gait and mobility: Secondary | ICD-10-CM | POA: Diagnosis not present

## 2021-08-24 DIAGNOSIS — R2681 Unsteadiness on feet: Secondary | ICD-10-CM | POA: Diagnosis not present

## 2021-08-24 DIAGNOSIS — M545 Low back pain, unspecified: Secondary | ICD-10-CM | POA: Diagnosis not present

## 2021-08-24 DIAGNOSIS — M62551 Muscle wasting and atrophy, not elsewhere classified, right thigh: Secondary | ICD-10-CM | POA: Diagnosis not present

## 2021-08-28 DIAGNOSIS — M62551 Muscle wasting and atrophy, not elsewhere classified, right thigh: Secondary | ICD-10-CM | POA: Diagnosis not present

## 2021-08-28 DIAGNOSIS — M545 Low back pain, unspecified: Secondary | ICD-10-CM | POA: Diagnosis not present

## 2021-08-28 DIAGNOSIS — R2681 Unsteadiness on feet: Secondary | ICD-10-CM | POA: Diagnosis not present

## 2021-08-28 DIAGNOSIS — R269 Unspecified abnormalities of gait and mobility: Secondary | ICD-10-CM | POA: Diagnosis not present

## 2021-08-28 DIAGNOSIS — M62552 Muscle wasting and atrophy, not elsewhere classified, left thigh: Secondary | ICD-10-CM | POA: Diagnosis not present

## 2021-08-28 DIAGNOSIS — M25652 Stiffness of left hip, not elsewhere classified: Secondary | ICD-10-CM | POA: Diagnosis not present

## 2021-08-31 DIAGNOSIS — M62552 Muscle wasting and atrophy, not elsewhere classified, left thigh: Secondary | ICD-10-CM | POA: Diagnosis not present

## 2021-08-31 DIAGNOSIS — M545 Low back pain, unspecified: Secondary | ICD-10-CM | POA: Diagnosis not present

## 2021-08-31 DIAGNOSIS — R269 Unspecified abnormalities of gait and mobility: Secondary | ICD-10-CM | POA: Diagnosis not present

## 2021-08-31 DIAGNOSIS — M25652 Stiffness of left hip, not elsewhere classified: Secondary | ICD-10-CM | POA: Diagnosis not present

## 2021-08-31 DIAGNOSIS — R2681 Unsteadiness on feet: Secondary | ICD-10-CM | POA: Diagnosis not present

## 2021-08-31 DIAGNOSIS — M62551 Muscle wasting and atrophy, not elsewhere classified, right thigh: Secondary | ICD-10-CM | POA: Diagnosis not present

## 2021-09-05 DIAGNOSIS — M62551 Muscle wasting and atrophy, not elsewhere classified, right thigh: Secondary | ICD-10-CM | POA: Diagnosis not present

## 2021-09-05 DIAGNOSIS — M62552 Muscle wasting and atrophy, not elsewhere classified, left thigh: Secondary | ICD-10-CM | POA: Diagnosis not present

## 2021-09-05 DIAGNOSIS — R2681 Unsteadiness on feet: Secondary | ICD-10-CM | POA: Diagnosis not present

## 2021-09-05 DIAGNOSIS — R269 Unspecified abnormalities of gait and mobility: Secondary | ICD-10-CM | POA: Diagnosis not present

## 2021-09-05 DIAGNOSIS — M25652 Stiffness of left hip, not elsewhere classified: Secondary | ICD-10-CM | POA: Diagnosis not present

## 2021-09-05 DIAGNOSIS — M545 Low back pain, unspecified: Secondary | ICD-10-CM | POA: Diagnosis not present

## 2021-09-07 DIAGNOSIS — M25652 Stiffness of left hip, not elsewhere classified: Secondary | ICD-10-CM | POA: Diagnosis not present

## 2021-09-07 DIAGNOSIS — M545 Low back pain, unspecified: Secondary | ICD-10-CM | POA: Diagnosis not present

## 2021-09-07 DIAGNOSIS — R2681 Unsteadiness on feet: Secondary | ICD-10-CM | POA: Diagnosis not present

## 2021-09-07 DIAGNOSIS — R269 Unspecified abnormalities of gait and mobility: Secondary | ICD-10-CM | POA: Diagnosis not present

## 2021-09-07 DIAGNOSIS — M62552 Muscle wasting and atrophy, not elsewhere classified, left thigh: Secondary | ICD-10-CM | POA: Diagnosis not present

## 2021-09-07 DIAGNOSIS — M62551 Muscle wasting and atrophy, not elsewhere classified, right thigh: Secondary | ICD-10-CM | POA: Diagnosis not present

## 2021-09-11 DIAGNOSIS — M545 Low back pain, unspecified: Secondary | ICD-10-CM | POA: Diagnosis not present

## 2021-09-11 DIAGNOSIS — M62551 Muscle wasting and atrophy, not elsewhere classified, right thigh: Secondary | ICD-10-CM | POA: Diagnosis not present

## 2021-09-11 DIAGNOSIS — M25652 Stiffness of left hip, not elsewhere classified: Secondary | ICD-10-CM | POA: Diagnosis not present

## 2021-09-11 DIAGNOSIS — R269 Unspecified abnormalities of gait and mobility: Secondary | ICD-10-CM | POA: Diagnosis not present

## 2021-09-11 DIAGNOSIS — R2681 Unsteadiness on feet: Secondary | ICD-10-CM | POA: Diagnosis not present

## 2021-09-11 DIAGNOSIS — M62552 Muscle wasting and atrophy, not elsewhere classified, left thigh: Secondary | ICD-10-CM | POA: Diagnosis not present

## 2021-09-14 DIAGNOSIS — R269 Unspecified abnormalities of gait and mobility: Secondary | ICD-10-CM | POA: Diagnosis not present

## 2021-09-14 DIAGNOSIS — M62551 Muscle wasting and atrophy, not elsewhere classified, right thigh: Secondary | ICD-10-CM | POA: Diagnosis not present

## 2021-09-14 DIAGNOSIS — M62552 Muscle wasting and atrophy, not elsewhere classified, left thigh: Secondary | ICD-10-CM | POA: Diagnosis not present

## 2021-09-14 DIAGNOSIS — M545 Low back pain, unspecified: Secondary | ICD-10-CM | POA: Diagnosis not present

## 2021-09-14 DIAGNOSIS — M25652 Stiffness of left hip, not elsewhere classified: Secondary | ICD-10-CM | POA: Diagnosis not present

## 2021-09-14 DIAGNOSIS — R2681 Unsteadiness on feet: Secondary | ICD-10-CM | POA: Diagnosis not present

## 2021-09-17 DIAGNOSIS — M25652 Stiffness of left hip, not elsewhere classified: Secondary | ICD-10-CM | POA: Diagnosis not present

## 2021-09-17 DIAGNOSIS — R269 Unspecified abnormalities of gait and mobility: Secondary | ICD-10-CM | POA: Diagnosis not present

## 2021-09-17 DIAGNOSIS — M62551 Muscle wasting and atrophy, not elsewhere classified, right thigh: Secondary | ICD-10-CM | POA: Diagnosis not present

## 2021-09-17 DIAGNOSIS — M62552 Muscle wasting and atrophy, not elsewhere classified, left thigh: Secondary | ICD-10-CM | POA: Diagnosis not present

## 2021-09-17 DIAGNOSIS — M545 Low back pain, unspecified: Secondary | ICD-10-CM | POA: Diagnosis not present

## 2021-09-17 DIAGNOSIS — R2681 Unsteadiness on feet: Secondary | ICD-10-CM | POA: Diagnosis not present

## 2021-09-19 ENCOUNTER — Ambulatory Visit: Payer: Medicare Other | Admitting: Student

## 2021-09-19 ENCOUNTER — Encounter: Payer: Self-pay | Admitting: Student

## 2021-09-19 VITALS — BP 161/70 | HR 78 | Temp 98.3°F | Resp 16 | Ht 64.0 in | Wt 146.0 lb

## 2021-09-19 DIAGNOSIS — R002 Palpitations: Secondary | ICD-10-CM | POA: Diagnosis not present

## 2021-09-19 DIAGNOSIS — Z95818 Presence of other cardiac implants and grafts: Secondary | ICD-10-CM

## 2021-09-19 DIAGNOSIS — I10 Essential (primary) hypertension: Secondary | ICD-10-CM | POA: Diagnosis not present

## 2021-09-19 DIAGNOSIS — Z87898 Personal history of other specified conditions: Secondary | ICD-10-CM | POA: Diagnosis not present

## 2021-09-19 DIAGNOSIS — E871 Hypo-osmolality and hyponatremia: Secondary | ICD-10-CM | POA: Diagnosis not present

## 2021-09-19 NOTE — Progress Notes (Signed)
Date: 09/19/21 Last Office Visit: 05/18/2020  Chief Complaint  Patient presents with   Hypertension   Follow-up    6 month   HPI  April Douglas is a 88 y.o. female who presents to the office with a chief complaint of " 60-monthfollow-up for blood pressure management." Patient's past medical history and cardiac risk factors include: Hypertension, hyperlipidemia, history of syncope, postmenopausal female, advanced age.  Patient is accompanied by her husband at today's office visit.  Patient was last seen in the office 03/30/2021 at which time she had no recurrence of syncope and blood pressure was well controlled, therefore no changes were made.  Following last office visit blood pressure became uncontrolled on home monitoring, therefore added amlodipine 2.5 mg p.o. daily.  She now presents for 678-monthollow-up. She is tolerating addition of amlodipine without issue.  She continues to monitor blood pressure regularly at home.  Although blood pressure is elevated in the office today it is under excellent control on home monitoring.  Patient is without specific complaints today.  I personally reviewed loop recorder transmissions, no evidence of atrial fibrillation, patient does have PVCs which are asymptomatic.  ALLERGIES: No Known Allergies   MEDICATION LIST PRIOR TO VISIT: Current Outpatient Medications on File Prior to Visit  Medication Sig Dispense Refill   acetaminophen (TYLENOL) 500 MG tablet Take 500 mg by mouth every 6 (six) hours as needed.     amLODipine (NORVASC) 2.5 MG tablet Take 1 tablet (2.5 mg total) by mouth daily. 90 tablet 3   CALCIUM CARB-CHOLECALCIFEROL PO Take by mouth.     carboxymethylcellulose (REFRESH PLUS) 0.5 % SOLN Place 1 drop into both eyes 2 (two) times daily as needed (dry eyes).     cephALEXin (KEFLEX) 250 MG capsule Take by mouth 4 (four) times daily.     cetirizine (ZYRTEC) 10 MG tablet Take 10 mg by mouth daily.     Cholecalciferol (VITAMIN D3) 50 MCG  (2000 UT) CHEW Chew by mouth.     cycloSPORINE (RESTASIS) 0.05 % ophthalmic emulsion 1 drop 2 (two) times daily.     fluticasone (FLONASE) 50 MCG/ACT nasal spray Place into both nostrils daily.     hydrALAZINE (APRESOLINE) 100 MG tablet TAKE 1 TABLET(100 MG) BY MOUTH THREE TIMES DAILY 90 tablet 5   memantine (NAMENDA) 10 MG tablet Take 10 mg by mouth 2 (two) times daily.     Menthol, Topical Analgesic, (BIOFREEZE ROLL-ON) 4 % GEL Apply topically.     Multiple Vitamin (MULTIVITAMIN WITH MINERALS) TABS tablet Take 1 tablet by mouth daily.     olmesartan (BENICAR) 40 MG tablet TAKE 1 TABLET(40 MG) BY MOUTH EVERY EVENING 90 tablet 1   Omega-3 Fatty Acids (SALMON OIL-1000 PO) Take by mouth daily.     Turmeric POWD      No current facility-administered medications on file prior to visit.    PAST MEDICAL HISTORY: Past Medical History:  Diagnosis Date   Dehydration    Dizziness    Elevated serum creatinine    Encounter for loop recorder check 06/27/2019   Glaucoma    Hyperlipidemia    Hypertension    Loop recorder [employer] 05/21/19 06/08/2019   Right bundle branch block    Seasonal affective disorder (HCC)    Syncope and collapse 10/23/2018    PAST SURGICAL HISTORY: Past Surgical History:  Procedure Laterality Date   ABDOMINAL HYSTERECTOMY     TAH-BSO   CATARACT EXTRACTION Bilateral    COLONOSCOPY WITH PROPOFOL  N/A 02/06/2015   Procedure: COLONOSCOPY WITH PROPOFOL;  Surgeon: Garlan Fair, MD;  Location: [facility];  Service: Endoscopy;  Laterality: N/A;   LOOP RECORDER IMPLANT  05/2019   tooth extraction  2020    FAMILY HISTORY: The patient family history includes Cancer in her brother and brother; Heart disease in her brother; Hypertension in her father, mother, and sister; Stroke in her sister.   SOCIAL HISTORY:  The patient  reports that she has never smoked. She has never used smokeless tobacco. She reports that she does not drink alcohol and does not use drugs.  Review  of Systems  Constitutional: Negative for chills and fever.  HENT:  Negative for hoarse voice and nosebleeds.   Eyes:  Negative for discharge, double vision and pain.  Cardiovascular:  Negative for chest pain, claudication, dyspnea on exertion, leg swelling, near-syncope, orthopnea, palpitations, paroxysmal nocturnal dyspnea and syncope (no recurrence).  Respiratory:  Negative for hemoptysis and shortness of breath.   Musculoskeletal:  Negative for muscle cramps and myalgias.  Gastrointestinal:  Negative for abdominal pain, constipation, diarrhea, hematemesis, hematochezia, melena, nausea and vomiting.  Neurological:  Negative for dizziness and light-headedness.    PHYSICAL EXAM:    09/19/2021   10:07 AM 03/30/2021    9:42 AM 02/06/2021    9:39 AM  Vitals with BMI  Height 5' 4" 5' 4" 5' 4"  Weight 146 lbs 149 lbs 146 lbs  BMI 25.05 78.58 85.02  Systolic 774 128 786  Diastolic 70 72 63  Pulse 78 77 74   Orthostatic VS for the past 72 hrs (Last 3 readings):  Patient Position BP Location Cuff Size  09/19/21 1007 Sitting Left Arm Normal    CONSTITUTIONAL: Well-developed and well-nourished. No acute distress.  SKIN: Skin is warm and dry. No rash noted. No cyanosis. No pallor. No jaundice HEAD: Normocephalic and atraumatic.  EYES: No scleral icterus MOUTH/THROAT: Moist oral membranes.  NECK: No JVD present. No thyromegaly noted. No carotid bruits  LYMPHATIC: No visible cervical adenopathy.  CHEST Normal respiratory effort. No intercostal retractions  LUNGS: Clear to auscultation bilaterally.  No stridor. No wheezes. No rales.  CARDIOVASCULAR: Regular rate and rhythm, positive S1-S2, no murmurs rubs or gallops appreciated.  The loop recorder site is healing well no signs of infection, site is clean dry and intact. ABDOMINAL: No apparent ascites.  EXTREMITIES: No peripheral edema  HEMATOLOGIC: No significant bruising NEUROLOGIC: Oriented to person, place, and time. Nonfocal. Normal  muscle tone.  PSYCHIATRIC: Normal mood and affect. Normal behavior. Cooperative Physical exam unchanged compared to previous office visit.  CARDIAC DATABASE: EKG  11/21/2020: Ectopic atrial rhythm, 66 bpm, right bundle branch block, without underlying injury pattern. 02/06/2021: Ectopic atrial rhythm at a rate of 72 bpm.  Right bundle branch block.  Compared to EKG 11/21/2020, no significant change. 09/19/2021: Sinus rhythm at a rate of 74 bpm.  Right bundle branch block.  Normal axis.  Lexiscan myoview stress test 02/11/2018: 1. Lexiscan stress test with low level exercise was performed. Patient reached 93% of the maximum predicted heart rate. No stress symptoms reported. Blood pressure was normal. Stress electrocardiogram showed sinus tachycardia, RBBB, no stress arrhythmias and normal stress repolarization. 2. The overall quality of the study is good. There is no evidence of abnormal lung activity. Stress and rest SPECT images demonstrate homogeneous tracer distribution throughout the myocardium. Gated SPECT imaging reveals normal myocardial thickening and wall motion. The left ventricular ejection fraction was normal (67%). 3. Low risk study.  Echo at [facility], [place]-01/05/2018- normal left ventricle systolic function, WS-56-81 percent. No regional wall motion abnormalities. Normal diastolic function. Trivial TR, no pulmonary hypertension.  Loop Recorder: Remote loop recorder transmission 10/14/2020: Predominant rhythm is normal sinus rhythm.  No significant arrhythmias, no atrial fibrillation, no pauses.  No symptoms reported.  Remote loop recorder transmission 01/16/2021:  Predominant rhythm is normal sinus rhythm. Occasional PACs and PVCs and artifact. No symptoms reported. No atrial fibrillation or heart block.  Remote loop recorder transmission 03/19/2021: Predominant rhythm is normal sinus rhythm. False atrial fibrillation. 2 patient transmissions reveal normal  sinus rhythm. No atrial fibrillation, no heart block.  Remote loop recorder transmission 08/21/2021:  Predominant rhythm is normal sinus rhythm. False AF, PACs and PVCs. No symptoms reported.  LABORATORY DATA:     No data to display             Latest Ref Rng & Units 02/05/2019    1:34 PM  CMP  Glucose 65 - 99 mg/dL 89   BUN 8 - 27 mg/dL 17   Creatinine 0.57 - 1.00 mg/dL 0.91   Sodium 134 - 144 mmol/L 139   Potassium 3.5 - 5.2 mmol/L 4.3   Chloride 96 - 106 mmol/L 99   CO2 20 - 29 mmol/L 22   Calcium 8.7 - 10.3 mg/dL 9.7    Lipid Panel  05/28/2018-cholesterol-208, HDL-79, LDL-117, triglycerides-64.  External Labs: 06/06/2020: BUN 20, creatinine 0.90, GFR >60, sodium 136, potassium 4.3 A1c 5.5%  Collected: 01/10/2020 Creatinine 0.85 mg/dL. eGFR: 77 mL/min per 1.73 m Hemoglobin 14.2 g/dL, hematocrit 42.1% Lipid profile: Total cholesterol 200, triglycerides 64, HDL 74, LDL 114, non-HDL 126 Hemoglobin A1c: 5.70 TSH: 0.83  FINAL MEDICATION LIST END OF ENCOUNTER: No orders of the defined types were placed in this encounter.     Medications Discontinued During This Encounter  Medication Reason   calcium carbonate (OSCAL) 1500 (600 Ca) MG TABS tablet    loratadine (CLARITIN) 10 MG tablet       Current Outpatient Medications:    acetaminophen (TYLENOL) 500 MG tablet, Take 500 mg by mouth every 6 (six) hours as needed., Disp: , Rfl:    amLODipine (NORVASC) 2.5 MG tablet, Take 1 tablet (2.5 mg total) by mouth daily., Disp: 90 tablet, Rfl: 3   CALCIUM CARB-CHOLECALCIFEROL PO, Take by mouth., Disp: , Rfl:    carboxymethylcellulose (REFRESH PLUS) 0.5 % SOLN, Place 1 drop into both eyes 2 (two) times daily as needed (dry eyes)., Disp: , Rfl:    cephALEXin (KEFLEX) 250 MG capsule, Take by mouth 4 (four) times daily., Disp: , Rfl:    cetirizine (ZYRTEC) 10 MG tablet, Take 10 mg by mouth daily., Disp: , Rfl:    Cholecalciferol (VITAMIN D3) 50 MCG (2000 UT) CHEW, Chew by  mouth., Disp: , Rfl:    cycloSPORINE (RESTASIS) 0.05 % ophthalmic emulsion, 1 drop 2 (two) times daily., Disp: , Rfl:    fluticasone (FLONASE) 50 MCG/ACT nasal spray, Place into both nostrils daily., Disp: , Rfl:    hydrALAZINE (APRESOLINE) 100 MG tablet, TAKE 1 TABLET(100 MG) BY MOUTH THREE TIMES DAILY, Disp: 90 tablet, Rfl: 5   memantine (NAMENDA) 10 MG tablet, Take 10 mg by mouth 2 (two) times daily., Disp: , Rfl:    Menthol, Topical Analgesic, (BIOFREEZE ROLL-ON) 4 % GEL, Apply topically., Disp: , Rfl:    Multiple Vitamin (MULTIVITAMIN WITH MINERALS) TABS tablet, Take 1 tablet by mouth daily., Disp: , Rfl:    olmesartan (  BENICAR) 40 MG tablet, TAKE 1 TABLET(40 MG) BY MOUTH EVERY EVENING, Disp: 90 tablet, Rfl: 1   Omega-3 Fatty Acids (SALMON OIL-1000 PO), Take by mouth daily., Disp: , Rfl:    Turmeric POWD, , Disp: , Rfl:   IMPRESSION:    ICD-10-CM   1. Essential hypertension  I10 EKG 12-Lead    2. History of syncope  Z87.898     3. Status post placement of implantable loop recorder  Z95.818       RECOMMENDATIONS: SCHYLER BUTIKOFER is a 88 y.o. female whose past medical history and cardiac risk factors include: Hypertension, hyperlipidemia, history of syncope, postmenopausal female, advanced age.  History of syncope No recurrence  I personally reviewed loop recorder transmissions.  No evidence of significant cardiac arrhythmias.  Patient does have PVCs which are asymptomatic. Will continue to monitor.  Essential hypertension Patient's blood pressure is elevated in the office today, however it is under excellent control on home monitoring.  We will continue present medications. Continue to monitor   Orders Placed This Encounter  Procedures   EKG 12-Lead   --Continue cardiac medications as reconciled in final medication list. --Return in about 1 year (around 09/20/2022) for HTN, loop recorder . Or sooner if needed. --Continue follow-up with your primary care physician regarding the  management of your other chronic comorbid conditions.  Patient's questions and concerns were addressed to her satisfaction. She voices understanding of the instructions provided during this encounter.   This note was created using a voice recognition software as a result there may be grammatical errors inadvertently enclosed that do not reflect the nature of this encounter. Every attempt is made to correct such errors.   April Berthold, PA-C 09/19/2021, 11:11 AM Office: [phone]

## 2021-09-20 DIAGNOSIS — M25652 Stiffness of left hip, not elsewhere classified: Secondary | ICD-10-CM | POA: Diagnosis not present

## 2021-09-20 DIAGNOSIS — R2681 Unsteadiness on feet: Secondary | ICD-10-CM | POA: Diagnosis not present

## 2021-09-20 DIAGNOSIS — M62552 Muscle wasting and atrophy, not elsewhere classified, left thigh: Secondary | ICD-10-CM | POA: Diagnosis not present

## 2021-09-20 DIAGNOSIS — M62551 Muscle wasting and atrophy, not elsewhere classified, right thigh: Secondary | ICD-10-CM | POA: Diagnosis not present

## 2021-09-20 DIAGNOSIS — R269 Unspecified abnormalities of gait and mobility: Secondary | ICD-10-CM | POA: Diagnosis not present

## 2021-09-20 DIAGNOSIS — M545 Low back pain, unspecified: Secondary | ICD-10-CM | POA: Diagnosis not present

## 2021-09-21 DIAGNOSIS — Z95818 Presence of other cardiac implants and grafts: Secondary | ICD-10-CM | POA: Diagnosis not present

## 2021-09-21 DIAGNOSIS — R55 Syncope and collapse: Secondary | ICD-10-CM | POA: Diagnosis not present

## 2021-09-21 DIAGNOSIS — Z4509 Encounter for adjustment and management of other cardiac device: Secondary | ICD-10-CM | POA: Diagnosis not present

## 2021-09-21 DIAGNOSIS — I10 Essential (primary) hypertension: Secondary | ICD-10-CM | POA: Diagnosis not present

## 2021-09-25 DIAGNOSIS — H04123 Dry eye syndrome of bilateral lacrimal glands: Secondary | ICD-10-CM | POA: Diagnosis not present

## 2021-09-25 DIAGNOSIS — Z961 Presence of intraocular lens: Secondary | ICD-10-CM | POA: Diagnosis not present

## 2021-09-25 DIAGNOSIS — H40023 Open angle with borderline findings, high risk, bilateral: Secondary | ICD-10-CM | POA: Diagnosis not present

## 2021-09-25 DIAGNOSIS — D3141 Benign neoplasm of right ciliary body: Secondary | ICD-10-CM | POA: Diagnosis not present

## 2021-09-26 DIAGNOSIS — R269 Unspecified abnormalities of gait and mobility: Secondary | ICD-10-CM | POA: Diagnosis not present

## 2021-09-26 DIAGNOSIS — M62552 Muscle wasting and atrophy, not elsewhere classified, left thigh: Secondary | ICD-10-CM | POA: Diagnosis not present

## 2021-09-26 DIAGNOSIS — R2681 Unsteadiness on feet: Secondary | ICD-10-CM | POA: Diagnosis not present

## 2021-09-26 DIAGNOSIS — M62551 Muscle wasting and atrophy, not elsewhere classified, right thigh: Secondary | ICD-10-CM | POA: Diagnosis not present

## 2021-09-26 DIAGNOSIS — M25652 Stiffness of left hip, not elsewhere classified: Secondary | ICD-10-CM | POA: Diagnosis not present

## 2021-09-26 DIAGNOSIS — M545 Low back pain, unspecified: Secondary | ICD-10-CM | POA: Diagnosis not present

## 2021-09-28 ENCOUNTER — Ambulatory Visit: Payer: Medicare Other | Admitting: Student

## 2021-09-28 DIAGNOSIS — R269 Unspecified abnormalities of gait and mobility: Secondary | ICD-10-CM | POA: Diagnosis not present

## 2021-09-28 DIAGNOSIS — R2681 Unsteadiness on feet: Secondary | ICD-10-CM | POA: Diagnosis not present

## 2021-09-28 DIAGNOSIS — M25652 Stiffness of left hip, not elsewhere classified: Secondary | ICD-10-CM | POA: Diagnosis not present

## 2021-09-28 DIAGNOSIS — M62552 Muscle wasting and atrophy, not elsewhere classified, left thigh: Secondary | ICD-10-CM | POA: Diagnosis not present

## 2021-09-28 DIAGNOSIS — M62551 Muscle wasting and atrophy, not elsewhere classified, right thigh: Secondary | ICD-10-CM | POA: Diagnosis not present

## 2021-09-28 DIAGNOSIS — M545 Low back pain, unspecified: Secondary | ICD-10-CM | POA: Diagnosis not present

## 2021-10-01 DIAGNOSIS — J029 Acute pharyngitis, unspecified: Secondary | ICD-10-CM | POA: Diagnosis not present

## 2021-10-01 DIAGNOSIS — R350 Frequency of micturition: Secondary | ICD-10-CM | POA: Diagnosis not present

## 2021-10-02 DIAGNOSIS — M25652 Stiffness of left hip, not elsewhere classified: Secondary | ICD-10-CM | POA: Diagnosis not present

## 2021-10-02 DIAGNOSIS — M62552 Muscle wasting and atrophy, not elsewhere classified, left thigh: Secondary | ICD-10-CM | POA: Diagnosis not present

## 2021-10-02 DIAGNOSIS — M62551 Muscle wasting and atrophy, not elsewhere classified, right thigh: Secondary | ICD-10-CM | POA: Diagnosis not present

## 2021-10-02 DIAGNOSIS — R269 Unspecified abnormalities of gait and mobility: Secondary | ICD-10-CM | POA: Diagnosis not present

## 2021-10-02 DIAGNOSIS — R2681 Unsteadiness on feet: Secondary | ICD-10-CM | POA: Diagnosis not present

## 2021-10-02 DIAGNOSIS — M545 Low back pain, unspecified: Secondary | ICD-10-CM | POA: Diagnosis not present

## 2021-10-04 DIAGNOSIS — M62552 Muscle wasting and atrophy, not elsewhere classified, left thigh: Secondary | ICD-10-CM | POA: Diagnosis not present

## 2021-10-04 DIAGNOSIS — M545 Low back pain, unspecified: Secondary | ICD-10-CM | POA: Diagnosis not present

## 2021-10-04 DIAGNOSIS — R2681 Unsteadiness on feet: Secondary | ICD-10-CM | POA: Diagnosis not present

## 2021-10-04 DIAGNOSIS — M62551 Muscle wasting and atrophy, not elsewhere classified, right thigh: Secondary | ICD-10-CM | POA: Diagnosis not present

## 2021-10-04 DIAGNOSIS — M25652 Stiffness of left hip, not elsewhere classified: Secondary | ICD-10-CM | POA: Diagnosis not present

## 2021-10-04 DIAGNOSIS — R269 Unspecified abnormalities of gait and mobility: Secondary | ICD-10-CM | POA: Diagnosis not present

## 2021-10-09 DIAGNOSIS — M62551 Muscle wasting and atrophy, not elsewhere classified, right thigh: Secondary | ICD-10-CM | POA: Diagnosis not present

## 2021-10-09 DIAGNOSIS — M25652 Stiffness of left hip, not elsewhere classified: Secondary | ICD-10-CM | POA: Diagnosis not present

## 2021-10-09 DIAGNOSIS — M62552 Muscle wasting and atrophy, not elsewhere classified, left thigh: Secondary | ICD-10-CM | POA: Diagnosis not present

## 2021-10-09 DIAGNOSIS — R2681 Unsteadiness on feet: Secondary | ICD-10-CM | POA: Diagnosis not present

## 2021-10-09 DIAGNOSIS — M545 Low back pain, unspecified: Secondary | ICD-10-CM | POA: Diagnosis not present

## 2021-10-09 DIAGNOSIS — R269 Unspecified abnormalities of gait and mobility: Secondary | ICD-10-CM | POA: Diagnosis not present

## 2021-10-11 DIAGNOSIS — M25652 Stiffness of left hip, not elsewhere classified: Secondary | ICD-10-CM | POA: Diagnosis not present

## 2021-10-11 DIAGNOSIS — M62551 Muscle wasting and atrophy, not elsewhere classified, right thigh: Secondary | ICD-10-CM | POA: Diagnosis not present

## 2021-10-11 DIAGNOSIS — R269 Unspecified abnormalities of gait and mobility: Secondary | ICD-10-CM | POA: Diagnosis not present

## 2021-10-11 DIAGNOSIS — M62552 Muscle wasting and atrophy, not elsewhere classified, left thigh: Secondary | ICD-10-CM | POA: Diagnosis not present

## 2021-10-11 DIAGNOSIS — M545 Low back pain, unspecified: Secondary | ICD-10-CM | POA: Diagnosis not present

## 2021-10-11 DIAGNOSIS — R2681 Unsteadiness on feet: Secondary | ICD-10-CM | POA: Diagnosis not present

## 2021-10-12 DIAGNOSIS — R5383 Other fatigue: Secondary | ICD-10-CM | POA: Diagnosis not present

## 2021-10-12 DIAGNOSIS — M1711 Unilateral primary osteoarthritis, right knee: Secondary | ICD-10-CM | POA: Diagnosis not present

## 2021-10-12 DIAGNOSIS — M542 Cervicalgia: Secondary | ICD-10-CM | POA: Diagnosis not present

## 2021-10-12 DIAGNOSIS — R7303 Prediabetes: Secondary | ICD-10-CM | POA: Diagnosis not present

## 2021-10-12 DIAGNOSIS — I10 Essential (primary) hypertension: Secondary | ICD-10-CM | POA: Diagnosis not present

## 2021-10-16 DIAGNOSIS — R269 Unspecified abnormalities of gait and mobility: Secondary | ICD-10-CM | POA: Diagnosis not present

## 2021-10-16 DIAGNOSIS — M25652 Stiffness of left hip, not elsewhere classified: Secondary | ICD-10-CM | POA: Diagnosis not present

## 2021-10-16 DIAGNOSIS — M545 Low back pain, unspecified: Secondary | ICD-10-CM | POA: Diagnosis not present

## 2021-10-16 DIAGNOSIS — R2681 Unsteadiness on feet: Secondary | ICD-10-CM | POA: Diagnosis not present

## 2021-10-16 DIAGNOSIS — M62551 Muscle wasting and atrophy, not elsewhere classified, right thigh: Secondary | ICD-10-CM | POA: Diagnosis not present

## 2021-10-16 DIAGNOSIS — M62552 Muscle wasting and atrophy, not elsewhere classified, left thigh: Secondary | ICD-10-CM | POA: Diagnosis not present

## 2021-10-22 DIAGNOSIS — Z4509 Encounter for adjustment and management of other cardiac device: Secondary | ICD-10-CM | POA: Diagnosis not present

## 2021-10-22 DIAGNOSIS — R55 Syncope and collapse: Secondary | ICD-10-CM | POA: Diagnosis not present

## 2021-10-22 DIAGNOSIS — Z95818 Presence of other cardiac implants and grafts: Secondary | ICD-10-CM | POA: Diagnosis not present

## 2021-10-22 DIAGNOSIS — I10 Essential (primary) hypertension: Secondary | ICD-10-CM | POA: Diagnosis not present

## 2021-10-23 DIAGNOSIS — M62552 Muscle wasting and atrophy, not elsewhere classified, left thigh: Secondary | ICD-10-CM | POA: Diagnosis not present

## 2021-10-23 DIAGNOSIS — R269 Unspecified abnormalities of gait and mobility: Secondary | ICD-10-CM | POA: Diagnosis not present

## 2021-10-23 DIAGNOSIS — M62551 Muscle wasting and atrophy, not elsewhere classified, right thigh: Secondary | ICD-10-CM | POA: Diagnosis not present

## 2021-10-23 DIAGNOSIS — R2681 Unsteadiness on feet: Secondary | ICD-10-CM | POA: Diagnosis not present

## 2021-10-23 DIAGNOSIS — M25652 Stiffness of left hip, not elsewhere classified: Secondary | ICD-10-CM | POA: Diagnosis not present

## 2021-10-23 DIAGNOSIS — M545 Low back pain, unspecified: Secondary | ICD-10-CM | POA: Diagnosis not present

## 2021-11-13 ENCOUNTER — Other Ambulatory Visit: Payer: Self-pay | Admitting: Cardiology

## 2021-11-13 DIAGNOSIS — I10 Essential (primary) hypertension: Secondary | ICD-10-CM

## 2021-11-15 ENCOUNTER — Telehealth: Payer: Self-pay | Admitting: Cardiology

## 2021-11-15 NOTE — Telephone Encounter (Signed)
Patient requesting refill for olmesartan 40 MG, 90 day supply to be sent to [facility] on [address]. Please call to tell her when this has been done.

## 2021-11-16 ENCOUNTER — Other Ambulatory Visit: Payer: Self-pay

## 2021-11-16 DIAGNOSIS — I10 Essential (primary) hypertension: Secondary | ICD-10-CM

## 2021-11-16 MED ORDER — OLMESARTAN MEDOXOMIL 40 MG PO TABS: ORAL_TABLET | ORAL | 1 refills | Status: DC

## 2021-11-16 NOTE — Telephone Encounter (Signed)
Medication has been refilled and patient is aware

## 2021-11-22 DIAGNOSIS — Z95818 Presence of other cardiac implants and grafts: Secondary | ICD-10-CM | POA: Diagnosis not present

## 2021-11-22 DIAGNOSIS — R55 Syncope and collapse: Secondary | ICD-10-CM | POA: Diagnosis not present

## 2021-11-22 DIAGNOSIS — Z4509 Encounter for adjustment and management of other cardiac device: Secondary | ICD-10-CM | POA: Diagnosis not present

## 2021-11-26 ENCOUNTER — Telehealth: Payer: Self-pay

## 2021-11-26 NOTE — Telephone Encounter (Signed)
Per conversation with Elmo Putt, Dr. Terri Skains has approved for patient to graduate from [place] patient to inform her and explain for her to return the device. Patient and her husband understand and will return the device in office.    September BP Data: Average Systolic BP Level 824.23 mmHg Lowest Systolic BP Level 536 mmHg Highest Systolic BP Level 144 mmHg

## 2021-12-23 DIAGNOSIS — Z4509 Encounter for adjustment and management of other cardiac device: Secondary | ICD-10-CM | POA: Diagnosis not present

## 2021-12-23 DIAGNOSIS — R55 Syncope and collapse: Secondary | ICD-10-CM | POA: Diagnosis not present

## 2021-12-23 DIAGNOSIS — Z95818 Presence of other cardiac implants and grafts: Secondary | ICD-10-CM | POA: Diagnosis not present

## 2022-01-17 DIAGNOSIS — M1711 Unilateral primary osteoarthritis, right knee: Secondary | ICD-10-CM | POA: Diagnosis not present

## 2022-01-23 DIAGNOSIS — R55 Syncope and collapse: Secondary | ICD-10-CM | POA: Diagnosis not present

## 2022-01-23 DIAGNOSIS — Z4509 Encounter for adjustment and management of other cardiac device: Secondary | ICD-10-CM | POA: Diagnosis not present

## 2022-01-23 DIAGNOSIS — Z95818 Presence of other cardiac implants and grafts: Secondary | ICD-10-CM | POA: Diagnosis not present

## 2022-01-24 DIAGNOSIS — N3020 Other chronic cystitis without hematuria: Secondary | ICD-10-CM | POA: Diagnosis not present

## 2022-01-24 DIAGNOSIS — N952 Postmenopausal atrophic vaginitis: Secondary | ICD-10-CM | POA: Diagnosis not present

## 2022-02-12 DIAGNOSIS — I10 Essential (primary) hypertension: Secondary | ICD-10-CM | POA: Diagnosis not present

## 2022-02-12 DIAGNOSIS — M1712 Unilateral primary osteoarthritis, left knee: Secondary | ICD-10-CM | POA: Diagnosis not present

## 2022-02-12 DIAGNOSIS — M1711 Unilateral primary osteoarthritis, right knee: Secondary | ICD-10-CM | POA: Diagnosis not present

## 2022-02-12 DIAGNOSIS — Z6824 Body mass index (BMI) 24.0-24.9, adult: Secondary | ICD-10-CM | POA: Diagnosis not present

## 2022-02-22 DIAGNOSIS — J029 Acute pharyngitis, unspecified: Secondary | ICD-10-CM | POA: Diagnosis not present

## 2022-02-22 DIAGNOSIS — R059 Cough, unspecified: Secondary | ICD-10-CM | POA: Diagnosis not present

## 2022-02-23 DIAGNOSIS — R55 Syncope and collapse: Secondary | ICD-10-CM | POA: Diagnosis not present

## 2022-02-23 DIAGNOSIS — Z4509 Encounter for adjustment and management of other cardiac device: Secondary | ICD-10-CM | POA: Diagnosis not present

## 2022-02-23 DIAGNOSIS — Z95818 Presence of other cardiac implants and grafts: Secondary | ICD-10-CM | POA: Diagnosis not present

## 2022-02-28 DIAGNOSIS — M25552 Pain in left hip: Secondary | ICD-10-CM | POA: Diagnosis not present

## 2022-02-28 DIAGNOSIS — Z6825 Body mass index (BMI) 25.0-25.9, adult: Secondary | ICD-10-CM | POA: Diagnosis not present

## 2022-02-28 DIAGNOSIS — M1711 Unilateral primary osteoarthritis, right knee: Secondary | ICD-10-CM | POA: Diagnosis not present

## 2022-02-28 DIAGNOSIS — R0989 Other specified symptoms and signs involving the circulatory and respiratory systems: Secondary | ICD-10-CM | POA: Diagnosis not present

## 2022-02-28 DIAGNOSIS — M1712 Unilateral primary osteoarthritis, left knee: Secondary | ICD-10-CM | POA: Diagnosis not present

## 2022-03-26 DIAGNOSIS — Z95818 Presence of other cardiac implants and grafts: Secondary | ICD-10-CM | POA: Diagnosis not present

## 2022-03-26 DIAGNOSIS — Z4509 Encounter for adjustment and management of other cardiac device: Secondary | ICD-10-CM | POA: Diagnosis not present

## 2022-03-26 DIAGNOSIS — R55 Syncope and collapse: Secondary | ICD-10-CM | POA: Diagnosis not present

## 2022-03-29 DIAGNOSIS — H04123 Dry eye syndrome of bilateral lacrimal glands: Secondary | ICD-10-CM | POA: Diagnosis not present

## 2022-03-29 DIAGNOSIS — H57813 Brow ptosis, bilateral: Secondary | ICD-10-CM | POA: Diagnosis not present

## 2022-03-29 DIAGNOSIS — H40023 Open angle with borderline findings, high risk, bilateral: Secondary | ICD-10-CM | POA: Diagnosis not present

## 2022-04-02 ENCOUNTER — Other Ambulatory Visit: Payer: Self-pay | Admitting: Family Medicine

## 2022-04-02 DIAGNOSIS — Z1231 Encounter for screening mammogram for malignant neoplasm of breast: Secondary | ICD-10-CM

## 2022-04-26 DIAGNOSIS — Z95818 Presence of other cardiac implants and grafts: Secondary | ICD-10-CM | POA: Diagnosis not present

## 2022-04-26 DIAGNOSIS — Z4509 Encounter for adjustment and management of other cardiac device: Secondary | ICD-10-CM | POA: Diagnosis not present

## 2022-04-26 DIAGNOSIS — R55 Syncope and collapse: Secondary | ICD-10-CM | POA: Diagnosis not present

## 2022-05-10 ENCOUNTER — Other Ambulatory Visit: Payer: Self-pay | Admitting: Cardiology

## 2022-05-10 DIAGNOSIS — I10 Essential (primary) hypertension: Secondary | ICD-10-CM

## 2022-05-21 ENCOUNTER — Ambulatory Visit
Admission: RE | Admit: 2022-05-21 | Discharge: 2022-05-21 | Disposition: A | Discharge status: Home / Self Care | Payer: Medicare Other | Source: Ambulatory Visit | Attending: Family Medicine | Admitting: Family Medicine

## 2022-05-21 DIAGNOSIS — Z1231 Encounter for screening mammogram for malignant neoplasm of breast: Secondary | ICD-10-CM

## 2022-05-28 ENCOUNTER — Telehealth: Payer: Self-pay

## 2022-05-28 DIAGNOSIS — Z4509 Encounter for adjustment and management of other cardiac device: Secondary | ICD-10-CM | POA: Diagnosis not present

## 2022-05-28 DIAGNOSIS — Z95818 Presence of other cardiac implants and grafts: Secondary | ICD-10-CM | POA: Diagnosis not present

## 2022-05-28 DIAGNOSIS — R55 Syncope and collapse: Secondary | ICD-10-CM | POA: Diagnosis not present

## 2022-05-28 NOTE — Telephone Encounter (Signed)
Overdue Remote transmission: 27 days [employer]  Called and spoke with patient regarding her transmissions overdue and transmitter connectivity status is disconnected.   Patient had unplugged machine and was getting ready to return it to our office, because she has to pay transmission fees and she says also, that  [facility] gave her a note on behalf of Dr. Terri Skains, that she no longer needs to "process transmissions" and  to return the transmitter and that she no longer needs to send transmissions. I do not see in her chart any documentation on that. Please advise.   Also [place], do I need her to come in for a check? Please advise.

## 2022-05-28 NOTE — Telephone Encounter (Signed)
She can discontinue monthly transmissions and can return the transmitter to [employer]. Lets leave the device in place and if she ever has a syncope, we can do manual check. No need to come in now

## 2022-05-30 NOTE — Telephone Encounter (Signed)
Called patient, NA, LMAM

## 2022-05-31 NOTE — Telephone Encounter (Signed)
Patient called back, she will be turning her transmitter in to the clinic and I will give it to [facility] rep.

## 2022-06-12 ENCOUNTER — Other Ambulatory Visit: Payer: Self-pay | Admitting: Cardiology

## 2022-06-17 DIAGNOSIS — J302 Other seasonal allergic rhinitis: Secondary | ICD-10-CM | POA: Diagnosis not present

## 2022-06-17 DIAGNOSIS — E782 Mixed hyperlipidemia: Secondary | ICD-10-CM | POA: Diagnosis not present

## 2022-06-17 DIAGNOSIS — I10 Essential (primary) hypertension: Secondary | ICD-10-CM | POA: Diagnosis not present

## 2022-06-17 DIAGNOSIS — Z0000 Encounter for general adult medical examination without abnormal findings: Secondary | ICD-10-CM | POA: Diagnosis not present

## 2022-06-17 DIAGNOSIS — M791 Myalgia, unspecified site: Secondary | ICD-10-CM | POA: Diagnosis not present

## 2022-06-17 DIAGNOSIS — Z6825 Body mass index (BMI) 25.0-25.9, adult: Secondary | ICD-10-CM | POA: Diagnosis not present

## 2022-06-17 DIAGNOSIS — R7303 Prediabetes: Secondary | ICD-10-CM | POA: Diagnosis not present

## 2022-06-17 DIAGNOSIS — R413 Other amnesia: Secondary | ICD-10-CM | POA: Diagnosis not present

## 2022-06-17 DIAGNOSIS — I4510 Unspecified right bundle-branch block: Secondary | ICD-10-CM | POA: Diagnosis not present

## 2022-06-18 DIAGNOSIS — E782 Mixed hyperlipidemia: Secondary | ICD-10-CM | POA: Diagnosis not present

## 2022-06-18 DIAGNOSIS — I4510 Unspecified right bundle-branch block: Secondary | ICD-10-CM | POA: Diagnosis not present

## 2022-06-18 DIAGNOSIS — R7303 Prediabetes: Secondary | ICD-10-CM | POA: Diagnosis not present

## 2022-06-18 DIAGNOSIS — Z0000 Encounter for general adult medical examination without abnormal findings: Secondary | ICD-10-CM | POA: Diagnosis not present

## 2022-06-18 DIAGNOSIS — M791 Myalgia, unspecified site: Secondary | ICD-10-CM | POA: Diagnosis not present

## 2022-06-18 DIAGNOSIS — I10 Essential (primary) hypertension: Secondary | ICD-10-CM | POA: Diagnosis not present

## 2022-07-07 ENCOUNTER — Telehealth: Payer: Self-pay | Admitting: Cardiology

## 2022-07-07 NOTE — Telephone Encounter (Signed)
Patient with syncope, essentially maintaining normal sinus rhythm with no atrial fibrillation or heart block since loop implantation in March 2021.  Will discontinue further monitoring.

## 2022-09-20 ENCOUNTER — Ambulatory Visit: Payer: Medicare Other | Admitting: Cardiology

## 2022-10-03 DIAGNOSIS — H35361 Drusen (degenerative) of macula, right eye: Secondary | ICD-10-CM | POA: Diagnosis not present

## 2022-10-03 DIAGNOSIS — H40023 Open angle with borderline findings, high risk, bilateral: Secondary | ICD-10-CM | POA: Diagnosis not present

## 2022-10-03 DIAGNOSIS — H57813 Brow ptosis, bilateral: Secondary | ICD-10-CM | POA: Diagnosis not present

## 2022-10-03 DIAGNOSIS — H04123 Dry eye syndrome of bilateral lacrimal glands: Secondary | ICD-10-CM | POA: Diagnosis not present

## 2022-10-04 ENCOUNTER — Encounter: Payer: Self-pay | Admitting: Cardiology

## 2022-10-04 ENCOUNTER — Ambulatory Visit: Payer: Medicare Other | Admitting: Cardiology

## 2022-10-04 VITALS — BP 120/70 | HR 76 | Resp 16 | Ht 64.0 in | Wt 148.0 lb

## 2022-10-04 DIAGNOSIS — Z87898 Personal history of other specified conditions: Secondary | ICD-10-CM

## 2022-10-04 DIAGNOSIS — I10 Essential (primary) hypertension: Secondary | ICD-10-CM | POA: Diagnosis not present

## 2022-10-04 DIAGNOSIS — Z95818 Presence of other cardiac implants and grafts: Secondary | ICD-10-CM | POA: Diagnosis not present

## 2022-10-04 MED ORDER — HYDRALAZINE HCL 100 MG PO TABS
100.0000 mg | ORAL_TABLET | Freq: Two times a day (BID) | ORAL | 1 refills | Status: AC
Start: 2022-10-04 — End: ?

## 2022-10-04 NOTE — Progress Notes (Signed)
Date: 10/04/22 Last Office Visit: 11/21/2020  Chief Complaint  Patient presents with   Hypertension   Follow-up   HPI  April Douglas is a 89 y.o. female whose past medical history and cardiac risk factors include: Hypertension, hyperlipidemia, history of syncope, postmenopausal female, advanced age.  Patient is accompanied by her husband at today's office visit.  She presents today for her yearly follow up given her hx of syncope and hypertension.  Home blood pressure are well controlled - see media section.  However, she has been feeling tired and fatigue. Her husband would like to change her hydralazine from tid dosing to bid.  No chest pain or heart failure symptoms.   ALLERGIES: No Known Allergies   MEDICATION LIST PRIOR TO VISIT: Current Outpatient Medications on File Prior to Visit  Medication Sig Dispense Refill   acetaminophen (TYLENOL) 500 MG tablet Take 500 mg by mouth every 6 (six) hours as needed.     amLODipine (NORVASC) 2.5 MG tablet TAKE 1 TABLET(2.5 MG) BY MOUTH DAILY 90 tablet 3   CALCIUM CARB-CHOLECALCIFEROL PO Take by mouth.     carboxymethylcellulose (REFRESH PLUS) 0.5 % SOLN Place 1 drop into both eyes 2 (two) times daily as needed (dry eyes).     cetirizine (ZYRTEC) 10 MG tablet Take 10 mg by mouth daily.     Cholecalciferol (VITAMIN D3) 50 MCG (2000 UT) CHEW Chew by mouth.     cycloSPORINE (RESTASIS) 0.05 % ophthalmic emulsion 1 drop 2 (two) times daily.     fluticasone (FLONASE) 50 MCG/ACT nasal spray Place into both nostrils daily.     memantine (NAMENDA) 10 MG tablet Take 10 mg by mouth 2 (two) times daily.     Menthol, Topical Analgesic, (BIOFREEZE ROLL-ON) 4 % GEL Apply topically.     Multiple Vitamin (MULTIVITAMIN WITH MINERALS) TABS tablet Take 1 tablet by mouth daily.     olmesartan (BENICAR) 40 MG tablet TAKE 1 TABLET(40 MG) BY MOUTH EVERY EVENING 90 tablet 1   Omega-3 Fatty Acids (SALMON OIL-1000 PO) Take by mouth daily.     Turmeric POWD       No current facility-administered medications on file prior to visit.    PAST MEDICAL HISTORY: Past Medical History:  Diagnosis Date   Dehydration    Dizziness    Elevated serum creatinine    Encounter for loop recorder check 06/27/2019   Glaucoma    Hyperlipidemia    Hypertension    Loop recorder Biotronik 05/21/19 06/08/2019   Right bundle branch block    Seasonal affective disorder (HCC)    Syncope and collapse 10/23/2018    PAST SURGICAL HISTORY: Past Surgical History:  Procedure Laterality Date   ABDOMINAL HYSTERECTOMY     TAH-BSO   CATARACT EXTRACTION Bilateral    COLONOSCOPY WITH PROPOFOL N/A 02/06/2015   Procedure: COLONOSCOPY WITH PROPOFOL;  Surgeon: Charolett Bumpers, MD;  Location: [facility];  Service: Endoscopy;  Laterality: N/A;   LOOP RECORDER IMPLANT  05/2019   tooth extraction  2020    FAMILY HISTORY: The patient family history includes Cancer in her brother and brother; Heart disease in her brother; Hypertension in her father, mother, and sister; Stroke in her sister.   SOCIAL HISTORY:  The patient  reports that she has never smoked. She has never used smokeless tobacco. She reports that she does not drink alcohol and does not use drugs.  Review of Systems  Constitutional: Positive for malaise/fatigue.  Cardiovascular:  Negative for chest pain,  claudication, dyspnea on exertion, irregular heartbeat, leg swelling, near-syncope, orthopnea, palpitations, paroxysmal nocturnal dyspnea and syncope.  Respiratory:  Negative for shortness of breath.   Hematologic/Lymphatic: Negative for bleeding problem.  Musculoskeletal:  Negative for muscle cramps and myalgias.  Neurological:  Negative for dizziness and light-headedness.    PHYSICAL EXAM:    10/04/2022   11:06 AM 09/19/2021   10:07 AM 03/30/2021    9:42 AM  Vitals with BMI  Height 5\' 4"  5\' 4"  5\' 4"   Weight 148 lbs 146 lbs 149 lbs  BMI 25.39 25.05 25.56  Systolic 120 161 409  Diastolic 70 70 72  Pulse 76  78 77   Physical Exam  Constitutional: No distress.  Age appropriate, hemodynamically stable.   Neck: No JVD present.  Cardiovascular: Normal rate, regular rhythm, S1 normal, S2 normal, intact distal pulses and normal pulses. Exam reveals no gallop, no S3 and no S4.  No murmur heard. Pulmonary/Chest: Effort normal and breath sounds normal. No stridor. She has no wheezes. She has no rales.  The loop recorder site is healing well no signs of infection, site is clean dry and intact.  Abdominal: Soft. Bowel sounds are normal. She exhibits no distension. There is no abdominal tenderness.  Musculoskeletal:        General: No edema.     Cervical back: Neck supple.  Neurological: She is alert and oriented to person, place, and time. She has intact cranial nerves (2-12).  Skin: Skin is warm and moist.   CARDIAC DATABASE: EKG  10/04/2022: NSR, 72bpm, normal axis, RBBB, without underlying injury pattern.   Lexiscan myoview stress test 02/11/2018: 1. Lexiscan stress test with low level exercise was performed. Patient reached 93% of the maximum predicted heart rate. No stress symptoms reported. Blood pressure was normal. Stress electrocardiogram showed sinus tachycardia, RBBB, no stress arrhythmias and normal stress repolarization. 2. The overall quality of the study is good. There is no evidence of abnormal lung activity. Stress and rest SPECT images demonstrate homogeneous tracer distribution throughout the myocardium. Gated SPECT imaging reveals normal myocardial thickening and wall motion. The left ventricular ejection fraction was normal (67%). 3. Low risk study.   Echo at [facility] of [facility], [place]-01/05/2018- normal left ventricle systolic function, EF-60-65 percent. No regional wall motion abnormalities. Normal diastolic function. Trivial TR, no pulmonary hypertension.  Loop Recorder: Remote loop recorder transmission 10/14/2020: Predominant rhythm is normal sinus rhythm.  No  significant arrhythmias, no atrial fibrillation, no pauses.  No symptoms reported.  Remote loop recorder transmission 01/16/2021:  Predominant rhythm is normal sinus rhythm. Occasional PACs and PVCs and artifact. No symptoms reported. No atrial fibrillation or heart block.  Remote loop recorder transmission 03/19/2021: Predominant rhythm is normal sinus rhythm. False atrial fibrillation. 2 patient transmissions reveal normal sinus rhythm. No atrial fibrillation, no heart block.  Remote loop recorder transmission 08/21/2021:  Predominant rhythm is normal sinus rhythm. False AF, PACs and PVCs. No symptoms reported.  April 2024: Per patient request we will stop monitoring.  Patient with syncope, essentially maintaining normal sinus rhythm with no atrial fibrillation or heart block since loop implantation in March 2021.  LABORATORY DATA:     No data to display             Latest Ref Rng & Units 02/05/2019    1:34 PM  CMP  Glucose 65 - 99 mg/dL 89   BUN 8 - 27 mg/dL 17   Creatinine 8.11 - 1.00 mg/dL 9.14   Sodium  134 - 144 mmol/L 139   Potassium 3.5 - 5.2 mmol/L 4.3   Chloride 96 - 106 mmol/L 99   CO2 20 - 29 mmol/L 22   Calcium 8.7 - 10.3 mg/dL 9.7    Lipid Panel  1/61/[id number], HDL-79, LDL-117, triglycerides-64.  External Labs: 06/06/2020: BUN 20, creatinine 0.90, GFR >60, sodium 136, potassium 4.3 A1c 5.5%  Collected: 01/10/2020 Creatinine 0.85 mg/dL. eGFR: 77 mL/min per 1.73 m Hemoglobin 14.2 g/dL, hematocrit 95.6% Lipid profile: Total cholesterol 200, triglycerides 64, HDL 74, LDL 114, non-HDL 126 Hemoglobin A1c: 5.70 TSH: 0.83  FINAL MEDICATION LIST END OF ENCOUNTER: Meds ordered this encounter  Medications   hydrALAZINE (APRESOLINE) 100 MG tablet    Sig: Take 1 tablet (100 mg total) by mouth 2 (two) times daily. TAKE 1 TABLET(100 MG) BY MOUTH THREE TIMES DAILY    Dispense:  270 tablet    Refill:  1    **Patient requests 90 days supply**       Medications Discontinued During This Encounter  Medication Reason   cephALEXin (KEFLEX) 250 MG capsule    hydrALAZINE (APRESOLINE) 100 MG tablet       Current Outpatient Medications:    acetaminophen (TYLENOL) 500 MG tablet, Take 500 mg by mouth every 6 (six) hours as needed., Disp: , Rfl:    amLODipine (NORVASC) 2.5 MG tablet, TAKE 1 TABLET(2.5 MG) BY MOUTH DAILY, Disp: 90 tablet, Rfl: 3   CALCIUM CARB-CHOLECALCIFEROL PO, Take by mouth., Disp: , Rfl:    carboxymethylcellulose (REFRESH PLUS) 0.5 % SOLN, Place 1 drop into both eyes 2 (two) times daily as needed (dry eyes)., Disp: , Rfl:    cetirizine (ZYRTEC) 10 MG tablet, Take 10 mg by mouth daily., Disp: , Rfl:    Cholecalciferol (VITAMIN D3) 50 MCG (2000 UT) CHEW, Chew by mouth., Disp: , Rfl:    cycloSPORINE (RESTASIS) 0.05 % ophthalmic emulsion, 1 drop 2 (two) times daily., Disp: , Rfl:    fluticasone (FLONASE) 50 MCG/ACT nasal spray, Place into both nostrils daily., Disp: , Rfl:    memantine (NAMENDA) 10 MG tablet, Take 10 mg by mouth 2 (two) times daily., Disp: , Rfl:    Menthol, Topical Analgesic, (BIOFREEZE ROLL-ON) 4 % GEL, Apply topically., Disp: , Rfl:    Multiple Vitamin (MULTIVITAMIN WITH MINERALS) TABS tablet, Take 1 tablet by mouth daily., Disp: , Rfl:    olmesartan (BENICAR) 40 MG tablet, TAKE 1 TABLET(40 MG) BY MOUTH EVERY EVENING, Disp: 90 tablet, Rfl: 1   Omega-3 Fatty Acids (SALMON OIL-1000 PO), Take by mouth daily., Disp: , Rfl:    Turmeric POWD, , Disp: , Rfl:    hydrALAZINE (APRESOLINE) 100 MG tablet, Take 1 tablet (100 mg total) by mouth 2 (two) times daily. TAKE 1 TABLET(100 MG) BY MOUTH THREE TIMES DAILY, Disp: 270 tablet, Rfl: 1  IMPRESSION:    ICD-10-CM   1. Essential hypertension  I10 hydrALAZINE (APRESOLINE) 100 MG tablet    2. History of syncope  Z87.898     3. Loop recorder Biotronik 05/21/19  Z95.818        RECOMMENDATIONS: ARLIE POSCH is a 89 y.o. female whose past medical history and cardiac  risk factors include: Hypertension, hyperlipidemia, history of syncope, postmenopausal female, advanced age.  1. Essential hypertension Home and office BP are well controlled.  Husband thinks that her tired and fatigue is due to there BP meds and would like to reduce her dose of hydralazine from 100 mg po tid to bid. Will make  the change for now and they will continue to check home BP.  Low salt diet recommended.   2. History of syncope 3. Loop recorder Biotronik 05/21/19 No syncope since the last visit.  To reduce expense related to transmission patient has returned the transmitter.  Device has not been explanted just incase if she has an event it could be manually interrogated.   Asked the patient and her husband to discuss w/ PCP regarding her feeling tired and fatigue - non cardiac etiologies include but not limited to Vitamin D deficiency, thyroid disease, B 12 and folate, etc.     No orders of the defined types were placed in this encounter.  --Continue cardiac medications as reconciled in final medication list. --Return in about 1 year (around 10/04/2023) for Annual follow up visit. Or sooner if needed. --Continue follow-up with your primary care physician regarding the management of your other chronic comorbid conditions.  Patient's questions and concerns were addressed to her satisfaction. She voices understanding of the instructions provided during this encounter.   This note was created using a voice recognition software as a result there may be grammatical errors inadvertently enclosed that do not reflect the nature of this encounter. Every attempt is made to correct such errors.  Tessa Lerner, [place], [facility]  Pager:  [phone] Office: [phone]

## 2022-10-09 ENCOUNTER — Encounter: Payer: Self-pay | Admitting: Cardiology

## 2022-10-23 ENCOUNTER — Other Ambulatory Visit: Payer: Self-pay | Admitting: Cardiology

## 2022-10-23 DIAGNOSIS — I10 Essential (primary) hypertension: Secondary | ICD-10-CM

## 2023-01-02 ENCOUNTER — Other Ambulatory Visit: Payer: Self-pay

## 2023-01-02 ENCOUNTER — Emergency Department (HOSPITAL_COMMUNITY): Payer: Medicare Other

## 2023-01-02 ENCOUNTER — Encounter (HOSPITAL_COMMUNITY): Payer: Self-pay | Admitting: Student

## 2023-01-02 ENCOUNTER — Observation Stay (HOSPITAL_COMMUNITY)
Admission: EM | Admit: 2023-01-02 | Discharge: 2023-01-03 | Disposition: A | Discharge status: Home / Self Care | Payer: Medicare Other | Attending: Internal Medicine | Admitting: Internal Medicine

## 2023-01-02 DIAGNOSIS — I10 Essential (primary) hypertension: Secondary | ICD-10-CM | POA: Insufficient documentation

## 2023-01-02 DIAGNOSIS — R269 Unspecified abnormalities of gait and mobility: Secondary | ICD-10-CM | POA: Diagnosis not present

## 2023-01-02 DIAGNOSIS — Z79899 Other long term (current) drug therapy: Secondary | ICD-10-CM | POA: Insufficient documentation

## 2023-01-02 DIAGNOSIS — R4182 Altered mental status, unspecified: Secondary | ICD-10-CM | POA: Diagnosis not present

## 2023-01-02 DIAGNOSIS — G9389 Other specified disorders of brain: Secondary | ICD-10-CM | POA: Diagnosis not present

## 2023-01-02 DIAGNOSIS — Z86711 Personal history of pulmonary embolism: Secondary | ICD-10-CM

## 2023-01-02 DIAGNOSIS — Z950 Presence of cardiac pacemaker: Secondary | ICD-10-CM | POA: Insufficient documentation

## 2023-01-02 DIAGNOSIS — R0600 Dyspnea, unspecified: Secondary | ICD-10-CM | POA: Diagnosis not present

## 2023-01-02 DIAGNOSIS — R002 Palpitations: Secondary | ICD-10-CM | POA: Diagnosis not present

## 2023-01-02 DIAGNOSIS — R2689 Other abnormalities of gait and mobility: Secondary | ICD-10-CM | POA: Insufficient documentation

## 2023-01-02 DIAGNOSIS — R413 Other amnesia: Secondary | ICD-10-CM | POA: Diagnosis not present

## 2023-01-02 DIAGNOSIS — Z87898 Personal history of other specified conditions: Secondary | ICD-10-CM

## 2023-01-02 DIAGNOSIS — Z7901 Long term (current) use of anticoagulants: Secondary | ICD-10-CM | POA: Insufficient documentation

## 2023-01-02 DIAGNOSIS — G912 (Idiopathic) normal pressure hydrocephalus: Secondary | ICD-10-CM | POA: Diagnosis not present

## 2023-01-02 DIAGNOSIS — I2699 Other pulmonary embolism without acute cor pulmonale: Secondary | ICD-10-CM | POA: Diagnosis not present

## 2023-01-02 DIAGNOSIS — R0602 Shortness of breath: Secondary | ICD-10-CM | POA: Diagnosis not present

## 2023-01-02 DIAGNOSIS — K7689 Other specified diseases of liver: Secondary | ICD-10-CM | POA: Diagnosis not present

## 2023-01-02 LAB — URINALYSIS, ROUTINE W REFLEX MICROSCOPIC
Bilirubin Urine: NEGATIVE
Glucose, UA: NEGATIVE mg/dL
Hgb urine dipstick: NEGATIVE
Ketones, ur: NEGATIVE mg/dL
Leukocytes,Ua: NEGATIVE
Nitrite: NEGATIVE
Protein, ur: NEGATIVE mg/dL
Specific Gravity, Urine: 1.009 (ref 1.005–1.030)
pH: 6 (ref 5.0–8.0)

## 2023-01-02 LAB — CBC
HCT: 44.3 % (ref 36.0–46.0)
Hemoglobin: 15 g/dL (ref 12.0–15.0)
MCH: 31.5 pg (ref 26.0–34.0)
MCHC: 33.9 g/dL (ref 30.0–36.0)
MCV: 93.1 fL (ref 80.0–100.0)
Platelets: 248 10*3/uL (ref 150–400)
RBC: 4.76 MIL/uL (ref 3.87–5.11)
RDW: 11.9 % (ref 11.5–15.5)
WBC: 4.7 10*3/uL (ref 4.0–10.5)
nRBC: 0 % (ref 0.0–0.2)

## 2023-01-02 LAB — COMPREHENSIVE METABOLIC PANEL
ALT: 17 U/L (ref 0–44)
AST: 27 U/L (ref 15–41)
Albumin: 4.1 g/dL (ref 3.5–5.0)
Alkaline Phosphatase: 49 U/L (ref 38–126)
Anion gap: 13 (ref 5–15)
BUN: 16 mg/dL (ref 8–23)
CO2: 25 mmol/L (ref 22–32)
Calcium: 10.1 mg/dL (ref 8.9–10.3)
Chloride: 96 mmol/L — ABNORMAL LOW (ref 98–111)
Creatinine, Ser: 0.99 mg/dL (ref 0.44–1.00)
GFR, Estimated: 56 mL/min — ABNORMAL LOW (ref >60)
Glucose, Bld: 144 mg/dL — ABNORMAL HIGH (ref 70–99)
Potassium: 4.5 mmol/L (ref 3.5–5.1)
Sodium: 134 mmol/L — ABNORMAL LOW (ref 135–145)
Total Bilirubin: 0.6 mg/dL (ref 0.3–1.2)
Total Protein: 8 g/dL (ref 6.5–8.1)

## 2023-01-02 LAB — HEPARIN LEVEL (UNFRACTIONATED): Heparin Unfractionated: 0.53 [IU]/mL (ref 0.30–0.70)

## 2023-01-02 LAB — PROTIME-INR
INR: 1 (ref 0.8–1.2)
Prothrombin Time: 13.3 s (ref 11.4–15.2)

## 2023-01-02 LAB — TROPONIN I (HIGH SENSITIVITY)
Troponin I (High Sensitivity): 10 ng/L (ref <18)
Troponin I (High Sensitivity): 13 ng/L (ref <18)

## 2023-01-02 LAB — APTT: aPTT: 28 s (ref 24–36)

## 2023-01-02 LAB — TSH: TSH: 1.028 u[IU]/mL (ref 0.350–4.500)

## 2023-01-02 LAB — I-STAT CREATININE, ED: Creatinine, Ser: 1 mg/dL (ref 0.44–1.00)

## 2023-01-02 MED ORDER — POLYVINYL ALCOHOL 1.4 % OP SOLN: 1.0000 [drp] | Freq: Two times a day (BID) | OPHTHALMIC | Status: DC | PRN

## 2023-01-02 MED ORDER — AMLODIPINE BESYLATE 2.5 MG PO TABS
2.5000 mg | ORAL_TABLET | Freq: Every day | ORAL | Status: DC
  Administered 2023-01-03: 2.5 mg via ORAL
  Filled 2023-01-02 (×2): qty 1

## 2023-01-02 MED ORDER — IOHEXOL 350 MG/ML SOLN
60.0000 mL | Freq: Once | INTRAVENOUS | Status: AC | PRN
  Administered 2023-01-02: 60 mL via INTRAVENOUS

## 2023-01-02 MED ORDER — HEPARIN BOLUS VIA INFUSION
4000.0000 [IU] | Freq: Once | INTRAVENOUS | Status: AC
  Administered 2023-01-02: 4000 [IU] via INTRAVENOUS
  Filled 2023-01-02: qty 4000

## 2023-01-02 MED ORDER — ENSURE ENLIVE PO LIQD
237.0000 mL | Freq: Two times a day (BID) | ORAL | Status: DC
  Administered 2023-01-03: 237 mL via ORAL

## 2023-01-02 MED ORDER — FLUTICASONE PROPIONATE 50 MCG/ACT NA SUSP: 2.0000 | Freq: Every evening | NASAL | Status: DC | PRN

## 2023-01-02 MED ORDER — IRBESARTAN 300 MG PO TABS
300.0000 mg | ORAL_TABLET | Freq: Every day | ORAL | Status: DC
  Administered 2023-01-02 – 2023-01-03 (×2): 300 mg via ORAL
  Filled 2023-01-02 (×2): qty 1

## 2023-01-02 MED ORDER — HYDRALAZINE HCL 50 MG PO TABS
100.0000 mg | ORAL_TABLET | Freq: Two times a day (BID) | ORAL | Status: DC
  Administered 2023-01-02 – 2023-01-03 (×2): 100 mg via ORAL
  Filled 2023-01-02 (×2): qty 2

## 2023-01-02 MED ORDER — ACETAMINOPHEN 650 MG RE SUPP: 650.0000 mg | Freq: Four times a day (QID) | RECTAL | Status: DC | PRN

## 2023-01-02 MED ORDER — MUSCLE RUB 10-15 % EX CREA: 1.0000 | TOPICAL_CREAM | Freq: Two times a day (BID) | CUTANEOUS | Status: DC | PRN

## 2023-01-02 MED ORDER — SODIUM CHLORIDE 0.9% FLUSH
3.0000 mL | Freq: Two times a day (BID) | INTRAVENOUS | Status: DC
  Administered 2023-01-03: 3 mL via INTRAVENOUS

## 2023-01-02 MED ORDER — CYCLOSPORINE 0.05 % OP EMUL
1.0000 [drp] | Freq: Two times a day (BID) | OPHTHALMIC | Status: DC
  Administered 2023-01-02 – 2023-01-03 (×2): 1 [drp] via OPHTHALMIC
  Filled 2023-01-02 (×3): qty 30

## 2023-01-02 MED ORDER — ACETAMINOPHEN 325 MG PO TABS: 650.0000 mg | ORAL_TABLET | Freq: Four times a day (QID) | ORAL | Status: DC | PRN

## 2023-01-02 MED ORDER — MEMANTINE HCL 10 MG PO TABS
10.0000 mg | ORAL_TABLET | Freq: Two times a day (BID) | ORAL | Status: DC
  Administered 2023-01-02 – 2023-01-03 (×2): 10 mg via ORAL
  Filled 2023-01-02 (×2): qty 1

## 2023-01-02 MED ORDER — HEPARIN (PORCINE) 25000 UT/250ML-% IV SOLN
1050.0000 [IU]/h | INTRAVENOUS | Status: DC
  Administered 2023-01-02 – 2023-01-03 (×2): 1050 [IU]/h via INTRAVENOUS
  Filled 2023-01-02 (×3): qty 250

## 2023-01-02 MED ORDER — LORATADINE 10 MG PO TABS
10.0000 mg | ORAL_TABLET | Freq: Every day | ORAL | Status: DC
  Administered 2023-01-02 – 2023-01-03 (×2): 10 mg via ORAL
  Filled 2023-01-02 (×2): qty 1

## 2023-01-02 MED ORDER — ONDANSETRON HCL 4 MG/2ML IJ SOLN: 4.0000 mg | Freq: Four times a day (QID) | INTRAMUSCULAR | Status: DC | PRN

## 2023-01-02 NOTE — Progress Notes (Signed)
PHARMACY - ANTICOAGULATION CONSULT NOTE  Pharmacy Consult for heparin  Indication: pulmonary embolus  No Known Allergies  Patient Measurements: Height: 5' 5.5" (166.4 cm) Weight: 64.6 kg (142 lb 6.4 oz) IBW/kg (Calculated) : 58.15 Heparin Dosing Weight: 67.1 kg  Vital Signs: Temp: 98.9 F (37.2 C) (10/17 1905) Temp Source: Oral (10/17 1905) BP: 154/71 (10/17 1905) Pulse Rate: 81 (10/17 1905)  Labs: Recent Labs    01/02/23 0934 01/02/23 1004 01/02/23 1149 01/02/23 2052  HGB 15.0  --   --   --   HCT 44.3  --   --   --   PLT 248  --   --   --   APTT 28  --   --   --   LABPROT 13.3  --   --   --   INR 1.0  --   --   --   HEPARINUNFRC  --   --   --  0.53  CREATININE 0.99 1.00  --   --   TROPONINIHS 10  --  13  --     Estimated Creatinine Clearance: 37.1 mL/min (by C-G formula based on SCr of 1 mg/dL).   Medical History: Past Medical History:  Diagnosis Date   Dehydration    Dizziness    Elevated serum creatinine    Encounter for loop recorder check 06/27/2019   Glaucoma    Hyperlipidemia    Hypertension    Loop recorder Biotronik 05/21/19 06/08/2019   Right bundle branch block    Seasonal affective disorder (HCC)    Syncope and collapse 10/23/2018    Assessment: 88YOF with PMH of HTN, glaucoma, HLD, RBBB, syncope, and loop recorder presented with CC of palpitations. CTA positive for pulmonary embolus. Pharmacy consulted to dose heparin. Not on anticoagulation PTA.   Hgb 15, Plt 248, wnl. 1st Heparin level therapeutic at 0.53.  No signs/symptoms of bleeding noted  Goal of Therapy:  Heparin level 0.3-0.7 units/ml Monitor platelets by anticoagulation protocol: Yes   Plan:  Continue heparin infusion at 1050 units/hr Check anti-Xa level in 8 hours and daily while on heparin Continue to monitor CBC and signs/symptoms of bleeding  Cedric Fishman, PharmD, BCPS, BCCCP Clinical Pharmacist

## 2023-01-02 NOTE — ED Provider Notes (Signed)
[place] EMERGENCY DEPARTMENT AT [facility] Provider Note   CSN: [id number] Arrival date & time: 01/02/23  4098     History  Chief Complaint  Patient presents with   Palpitations    April Douglas is a 89 y.o. female with a hx of  hypertension, hyperlipidemia, RBBB, syncope, and loop recorder in place who presents to the ED with complaints of palpitations. Patient reports she woke up feeling fine, had coffee/breakfast, subsequently around 8:30AM she developed sxs of  palpitation, dyspnea, lightheadedness as well as confusion. Further describes confusion as not being able to remember phone numbers and just not feeling right, was however able to call her husband who brought her to the ED. Her husband relays she did not seem confused upon his return home. At present she states confusion has resolved as well as the dyspnea. She does remain with palpitations described as flutters, has had them intermittently for the past few days. Notes she felt as though she might pass out this AM but did not, does have a hx of syncope. Denies chest pain, nausea, vomiting, numbness, weakness, speech changes, seizure activity, headache, vision change, fever, chills, unilateral leg pain/swelling, hemoptysis, recent surgery/trauma, recent long travel, hormone use, personal hx of cancer, or hx of DVT/PE.   Followed by [facility] for her syncope and hypertension hx, most recently seen 10/04/22, had returned the transmitter to reduce cost, device remains in place and can be manually interrogated.    HPI     Home Medications Prior to Admission medications   Medication Sig Start Date End Date Taking? Authorizing Provider  acetaminophen (TYLENOL) 500 MG tablet Take 2 tablets by mouth every 6 (six) hours as needed for mild pain (pain score 1-3).   Yes [provider]  amLODipine (NORVASC) 2.5 MG tablet TAKE 1 TABLET(2.5 MG) BY MOUTH DAILY Patient taking differently: Take 1 tablet by  mouth daily. Takes at 1400 06/12/22  Yes Tolia, Sunit, DO  CALCIUM CARB-CHOLECALCIFEROL PO Take 1 tablet by mouth daily.   Yes [provider]  carboxymethylcellulose (REFRESH PLUS) 0.5 % SOLN Place 1 drop into both eyes 2 (two) times daily as needed (dry eyes).   Yes [provider]  cetirizine (ZYRTEC) 10 MG tablet Take 1 tablet by mouth daily as needed for allergies or rhinitis.   Yes [provider]  Cholecalciferol (VITAMIN D3) 50 MCG (2000 UT) CHEW Chew 1 Dose by mouth daily as needed (per instructions).   Yes [provider]  cycloSPORINE (RESTASIS) 0.05 % ophthalmic emulsion 1 drop 2 (two) times daily.   Yes [provider]  estradiol (ESTRACE) 0.1 MG/GM vaginal cream 1 Applicatorful once a week. Medication is 0.5/gm Takes two times per week 11/12/22  Yes [provider]  fluticasone (FLONASE) 50 MCG/ACT nasal spray Place 2 sprays into both nostrils at bedtime as needed for allergies or rhinitis (Takes at night when taking Zyrtec).   Yes [provider]  hydrALAZINE (APRESOLINE) 100 MG tablet Take 1 tablet (100 mg total) by mouth 2 (two) times daily. TAKE 1 TABLET(100 MG) BY MOUTH THREE TIMES DAILY Patient taking differently: Take 1 tablet by mouth 2 (two) times daily. 10/04/22  Yes Tolia, Sunit, DO  memantine (NAMENDA) 10 MG tablet Take 1 tablet by mouth 2 (two) times daily.   Yes [provider]  Menthol, Topical Analgesic, (BIOFREEZE ROLL-ON) 4 % GEL Apply 1 Application topically 2 (two) times daily as needed (For arthritis pain).   Yes [provider]  Multiple Vitamin (MULTIVITAMIN WITH MINERALS) TABS tablet Take 1 tablet by mouth daily as needed (Takes when she remembers).   Yes [provider]  olmesartan (BENICAR) 40 MG tablet TAKE 1 TABLET(40 MG) BY MOUTH EVERY EVENING 10/23/22  Yes Tolia, Sunit, DO  Omega-3 Fatty Acids (SALMON OIL-1000 PO) Take 1 capsule by mouth daily as needed (as instructed).   Yes  [provider]  Turmeric (QC TUMERIC COMPLEX) 500 MG CAPS Take 1 capsule by mouth daily as needed (as instructed).   Yes [provider]      Allergies    Patient has no known allergies.    Review of Systems   Review of Systems  Constitutional:  Negative for chills and fever.  Eyes:  Negative for visual disturbance.  Respiratory:  Positive for shortness of breath.   Cardiovascular:  Positive for palpitations.  Gastrointestinal:  Negative for abdominal pain, nausea and vomiting.  Genitourinary:  Negative for dysuria.  Neurological:  Positive for light-headedness. Negative for dizziness, seizures, syncope (today), speech difficulty, weakness and headaches.       Positive for confusion.   All other systems reviewed and are negative.   Physical Exam Updated Vital Signs BP (!) 164/85   Pulse 80   Temp 98.1 F (36.7 C) (Oral)   Resp 14   Wt 67.1 kg   SpO2 100%   BMI 25.40 kg/m  Physical Exam Vitals and nursing note reviewed.  Constitutional:      General: She is not in acute distress.    Appearance: She is well-developed. She is not toxic-appearing.  HENT:     Head: Normocephalic and atraumatic.  Eyes:     General:        Right eye: No discharge.        Left eye: No discharge.     Conjunctiva/sclera: Conjunctivae normal.  Cardiovascular:     Rate and Rhythm: Tachycardia present. Rhythm irregular.     Pulses:          Radial pulses are 2+ on the right side and 2+ on the left side.  Pulmonary:     Effort: No respiratory distress.     Breath sounds: Normal breath sounds. No wheezing or rales.  Abdominal:     General: There is no distension.     Palpations: Abdomen is soft.     Tenderness: There is no abdominal tenderness.  Musculoskeletal:     Cervical back: Neck supple.     Right lower leg: No edema.     Left lower leg: No edema.  Skin:    General: Skin is warm and dry.  Neurological:     Mental Status: She is alert.     Comments: Alert. Clear  speech. No facial droop. CNIII-XII grossly intact. Bilateral upper and lower extremities' sensation grossly intact. 5/5 symmetric strength with grip strength and with plantar and dorsi flexion bilaterally.. Intact finger to nose bilaterally. Negative pronator drift.   Psychiatric:        Behavior: Behavior normal.     ED Results / Procedures / Treatments   Labs (all labs ordered are listed, but only abnormal results are displayed) Labs Reviewed  COMPREHENSIVE METABOLIC PANEL - Abnormal; Notable for the following components:      Result Value   Sodium 134 (*)    Chloride 96 (*)    Glucose, Bld 144 (*)    GFR, Estimated 56 (*)    All other components within normal limits  CBC  PROTIME-INR  APTT  URINALYSIS, ROUTINE W REFLEX MICROSCOPIC  HEPARIN LEVEL (UNFRACTIONATED)  I-STAT CREATININE, ED  TROPONIN I (HIGH SENSITIVITY)  TROPONIN I (HIGH SENSITIVITY)    EKG EKG Interpretation Date/Time:  Thursday January 02 2023 09:49:28 EDT Ventricular Rate:  74 PR Interval:  140 QRS Duration:  120 QT Interval:  398 QTC Calculation: 442 R Axis:   77  Text Interpretation: Sinus rhythm Atrial premature complexes Probable left atrial enlargement IVCD, consider atypical RBBB Confirmed by Kristine Royal [phone]) on 01/02/2023 10:01:15 AM  Radiology CT Angio Chest PE W/Cm &/Or Wo Cm  Result Date: 01/02/2023 CLINICAL DATA:  Shortness of breath EXAM: CT ANGIOGRAPHY CHEST WITH CONTRAST TECHNIQUE: Multidetector CT imaging of the chest was performed using the standard protocol during bolus administration of intravenous contrast. Multiplanar CT image reconstructions and MIPs were obtained to evaluate the vascular anatomy. RADIATION DOSE REDUCTION: This exam was performed according to the departmental dose-optimization program which includes automated exposure control, adjustment of the mA and/or kV according to patient size and/or use of iterative reconstruction technique. CONTRAST:  60mL OMNIPAQUE  IOHEXOL 350 MG/ML SOLN COMPARISON:  Same day chest radiograph FINDINGS: Cardiovascular: There is adequate opacification of the pulmonary arteries to the segmental level. There is linear web-like nonocclusive filling defect at the bifurcation of the left upper lobe segmental pulmonary artery (6-045). No other pulmonary emboli are seen. The heart size is normal. There is no evidence of right heart strain. There is no pericardial effusion. The thoracic aorta is unremarkable. Mediastinum/Nodes: The thyroid is grossly unremarkable. The esophagus is grossly unremarkable. There is no mediastinal, hilar, or axillary lymphadenopathy. Lungs/Pleura: The trachea and central airways are patent. The lungs well inflated. There is no focal consolidation or pulmonary edema. There is no pleural effusion or pneumothorax. A calcified granuloma is again noted in the right upper lobe. There are no suspicious nodules. Upper Abdomen: Multiple renal and hepatic cysts are noted requiring no specific imaging follow-up. Musculoskeletal: There is age-indeterminate but chronic appearing compression deformity of the T11 and T12 vertebral bodies with up to approximately 50% loss of vertebral body height but no bony retropulsion. Review of the MIP images confirms the above findings. IMPRESSION: 1. Linear web-like nonocclusive filling defect at the bifurcation of the left upper lobe segmental pulmonary artery consistent with pulmonary embolus, possibly chronic. No evidence of right heart strain or other pulmonary emboli. 2. No focal consolidation or pulmonary edema. 3. Age-indeterminate but chronic appearing compression deformities of the T11 and T12 vertebral bodies with up to approximately 50% loss of vertebral body height but no bony retropulsion. These results were called by telephone at the time of interpretation on 01/02/2023 at 12:57 pm to provider Dr Rodena Medin, who verbally acknowledged these results. Electronically Signed   By: Lesia Hausen  M.D.   On: 01/02/2023 12:57   DG Chest Portable 1 View  Result Date: 01/02/2023 CLINICAL DATA:  Dyspnea, palpitations EXAM: PORTABLE CHEST 1 VIEW COMPARISON:  Same-day CT chest, chest radiograph 11/04/2019 FINDINGS: The cardiomediastinal silhouette is stable. A loop recorder is noted There is no focal consolidation or pulmonary edema. There is no pleural effusion or pneumothorax. A calcified granuloma in the right upper lobe is unchanged. There is no acute osseous abnormality. IMPRESSION: No radiographic evidence of acute cardiopulmonary process. Electronically Signed   By: Lesia Hausen M.D.   On: 01/02/2023 12:38   CT Head Wo Contrast  Result Date: 01/02/2023 CLINICAL DATA:  Mental status change with unknown cause. EXAM: CT HEAD WITHOUT CONTRAST  TECHNIQUE: Contiguous axial images were obtained from the base of the skull through the vertex without intravenous contrast. RADIATION DOSE REDUCTION: This exam was performed according to the departmental dose-optimization program which includes automated exposure control, adjustment of the mA and/or kV according to patient size and/or use of iterative reconstruction technique. COMPARISON:  None Available. FINDINGS: Brain: No evidence of acute infarction, hemorrhage, obstructive hydrocephalus, extra-axial collection or mass lesion/mass effect. Disproportionate subarachnoid spaces. Calcification along the inner table of the left frontal convexity measuring 16 mm, somewhat heterogeneous on axial images but fairly defined with corticomedullary differentiation on coronal reformats. No edema in the adjacent brain. Vascular: No hyperdense vessel or unexpected calcification. Skull: Normal. Negative for fracture or focal lesion. Sinuses/Orbits: No acute finding. IMPRESSION: 1. No acute finding. 2. Ventriculomegaly with disproportionate subarachnoid spaces, correlate for symptoms of NPH. 3. Calcified meningioma or osteoma along the left frontal convexity, only 16 mm.  Electronically Signed   By: Tiburcio Pea M.D.   On: 01/02/2023 11:44    Procedures .Critical Care  Performed by: Cherly Anderson, PA-C Authorized by: Cherly Anderson, PA-C     CRITICAL CARE Performed by: Harvie Heck   Total critical care time: 31 minutes  Critical care time was exclusive of separately billable procedures and treating other patients.  Critical care was necessary to treat or prevent imminent or life-threatening deterioration.  Critical care was time spent personally by me on the following activities: development of treatment plan with patient and/or surrogate as well as nursing, discussions with consultants, evaluation of patient's response to treatment, examination of patient, obtaining history from patient or surrogate, ordering and performing treatments and interventions, ordering and review of laboratory studies, ordering and review of radiographic studies, pulse oximetry and re-evaluation of patient's condition.    Medications Ordered in ED Medications  heparin ADULT infusion 100 units/mL (25000 units/245mL) (1,050 Units/hr Intravenous New Bag/Given 01/02/23 1356)  iohexol (OMNIPAQUE) 350 MG/ML injection 60 mL (60 mLs Intravenous Contrast Given 01/02/23 1215)  heparin bolus via infusion 4,000 Units (4,000 Units Intravenous Bolus from Bag 01/02/23 1356)    ED Course/ Medical Decision Making/ A&P  Click here for ABCD2, HEART and other calculatorsREFRESH Note before signing :1}                              Medical Decision Making Amount and/or Complexity of Data Reviewed Labs: ordered. Radiology: ordered.  Risk Prescription drug management. Decision regarding hospitalization.   Patient presents to the ED with complaints of palpitations/dyspnea/confusion, this involves an extensive number of treatment options, and is a complaint that carries with it a high risk of complications and morbidity. Nontoxic, vitals w/ mild tachycardia on  arrival. No focal neuro deficits.   Additional history obtained:  Chart/nursing notes reviewed.  External records viewed including: most recent [facility]   EKG: sinus, premature comple  Lab Tests:  I viewed & interpreted labs including:  CBC: Unremarkable CMP: no critical electrolyte derangements APTT/PT/INR: WNL UA: Unremarkable Troponin: WNL-  Imaging Studies:  I ordered and viewed the following imaging, agree with radiologist impression:  CT head:  1. No acute finding. 2. Ventriculomegaly with disproportionate subarachnoid spaces, correlate for symptoms of NPH. 3. Calcified meningioma or osteoma along the left frontal convexity, only 16 mm. CXR:No radiographic evidence of acute cardiopulmonary process CTA chest:  1. Linear web-like nonocclusive filling defect at the bifurcation of the left upper lobe segmental pulmonary artery consistent with pulmonary  embolus, possibly chronic. No evidence of right heart strain or other pulmonary emboli. 2. No focal consolidation or pulmonary edema. 3. Age-indeterminate but chronic appearing compression deformities of the T11 and T12 vertebral bodies with up to approximately 50% loss of vertebral body height but no bony retropulsion. These results were called by telephone at the time of interpretation on 01/02/2023 at 12:57 pm to provider Dr Rodena Medin, who verbally acknowledged these results.  ED Course:  Work up notable for left upper lobe segmental PE, no radiographic evidence of heart strain. Initiated on heparin, bilateral venous duplex ordered. In regards to compression deformities- chronic appearing, no acute neuro deficits & ambulatory. Requested loop recorder interrogation by nursing staff. Plan for admission.   Discussed with hospitalist Dr. Katrinka Blazing, accepts admission.   Findings and plan of care discussed with supervising physician Dr. Rodena Medin who is in agreement.   Portions of this note were generated with Herbal[employer]. Dictation errors may occur despite best attempts at proofreading.   Final Clinical Impression(s) / ED Diagnoses Final diagnoses:  Pulmonary embolism without acute cor pulmonale, unspecified chronicity, unspecified pulmonary embolism type [facility])    Rx / DC Orders ED Discharge Orders     None         Cherly Anderson, PA-C 01/02/23 1607    Wynetta Fines, MD 01/02/23 [phone]

## 2023-01-02 NOTE — Progress Notes (Signed)
01/02/2023 1900 Received pt to room 3E-13 from ED with Dx PE.  Pt currently has heparin infusing.  Tele monitor applied and CCMD notofoed.  CHG bath completed.  Oriented to bed, call light and bed.  Call bell in reach.  Kathryne Hitch

## 2023-01-02 NOTE — ED Notes (Signed)
Patient transported to CT 

## 2023-01-02 NOTE — ED Notes (Signed)
Pt reports palpitations this AM.  Pt states she feels like "fluttering" along with some shortness of breath.

## 2023-01-02 NOTE — Progress Notes (Signed)
Venous duplex lower ext  has been completed. Refer to [facility] under chart review to view preliminary results.   01/02/2023  2:54 PM Jaycion Treml, Gerarda Gunther

## 2023-01-02 NOTE — ED Triage Notes (Signed)
Pt reports palpitations that started this morning. Pt also stating that after drinking her coffee she became disoriented. PT reports she could find her phone but unable to remember the right numbers she needed to call.

## 2023-01-02 NOTE — H&P (Addendum)
History and Physical    Patient: April Douglas WGN:[id number] DOB: [birth year] DOA: 01/02/2023 DOS: the patient was seen and examined on 01/02/2023 PCP: Clayborn Heron, MD  Patient coming from: Home  Chief Complaint:  Chief Complaint  Patient presents with   Palpitations   HPI: April Douglas is a 89 y.o. female with medical history significant of hypertension, hyperlipidemia, right bundle branch block, syncope, palpitations s/p loop recorder, and glaucoma who presents with complaints of palpitations with shortness of breath.  Patient reports she had not eaten breakfast this morning when she woke up at around 8 AM which is not unusual for her.  She had taken her morning medications and had coffee.  At around 8:30 AM she started feeling her heart fluttering.  Noted associated symptoms of shortness of breath, lightheadedness, urinary frequency, and some confusion.  She notes that she is intermittently had the heart fluttering feeling intermittently over the last 2 days.  Denies having any loss of consciousness.  Her husband came home just in time as he had been at an appointment and brought her to the hospital.  Patient also makes note that she has had no appetite and lost unknown amount of weight as her close her fitting looser.  She has not started any new medications.  She is up-to-date on all of her screening and has received all of her vaccinations including COVID and flu.  Denies any recent fever, cough, abdominal pain, nausea, vomiting, diarrhea, prior history of blood clots, leg swelling, calf pain, travel, known or known malignancy.  She does use vaginal estrogen creams, but is not on any oral estrogen supplements.  He normally ambulates with the use of a cane and has not had any falls.  Review of records note patient had loop recorder interrogated last in April of this year which noted normal sinus rhythm with no atrial fibrillation or heart block noted.  Patient states that the  monitoring service was discontinued after that visit.  In the emergency department patient was noted to be mildly tachypneic and tachycardic. B noted lood pressures elevated up to 164/85, but O2 saturations were maintained on room air.  Labs appear to be relatively unremarkable with high-sensitivity troponin 10.  Chest x-ray was clear.  CT scan of the head noted no acute findings, but present of ventriculomegaly with disproportionate subarachnoid spaces concerning for normal pressure hydrocephalus.  Urinalysis did not show any signs for infection.  CT angiogram of the chest was done due to reports of shortness of breath which revealed minor weblike nonocclusive filling defect at the bifurcation of the left upper lobe consistent with a pulmonary embolism that was thought possibly chronic with no evidence of right heart strain.  Review of Systems: As mentioned in the history of present illness. All other systems reviewed and are negative. Past Medical History:  Diagnosis Date   Dehydration    Dizziness    Elevated serum creatinine    Encounter for loop recorder check 06/27/2019   Glaucoma    Hyperlipidemia    Hypertension    Loop recorder Biotronik 05/21/19 06/08/2019   Right bundle branch block    Seasonal affective disorder (HCC)    Syncope and collapse 10/23/2018   Past Surgical History:  Procedure Laterality Date   ABDOMINAL HYSTERECTOMY     TAH-BSO   CATARACT EXTRACTION Bilateral    COLONOSCOPY WITH PROPOFOL N/A 02/06/2015   Procedure: COLONOSCOPY WITH PROPOFOL;  Surgeon: Charolett Bumpers, MD;  Location: [facility];  Service:  Endoscopy;  Laterality: N/A;   LOOP RECORDER IMPLANT  05/2019   tooth extraction  2020   Social History:  reports that she has never smoked. She has never used smokeless tobacco. She reports that she does not drink alcohol and does not use drugs.  No Known Allergies  Family History  Problem Relation Age of Onset   Hypertension Mother    Hypertension Father     Stroke Sister    Cancer Brother    Heart disease Brother    Cancer Brother    Hypertension Sister    Breast cancer Neg Hx     Prior to Admission medications   Medication Sig Start Date End Date Taking? Authorizing Provider  acetaminophen (TYLENOL) 500 MG tablet Take 2 tablets by mouth every 6 (six) hours as needed for mild pain (pain score 1-3).   Yes [provider]  amLODipine (NORVASC) 2.5 MG tablet TAKE 1 TABLET(2.5 MG) BY MOUTH DAILY Patient taking differently: Take 1 tablet by mouth daily. Takes at 1400 06/12/22  Yes Tolia, Sunit, DO  CALCIUM CARB-CHOLECALCIFEROL PO Take 1 tablet by mouth daily.   Yes [provider]  carboxymethylcellulose (REFRESH PLUS) 0.5 % SOLN Place 1 drop into both eyes 2 (two) times daily as needed (dry eyes).   Yes [provider]  cetirizine (ZYRTEC) 10 MG tablet Take 1 tablet by mouth daily as needed for allergies or rhinitis.   Yes [provider]  Cholecalciferol (VITAMIN D3) 50 MCG (2000 UT) CHEW Chew 1 Dose by mouth daily as needed (per instructions).   Yes [provider]  cycloSPORINE (RESTASIS) 0.05 % ophthalmic emulsion 1 drop 2 (two) times daily.   Yes [provider]  estradiol (ESTRACE) 0.1 MG/GM vaginal cream 1 Applicatorful once a week. Medication is 0.5/gm Takes two times per week 11/12/22  Yes [provider]  fluticasone (FLONASE) 50 MCG/ACT nasal spray Place 2 sprays into both nostrils at bedtime as needed for allergies or rhinitis (Takes at night when taking Zyrtec).   Yes [provider]  hydrALAZINE (APRESOLINE) 100 MG tablet Take 1 tablet (100 mg total) by mouth 2 (two) times daily. TAKE 1 TABLET(100 MG) BY MOUTH THREE TIMES DAILY Patient taking differently: Take 1 tablet by mouth 2 (two) times daily. 10/04/22  Yes Tolia, Sunit, DO  memantine (NAMENDA) 10 MG tablet Take 1 tablet by mouth 2 (two) times daily.   Yes [provider]  Menthol, Topical Analgesic,  (BIOFREEZE ROLL-ON) 4 % GEL Apply 1 Application topically 2 (two) times daily as needed (For arthritis pain).   Yes [provider]  Multiple Vitamin (MULTIVITAMIN WITH MINERALS) TABS tablet Take 1 tablet by mouth daily as needed (Takes when she remembers).   Yes [provider]  olmesartan (BENICAR) 40 MG tablet TAKE 1 TABLET(40 MG) BY MOUTH EVERY EVENING 10/23/22  Yes Tolia, Sunit, DO  Omega-3 Fatty Acids (SALMON OIL-1000 PO) Take 1 capsule by mouth daily as needed (as instructed).   Yes [provider]  Turmeric (QC TUMERIC COMPLEX) 500 MG CAPS Take 1 capsule by mouth daily as needed (as instructed).   Yes [provider]    Physical Exam: Vitals:   01/02/23 1145 01/02/23 1230 01/02/23 1300 01/02/23 1347  BP: 127/86 (!) 163/76 (!) 164/85   Pulse: 86 71 80   Resp: (!) 23 11 14    Temp:    98.1 F (36.7 C)  TempSrc:    Oral  SpO2: 99% 98% 100%   Weight:  67.1 kg    Exam  Constitutional: Elderly for female who appears to be in no acute distress able to follow commands Eyes: PERRL, lids and conjunctivae normal ENMT: Mucous membranes are moist.  Fair dentition Neck: normal, supple, no masses, no thyromegaly Respiratory: clear to auscultation bilaterally, no wheezing, no crackles. Normal respiratory effort. No accessory muscle use.  Cardiovascular: Regular rate and rhythm, no murmurs / rubs / gallops. No extremity edema. 2+ pedal pulses. No carotid bruits.  Abdomen: no tenderness, no masses palpated. No hepatosplenomegaly. Bowel sounds positive.  Musculoskeletal: no clubbing / cyanosis. No joint deformity upper and lower extremities. Good ROM, no contractures. Normal muscle tone.  Skin: no rashes, lesions, ulcers. No induration Neurologic: CN 2-12 grossly intact. Sensation intact, DTR normal. Strength 5/5 in all 4.  Psychiatric: Normal judgment and insight. Alert and oriented x 3. Normal mood.   Data Reviewed:  EKG revealed sinus tachycardia with  supraventricular bigeminy and a right bundle branch block.  Reviewed labs, imaging, and pertinent records as documented.  Assessment and Plan:  Pulmonary embolism Suspected to be acute..  Patient presented with complaints of palpitations and shortness of breath.  CT angiogram of the chest noted linear weblike nonocclusive filling defect at the bifurcation of the left upper lobe segmental pulmonary artery consistent with pulmonary embolism thought to possibly be chronic. -Admit to a telemetry bed -Strict bedrest until patient has been on 24 hours of anticoagulation unless Doppler ultrasound of the lower extremity negative for DVT -Heparin drip per pharmacy -Interrogate loop recorder if able -Check Doppler ultrasound of the lower extremities -Check echocardiogram -Transition to oral anticoagulation in a.m.  Palpitations Syncope and collapse Patient reports intermittently having palpitations over the last 1 to 2 days.  Prior history of syncope and palpitations for which patient had loop recorder placed in 2021.  Patient notes that loop recorder stopped transmitting after appointment with her cardiologist back in April of this year.  Question possible atrial fibrillation given findings concerning for pulmonary embolism. -Interrogate loop recorder if possible -Check TSH -Follow-up telemetry -May need to be set up with a Holter monitor if loop recorder not functioning  Essential hypertension Blood pressure noted to be elevated up to 164/85. -Continue amlodipine, hydralazine, and pharmacy substitution of olmesartan -Continue hydralazine IV as needed  Memory loss Patient states that she gets a little confused from time to time, but normally is alert and oriented x 3.   -Delirium precautions -Continue Namenda  Normal pressure hydrocephalus CT scan of the head noted enlarged ventricles concerning for normal pressure hydrocephalus.  Patient made note of intermittent confusion, urinary  frequency, and balance issues from time consistent with CT findings.  Urinalysis had not shown any signs for infection. -Consider referral to neurology in the outpatient setting  Weight loss Patient reports losing unknown amount of weight and lack of appetite. -Follow-up TSH -May warrant further investigation  Gait disturbance Patient reports being a little off balance at baseline for which she ambulates with a cane. -Up with assistance  DVT prophylaxis: Heparin Advance Care Planning:   Code Status: Full Code   Consults: None  Family Communication: Husband updated at bedside  Severity of Illness: The appropriate patient status for this patient is OBSERVATION. Observation status is judged to be reasonable and necessary in order to provide the required intensity of service to ensure the patient's safety. The patient's presenting symptoms, physical exam findings, and initial radiographic and laboratory data in the context of their medical condition is felt to place  them at decreased risk for further clinical deterioration. Furthermore, it is anticipated that the patient will be medically stable for discharge from the hospital within 2 midnights of admission.   Author: Clydie Braun, MD 01/02/2023 2:11 PM  For on call review [email or web address].

## 2023-01-02 NOTE — ED Notes (Signed)
Pt reported that her loop cardiac monitor was not currently active when this RN went to interrogate it.

## 2023-01-02 NOTE — Progress Notes (Addendum)
PHARMACY - ANTICOAGULATION CONSULT NOTE  Pharmacy Consult for heparin  Indication: pulmonary embolus  No Known Allergies  Patient Measurements: Weight: 67.1 kg (148 lb) Heparin Dosing Weight: 67.1 kg  Vital Signs: Temp: 98 F (36.7 C) (10/17 0932) Temp Source: Oral (10/17 0932) BP: 164/85 (10/17 1300) Pulse Rate: 80 (10/17 1300)  Labs: Recent Labs    01/02/23 0934 01/02/23 1004 01/02/23 1149  HGB 15.0  --   --   HCT 44.3  --   --   PLT 248  --   --   APTT 28  --   --   LABPROT 13.3  --   --   INR 1.0  --   --   CREATININE 0.99 1.00  --   TROPONINIHS 10  --  13    Estimated Creatinine Clearance: 38.1 mL/min (by C-G formula based on SCr of 1 mg/dL).   Medical History: Past Medical History:  Diagnosis Date   Dehydration    Dizziness    Elevated serum creatinine    Encounter for loop recorder check 06/27/2019   Glaucoma    Hyperlipidemia    Hypertension    Loop recorder Biotronik 05/21/19 06/08/2019   Right bundle branch block    Seasonal affective disorder (HCC)    Syncope and collapse 10/23/2018    Assessment: 88YOF with PMH of HTN, glaucoma, HLD, RBBB, syncope, and loop recorder presented with CC of palpitations. CTA positive for pulmonary embolus. Pharmacy consulted to dose heparin. Not on anticoagulation PTA.   Hgb 15, Plt 248, wnl No signs/symptoms of bleeding noted  Goal of Therapy:  Heparin level 0.3-0.7 units/ml Monitor platelets by anticoagulation protocol: Yes   Plan:  Give 4000 units heparin bolus x 1 Start heparin infusion at 1050 units/hr Check anti-Xa level in 8 hours and daily while on heparin Continue to monitor CBC and signs/symptoms of bleeding   Stephenie Acres, PharmD PGY1 Pharmacy Resident 01/02/2023 1:35 PM

## 2023-01-02 NOTE — ED Notes (Signed)
ED TO INPATIENT HANDOFF REPORT  ED Nurse Name and Phone #: Morrie Sheldon [phone]  S Name/Age/Gender April Douglas 89 y.o. female Room/Bed: 038C/038C  Code Status   Code Status: Full Code  Home/SNF/Other Home Patient oriented to: self, place, time, and situation Is this baseline? Yes   Triage Complete: Triage complete  Chief Complaint Palpitations [R00.2]  Triage Note Pt reports palpitations that started this morning. Pt also stating that after drinking her coffee she became disoriented. PT reports she could find her phone but unable to remember the right numbers she needed to call.    Allergies No Known Allergies  Level of Care/Admitting Diagnosis ED Disposition     ED Disposition  Admit   Condition  --   Comment  Hospital Area: [facility] [[id number]]  Level of Care: Telemetry Medical [104]  May place patient in observation at [facility] or [facility] if equivalent level of care is available:: No  Covid Evaluation: Asymptomatic - no recent exposure (last 10 days) testing not required  Diagnosis: Palpitations [785.1.ICD-9-CM]  Admitting Physician: Clydie Braun [[id number]]  Attending Physician: Clydie Braun [[id number]]          B Medical/Surgery History Past Medical History:  Diagnosis Date   Dehydration    Dizziness    Elevated serum creatinine    Encounter for loop recorder check 06/27/2019   Glaucoma    Hyperlipidemia    Hypertension    Loop recorder Biotronik 05/21/19 06/08/2019   Right bundle branch block    Seasonal affective disorder (HCC)    Syncope and collapse 10/23/2018   Past Surgical History:  Procedure Laterality Date   ABDOMINAL HYSTERECTOMY     TAH-BSO   CATARACT EXTRACTION Bilateral    COLONOSCOPY WITH PROPOFOL N/A 02/06/2015   Procedure: COLONOSCOPY WITH PROPOFOL;  Surgeon: Charolett Bumpers, MD;  Location: [facility];  Service: Endoscopy;  Laterality: N/A;   LOOP RECORDER IMPLANT  05/2019   tooth extraction  2020      A IV Location/Drains/Wounds Patient Lines/Drains/Airways Status     Active Line/Drains/Airways     Name Placement date Placement time Site Days   Peripheral IV 01/02/23 20 G Left;Posterior Forearm 01/02/23  1151  Forearm  less than 1            Intake/Output Last 24 hours No intake or output data in the 24 hours ending 01/02/23 1703  Labs/Imaging Results for orders placed or performed during the hospital encounter of 01/02/23 (from the past 48 hour(s))  CBC     Status: None   Collection Time: 01/02/23  9:34 AM  Result Value Ref Range   WBC 4.7 4.0 - 10.5 K/uL   RBC 4.76 3.87 - 5.11 MIL/uL   Hemoglobin 15.0 12.0 - 15.0 g/dL   HCT 78.2 95.6 - 21.3 %   MCV 93.1 80.0 - 100.0 fL   MCH 31.5 26.0 - 34.0 pg   MCHC 33.9 30.0 - 36.0 g/dL   RDW 08.6 57.8 - 46.9 %   Platelets 248 150 - 400 K/uL   nRBC 0.0 0.0 - 0.2 %    Comment: Performed at [facility], [address], [place], [place] [address]  Comprehensive metabolic panel     Status: Abnormal   Collection Time: 01/02/23  9:34 AM  Result Value Ref Range   Sodium 134 (L) 135 - 145 mmol/L   Potassium 4.5 3.5 - 5.1 mmol/L   Chloride 96 (L) 98 - 111 mmol/L  CO2 25 22 - 32 mmol/L   Glucose, Bld 144 (H) 70 - 99 mg/dL    Comment: Glucose reference range applies only to samples taken after fasting for at least 8 hours.   BUN 16 8 - 23 mg/dL   Creatinine, Ser 1.61 0.44 - 1.00 mg/dL   Calcium 09.6 8.9 - 04.5 mg/dL   Total Protein 8.0 6.5 - 8.1 g/dL   Albumin 4.1 3.5 - 5.0 g/dL   AST 27 15 - 41 U/L   ALT 17 0 - 44 U/L   Alkaline Phosphatase 49 38 - 126 U/L   Total Bilirubin 0.6 0.3 - 1.2 mg/dL   GFR, Estimated 56 (L) >60 mL/min    Comment: (NOTE) Calculated using the CKD-EPI Creatinine Equation (2021)    Anion gap 13 5 - 15    Comment: Performed at [facility], [address], [place], [place] [address]  Protime-INR     Status: None   Collection Time: 01/02/23  9:34 AM  Result Value Ref Range   Prothrombin  Time 13.3 11.4 - 15.2 seconds   INR 1.0 0.8 - 1.2    Comment: (NOTE) INR goal varies based on device and disease states. Performed at [facility], [address] [place], [place] [address]   APTT     Status: None   Collection Time: 01/02/23  9:34 AM  Result Value Ref Range   aPTT 28 24 - 36 seconds    Comment: Performed at [facility], [address] [place], [place] [address]  Troponin I (High Sensitivity)     Status: None   Collection Time: 01/02/23  9:34 AM  Result Value Ref Range   Troponin I (High Sensitivity) 10 <18 ng/L    Comment: (NOTE) Elevated high sensitivity troponin I (hsTnI) values and significant  changes across serial measurements may suggest ACS but many other  chronic and acute conditions are known to elevate hsTnI results.  Refer to the "Links" section for chest pain algorithms and additional  guidance. Performed at [facility], [address] [place], [place] [address]   I-Stat Creatinine, ED (not at [facility] or [facility])     Status: None   Collection Time: 01/02/23 10:04 AM  Result Value Ref Range   Creatinine, Ser 1.00 0.44 - 1.00 mg/dL  Urinalysis, Routine w reflex microscopic -Urine, Clean Catch     Status: None   Collection Time: 01/02/23 11:46 AM  Result Value Ref Range   Color, Urine YELLOW YELLOW   APPearance CLEAR CLEAR   Specific Gravity, Urine 1.009 1.005 - 1.030   pH 6.0 5.0 - 8.0   Glucose, UA NEGATIVE NEGATIVE mg/dL   Hgb urine dipstick NEGATIVE NEGATIVE   Bilirubin Urine NEGATIVE NEGATIVE   Ketones, ur NEGATIVE NEGATIVE mg/dL   Protein, ur NEGATIVE NEGATIVE mg/dL   Nitrite NEGATIVE NEGATIVE   Leukocytes,Ua NEGATIVE NEGATIVE    Comment: Performed at [facility], [address], [place], [place] [address]  Troponin I (High Sensitivity)     Status: None   Collection Time: 01/02/23 11:49 AM  Result Value Ref Range   Troponin I (High Sensitivity) 13 <18 ng/L    Comment: (NOTE) Elevated high sensitivity troponin I  (hsTnI) values and significant  changes across serial measurements may suggest ACS but many other  chronic and acute conditions are known to elevate hsTnI results.  Refer to the "Links" section for chest pain algorithms and additional  guidance.  Performed at [facility], [address] [place], [place] [address]    VAS [place] LOWER EXTREMITY VENOUS (DVT) (7a-7p)  Result Date: 01/02/2023  Lower Venous DVT Study Patient Name:  April Douglas  Date of Exam:   01/02/2023 Medical Rec #: [id number]      Accession #:    [id number] Date of Birth: Jul 19, 1933     Patient Gender: F Patient Age:   10 years Exam Location:  [facility] Procedure:      VAS [place] LOWER EXTREMITY VENOUS (DVT) Referring Phys: Lelon Mast PETRUCELLI --------------------------------------------------------------------------------  Indications: Pulmonary embolism. Other Indications: Palpitations and disoriented. Comparison Study: No priors. Performing Technologist: Marilynne Halsted RDMS, RVT  Examination Guidelines: A complete evaluation includes B-mode imaging, spectral Doppler, color Doppler, and power Doppler as needed of all accessible portions of each vessel. Bilateral testing is considered an integral part of a complete examination. Limited examinations for reoccurring indications may be performed as noted. The reflux portion of the exam is performed with the patient in reverse Trendelenburg.  +---------+---------------+---------+-----------+----------+--------------+ RIGHT    CompressibilityPhasicitySpontaneityPropertiesThrombus Aging +---------+---------------+---------+-----------+----------+--------------+ CFV      Full           Yes      Yes                                 +---------+---------------+---------+-----------+----------+--------------+ SFJ      Full                                                        +---------+---------------+---------+-----------+----------+--------------+ FV Prox  Full                                                         +---------+---------------+---------+-----------+----------+--------------+ FV Mid   Full                                                        +---------+---------------+---------+-----------+----------+--------------+ FV DistalFull                                                        +---------+---------------+---------+-----------+----------+--------------+ PFV      Full                                                        +---------+---------------+---------+-----------+----------+--------------+ POP      Full           Yes      Yes                                 +---------+---------------+---------+-----------+----------+--------------+  PTV      Full                                                        +---------+---------------+---------+-----------+----------+--------------+ PERO     Full                                                        +---------+---------------+---------+-----------+----------+--------------+   +---------+---------------+---------+-----------+----------+--------------+ LEFT     CompressibilityPhasicitySpontaneityPropertiesThrombus Aging +---------+---------------+---------+-----------+----------+--------------+ CFV      Full           Yes      Yes                                 +---------+---------------+---------+-----------+----------+--------------+ SFJ      Full                                                        +---------+---------------+---------+-----------+----------+--------------+ FV Prox  Full                                                        +---------+---------------+---------+-----------+----------+--------------+ FV Mid   Full                                                        +---------+---------------+---------+-----------+----------+--------------+ FV DistalFull                                                         +---------+---------------+---------+-----------+----------+--------------+ PFV      Full                                                        +---------+---------------+---------+-----------+----------+--------------+ POP      Full           Yes      Yes                                 +---------+---------------+---------+-----------+----------+--------------+ PTV      Full                                                        +---------+---------------+---------+-----------+----------+--------------+  PERO     Full                                                        +---------+---------------+---------+-----------+----------+--------------+     Summary: BILATERAL: - No evidence of deep vein thrombosis seen in the lower extremities, bilaterally. -No evidence of popliteal cyst, bilaterally.   *See table(s) above for measurements and observations. Electronically signed by Heath Lark on 01/02/2023 at 3:54:29 PM.    Final    CT Angio Chest PE W/Cm &/Or Wo Cm  Result Date: 01/02/2023 CLINICAL DATA:  Shortness of breath EXAM: CT ANGIOGRAPHY CHEST WITH CONTRAST TECHNIQUE: Multidetector CT imaging of the chest was performed using the standard protocol during bolus administration of intravenous contrast. Multiplanar CT image reconstructions and MIPs were obtained to evaluate the vascular anatomy. RADIATION DOSE REDUCTION: This exam was performed according to the departmental dose-optimization program which includes automated exposure control, adjustment of the mA and/or kV according to patient size and/or use of iterative reconstruction technique. CONTRAST:  60mL OMNIPAQUE IOHEXOL 350 MG/ML SOLN COMPARISON:  Same day chest radiograph FINDINGS: Cardiovascular: There is adequate opacification of the pulmonary arteries to the segmental level. There is linear web-like nonocclusive filling defect at the bifurcation of the left upper lobe segmental pulmonary artery ([id number]). No  other pulmonary emboli are seen. The heart size is normal. There is no evidence of right heart strain. There is no pericardial effusion. The thoracic aorta is unremarkable. Mediastinum/Nodes: The thyroid is grossly unremarkable. The esophagus is grossly unremarkable. There is no mediastinal, hilar, or axillary lymphadenopathy. Lungs/Pleura: The trachea and central airways are patent. The lungs well inflated. There is no focal consolidation or pulmonary edema. There is no pleural effusion or pneumothorax. A calcified granuloma is again noted in the right upper lobe. There are no suspicious nodules. Upper Abdomen: Multiple renal and hepatic cysts are noted requiring no specific imaging follow-up. Musculoskeletal: There is age-indeterminate but chronic appearing compression deformity of the T11 and T12 vertebral bodies with up to approximately 50% loss of vertebral body height but no bony retropulsion. Review of the MIP images confirms the above findings. IMPRESSION: 1. Linear web-like nonocclusive filling defect at the bifurcation of the left upper lobe segmental pulmonary artery consistent with pulmonary embolus, possibly chronic. No evidence of right heart strain or other pulmonary emboli. 2. No focal consolidation or pulmonary edema. 3. Age-indeterminate but chronic appearing compression deformities of the T11 and T12 vertebral bodies with up to approximately 50% loss of vertebral body height but no bony retropulsion. These results were called by telephone at the time of interpretation on 01/02/2023 at 12:57 pm to provider Dr Rodena Medin, who verbally acknowledged these results. Electronically Signed   By: Lesia Hausen M.D.   On: 01/02/2023 12:57   DG Chest Portable 1 View  Result Date: 01/02/2023 CLINICAL DATA:  Dyspnea, palpitations EXAM: PORTABLE CHEST 1 VIEW COMPARISON:  Same-day CT chest, chest radiograph 11/04/2019 FINDINGS: The cardiomediastinal silhouette is stable. A loop recorder is noted There is no  focal consolidation or pulmonary edema. There is no pleural effusion or pneumothorax. A calcified granuloma in the right upper lobe is unchanged. There is no acute osseous abnormality. IMPRESSION: No radiographic evidence of acute cardiopulmonary process. Electronically Signed   By: Lesia Hausen M.D.   On: 01/02/2023 12:38   CT  Head Wo Contrast  Result Date: 01/02/2023 CLINICAL DATA:  Mental status change with unknown cause. EXAM: CT HEAD WITHOUT CONTRAST TECHNIQUE: Contiguous axial images were obtained from the base of the skull through the vertex without intravenous contrast. RADIATION DOSE REDUCTION: This exam was performed according to the departmental dose-optimization program which includes automated exposure control, adjustment of the mA and/or kV according to patient size and/or use of iterative reconstruction technique. COMPARISON:  None Available. FINDINGS: Brain: No evidence of acute infarction, hemorrhage, obstructive hydrocephalus, extra-axial collection or mass lesion/mass effect. Disproportionate subarachnoid spaces. Calcification along the inner table of the left frontal convexity measuring 16 mm, somewhat heterogeneous on axial images but fairly defined with corticomedullary differentiation on coronal reformats. No edema in the adjacent brain. Vascular: No hyperdense vessel or unexpected calcification. Skull: Normal. Negative for fracture or focal lesion. Sinuses/Orbits: No acute finding. IMPRESSION: 1. No acute finding. 2. Ventriculomegaly with disproportionate subarachnoid spaces, correlate for symptoms of NPH. 3. Calcified meningioma or osteoma along the left frontal convexity, only 16 mm. Electronically Signed   By: Tiburcio Pea M.D.   On: 01/02/2023 11:44    Pending Labs Unresulted Labs (From admission, onward)     Start     Ordered   01/03/23 0500  CBC  Tomorrow morning,   R        01/02/23 1444   01/03/23 0500  Basic metabolic panel  Tomorrow morning,   R        01/02/23 1444    01/02/23 2200  Heparin level (unfractionated)  Once-Timed,   URGENT        01/02/23 1348            Vitals/Pain Today's Vitals   01/02/23 1300 01/02/23 1347 01/02/23 1415 01/02/23 1445  BP: (!) 164/85  (!) 156/81 (!) 163/98  Pulse: 80  71 70  Resp: 14  15 (!) 21  Temp:  98.1 F (36.7 C)    TempSrc:  Oral    SpO2: 100%  99% 100%  Weight: 67.1 kg     PainSc:        Isolation Precautions No active isolations  Medications Medications  heparin ADULT infusion 100 units/mL (25000 units/272mL) (1,050 Units/hr Intravenous New Bag/Given 01/02/23 1356)  fluticasone (FLONASE) 50 MCG/ACT nasal spray 2 spray (has no administration in time range)  loratadine (CLARITIN) tablet 10 mg (has no administration in time range)  memantine (NAMENDA) tablet 10 mg (has no administration in time range)  sodium chloride flush (NS) 0.9 % injection 3 mL (has no administration in time range)  carboxymethylcellulose (REFRESH PLUS) 0.5 % ophthalmic solution 1 drop (has no administration in time range)  cycloSPORINE (RESTASIS) 0.05 % ophthalmic emulsion 1 drop (has no administration in time range)  Menthol (Topical Analgesic) 4 % GEL 1 Application (has no administration in time range)  irbesartan (AVAPRO) tablet 300 mg (has no administration in time range)  hydrALAZINE (APRESOLINE) tablet 100 mg (has no administration in time range)  amLODipine (NORVASC) tablet 2.5 mg (has no administration in time range)  acetaminophen (TYLENOL) tablet 650 mg (has no administration in time range)    Or  acetaminophen (TYLENOL) suppository 650 mg (has no administration in time range)  ondansetron (ZOFRAN) injection 4 mg (has no administration in time range)  iohexol (OMNIPAQUE) 350 MG/ML injection 60 mL (60 mLs Intravenous Contrast Given 01/02/23 1215)  heparin bolus via infusion 4,000 Units (4,000 Units Intravenous Bolus from Bag 01/02/23 1356)    Mobility walks  Focused Assessments See  chart   R Recommendations: See Admitting Provider Note  Report given to:   Additional Notes: see chart

## 2023-01-03 ENCOUNTER — Ambulatory Visit: Payer: Medicare Other | Admitting: Cardiology

## 2023-01-03 ENCOUNTER — Observation Stay (HOSPITAL_COMMUNITY): Payer: Medicare Other

## 2023-01-03 ENCOUNTER — Other Ambulatory Visit (HOSPITAL_COMMUNITY): Payer: Self-pay

## 2023-01-03 DIAGNOSIS — I2699 Other pulmonary embolism without acute cor pulmonale: Secondary | ICD-10-CM | POA: Diagnosis not present

## 2023-01-03 DIAGNOSIS — G912 (Idiopathic) normal pressure hydrocephalus: Secondary | ICD-10-CM | POA: Diagnosis not present

## 2023-01-03 DIAGNOSIS — I10 Essential (primary) hypertension: Secondary | ICD-10-CM | POA: Diagnosis not present

## 2023-01-03 DIAGNOSIS — I2693 Single subsegmental pulmonary embolism without acute cor pulmonale: Secondary | ICD-10-CM

## 2023-01-03 DIAGNOSIS — Z87898 Personal history of other specified conditions: Secondary | ICD-10-CM

## 2023-01-03 DIAGNOSIS — R413 Other amnesia: Secondary | ICD-10-CM | POA: Diagnosis not present

## 2023-01-03 DIAGNOSIS — R269 Unspecified abnormalities of gait and mobility: Secondary | ICD-10-CM | POA: Diagnosis not present

## 2023-01-03 DIAGNOSIS — R002 Palpitations: Secondary | ICD-10-CM | POA: Diagnosis not present

## 2023-01-03 LAB — ECHOCARDIOGRAM COMPLETE
Area-P 1/2: 3.99 cm2
Height: 65.5 in
P 1/2 time: 680 ms
S' Lateral: 2.2 cm
Weight: 2278.67 [oz_av]

## 2023-01-03 LAB — BASIC METABOLIC PANEL
Anion gap: 9 (ref 5–15)
BUN: 16 mg/dL (ref 8–23)
CO2: 26 mmol/L (ref 22–32)
Calcium: 9.3 mg/dL (ref 8.9–10.3)
Chloride: 99 mmol/L (ref 98–111)
Creatinine, Ser: 0.87 mg/dL (ref 0.44–1.00)
GFR, Estimated: >60 mL/min (ref >60)
Glucose, Bld: 88 mg/dL (ref 70–99)
Potassium: 4.2 mmol/L (ref 3.5–5.1)
Sodium: 134 mmol/L — ABNORMAL LOW (ref 135–145)

## 2023-01-03 LAB — CBC
HCT: 39 % (ref 36.0–46.0)
Hemoglobin: 12.9 g/dL (ref 12.0–15.0)
MCH: 31.7 pg (ref 26.0–34.0)
MCHC: 33.1 g/dL (ref 30.0–36.0)
MCV: 95.8 fL (ref 80.0–100.0)
Platelets: 223 10*3/uL (ref 150–400)
RBC: 4.07 MIL/uL (ref 3.87–5.11)
RDW: 12 % (ref 11.5–15.5)
WBC: 5.3 10*3/uL (ref 4.0–10.5)
nRBC: 0 % (ref 0.0–0.2)

## 2023-01-03 LAB — HEPARIN LEVEL (UNFRACTIONATED): Heparin Unfractionated: 0.73 [IU]/mL — ABNORMAL HIGH (ref 0.30–0.70)

## 2023-01-03 MED ORDER — APIXABAN 5 MG PO TABS
10.0000 mg | ORAL_TABLET | Freq: Two times a day (BID) | ORAL | Status: DC
  Administered 2023-01-03: 10 mg via ORAL
  Filled 2023-01-03: qty 2

## 2023-01-03 MED ORDER — APIXABAN (ELIQUIS) VTE STARTER PACK (10MG AND 5MG)
ORAL_TABLET | ORAL | 0 refills | Status: DC
Start: 2023-01-03 — End: 2023-03-10
  Filled 2023-01-03: qty 74, 30d supply, fill #0

## 2023-01-03 MED ORDER — APIXABAN 5 MG PO TABS: 5.0000 mg | ORAL_TABLET | Freq: Two times a day (BID) | ORAL | Status: DC

## 2023-01-03 NOTE — Plan of Care (Signed)
  Problem: Education: Goal: Understanding of post-operative needs will improve Outcome: Adequate for Discharge Goal: Individualized Educational Video(s) Outcome: Adequate for Discharge   Problem: Clinical Measurements: Goal: Postoperative complications will be avoided or minimized Outcome: Adequate for Discharge   Problem: Respiratory: Goal: Will regain and/or maintain adequate ventilation Outcome: Adequate for Discharge   Problem: Education: Goal: Knowledge of General Education information will improve Description: Including pain rating scale, medication(s)/side effects and non-pharmacologic comfort measures Outcome: Adequate for Discharge   Problem: Health Behavior/Discharge Planning: Goal: Ability to manage health-related needs will improve Outcome: Adequate for Discharge   Problem: Clinical Measurements: Goal: Ability to maintain clinical measurements within normal limits will improve Outcome: Adequate for Discharge Goal: Will remain free from infection Outcome: Adequate for Discharge Goal: Diagnostic test results will improve Outcome: Adequate for Discharge Goal: Respiratory complications will improve Outcome: Adequate for Discharge Goal: Cardiovascular complication will be avoided Outcome: Adequate for Discharge   Problem: Activity: Goal: Risk for activity intolerance will decrease Outcome: Adequate for Discharge   Problem: Nutrition: Goal: Adequate nutrition will be maintained Outcome: Adequate for Discharge   Problem: Coping: Goal: Level of anxiety will decrease Outcome: Adequate for Discharge   Problem: Elimination: Goal: Will not experience complications related to bowel motility Outcome: Adequate for Discharge Goal: Will not experience complications related to urinary retention Outcome: Adequate for Discharge   Problem: Pain Managment: Goal: General experience of comfort will improve Outcome: Adequate for Discharge   Problem: Safety: Goal: Ability  to remain free from injury will improve Outcome: Adequate for Discharge   Problem: Skin Integrity: Goal: Risk for impaired skin integrity will decrease Outcome: Adequate for Discharge

## 2023-01-03 NOTE — TOC Benefit Eligibility Note (Signed)
Patient Product/process development scientist completed.    The patient is insured through [facility]. Patient has [employer], may use a copay card, and/or apply for patient assistance if available.    Ran test claim for Eliquis Starter pack and the current 30 day co-pay is $47.00.   This test claim was processed through [facility]- copay amounts may vary at other pharmacies due to pharmacy/plan contracts, or as the patient moves through the different stages of their insurance plan.     Roland Earl, CPHT Pharmacy Technician III Certified Patient Advocate [facility] Patient Advocate Team Direct Number: [phone]  Fax: [phone]

## 2023-01-03 NOTE — Progress Notes (Signed)
Patient discharged via wheelchair in stable condition.

## 2023-01-03 NOTE — Progress Notes (Signed)
PHARMACY - ANTICOAGULATION CONSULT NOTE  Pharmacy Consult for apixaban Indication: pulmonary embolus  No Known Allergies  Patient Measurements: Height: 5' 5.5" (166.4 cm) Weight: 64.6 kg (142 lb 6.7 oz) IBW/kg (Calculated) : 58.15  Vital Signs: Temp: 98.8 F (37.1 C) (10/18 0457) Temp Source: Oral (10/18 0457) BP: 117/67 (10/18 0731) Pulse Rate: 76 (10/18 0731)  Labs: Recent Labs    01/02/23 0934 01/02/23 1004 01/02/23 1149 01/02/23 2052 01/03/23 1015  HGB 15.0  --   --   --  12.9  HCT 44.3  --   --   --  39.0  PLT 248  --   --   --  223  APTT 28  --   --   --   --   LABPROT 13.3  --   --   --   --   INR 1.0  --   --   --   --   HEPARINUNFRC  --   --   --  0.53  --   CREATININE 0.99 1.00  --   --   --   TROPONINIHS 10  --  13  --   --     Estimated Creatinine Clearance: 37.1 mL/min (by C-G formula based on SCr of 1 mg/dL).   Medical History: Past Medical History:  Diagnosis Date   Dehydration    Dizziness    Elevated serum creatinine    Encounter for loop recorder check 06/27/2019   Glaucoma    Hyperlipidemia    Hypertension    Loop recorder Biotronik 05/21/19 06/08/2019   Right bundle branch block    Seasonal affective disorder (HCC)    Syncope and collapse 10/23/2018    Medications:  Medications Prior to Admission  Medication Sig Dispense Refill Last Dose   acetaminophen (TYLENOL) 500 MG tablet Take 2 tablets by mouth every 6 (six) hours as needed for mild pain (pain score 1-3).   Past Week   amLODipine (NORVASC) 2.5 MG tablet TAKE 1 TABLET(2.5 MG) BY MOUTH DAILY (Patient taking differently: Take 1 tablet by mouth daily. Takes at 1400) 90 tablet 3 01/01/2023   CALCIUM CARB-CHOLECALCIFEROL PO Take 1 tablet by mouth daily.   01/01/2023   carboxymethylcellulose (REFRESH PLUS) 0.5 % SOLN Place 1 drop into both eyes 2 (two) times daily as needed (dry eyes).   01/02/2023   cetirizine (ZYRTEC) 10 MG tablet Take 1 tablet by mouth daily as needed for allergies or  rhinitis.   01/01/2023   Cholecalciferol (VITAMIN D3) 50 MCG (2000 UT) CHEW Chew 1 Dose by mouth daily as needed (per instructions).   01/01/2023   cycloSPORINE (RESTASIS) 0.05 % ophthalmic emulsion 1 drop 2 (two) times daily.   01/02/2023   estradiol (ESTRACE) 0.1 MG/GM vaginal cream 1 Applicatorful once a week. Medication is 0.5/gm Takes two times per week   Past Week   fluticasone (FLONASE) 50 MCG/ACT nasal spray Place 2 sprays into both nostrils at bedtime as needed for allergies or rhinitis (Takes at night when taking Zyrtec).   01/01/2023   hydrALAZINE (APRESOLINE) 100 MG tablet Take 1 tablet (100 mg total) by mouth 2 (two) times daily. TAKE 1 TABLET(100 MG) BY MOUTH THREE TIMES DAILY (Patient taking differently: Take 1 tablet by mouth 2 (two) times daily.) 270 tablet 1 01/02/2023   memantine (NAMENDA) 10 MG tablet Take 1 tablet by mouth 2 (two) times daily.   01/02/2023   Menthol, Topical Analgesic, (BIOFREEZE ROLL-ON) 4 % GEL Apply 1 Application topically 2 (  two) times daily as needed (For arthritis pain).   01/01/2023   Multiple Vitamin (MULTIVITAMIN WITH MINERALS) TABS tablet Take 1 tablet by mouth daily as needed (Takes when she remembers).   01/01/2023   olmesartan (BENICAR) 40 MG tablet TAKE 1 TABLET(40 MG) BY MOUTH EVERY EVENING 90 tablet 1 01/01/2023   Omega-3 Fatty Acids (SALMON OIL-1000 PO) Take 1 capsule by mouth daily as needed (as instructed).   01/01/2023   Turmeric (QC TUMERIC COMPLEX) 500 MG CAPS Take 1 capsule by mouth daily as needed (as instructed).   01/01/2023    Assessment: April Douglas is a 89 y.o. female. CT angiogram of the chest consistent with a pulmonary embolism that was thought possibly chronic with no evidence of right heart strain. Was started on heparin. Pharmacy now consulted to transition to apixaban. No anticoagulation prior to admission. CBC within normal limits.  No signs/symptoms of bleeding noted. Bilateral dopplers negative for DVT 10/18.   Goal  of Therapy:  Therapeutic anticoagulation Monitor platelets by anticoagulation protocol: Yes   Plan:  Stop heparin Give apixaban 10 mg PO BID x 7 days, followed by Apixaban 5 mg BID  Monitor for signs and symptoms of bleeding   Thank you for allowing pharmacy to be a part of this patient's care.   Signe Colt, PharmD 01/03/2023 12:41 PM  **Pharmacist phone directory can be found on [email or web address] listed under [facility]**

## 2023-01-03 NOTE — Discharge Instructions (Addendum)
Information on my medicine - ELIQUIS (apixaban)  This medication education was reviewed with me or my healthcare representative as part of my discharge preparation.   Why was Eliquis prescribed for you? Eliquis was prescribed to treat blood clots that may have been found in the veins of your legs (deep vein thrombosis) or in your lungs (pulmonary embolism) and to reduce the risk of them occurring again.  What do You need to know about Eliquis ? The starting dose is 10 mg (two 5 mg tablets) taken TWICE daily for the FIRST SEVEN (7) DAYS, then on 01/10/23  the dose is reduced to ONE 5 mg tablet taken TWICE daily.  Eliquis may be taken with or without food.   Try to take the dose about the same time in the morning and in the evening. If you have difficulty swallowing the tablet whole please discuss with your pharmacist how to take the medication safely.  Take Eliquis exactly as prescribed and DO NOT stop taking Eliquis without talking to the doctor who prescribed the medication.  Stopping may increase your risk of developing a new blood clot.  Refill your prescription before you run out.  After discharge, you should have regular check-up appointments with your healthcare provider that is prescribing your Eliquis.    What do you do if you miss a dose? If a dose of ELIQUIS is not taken at the scheduled time, take it as soon as possible on the same day and twice-daily administration should be resumed. The dose should not be doubled to make up for a missed dose.  Important Safety Information A possible side effect of Eliquis is bleeding. You should call your healthcare provider right away if you experience any of the following: Bleeding from an injury or your nose that does not stop. Unusual colored urine (red or dark brown) or unusual colored stools (red or black). Unusual bruising for unknown reasons. A serious fall or if you hit your head (even if there is no bleeding).  Some  medicines may interact with Eliquis and might increase your risk of bleeding or clotting while on Eliquis. To help avoid this, consult your healthcare provider or pharmacist prior to using any new prescription or non-prescription medications, including herbals, vitamins, non-steroidal anti-inflammatory drugs (NSAIDs) and supplements.  This website has more information on Eliquis (apixaban): [email or web address]://[email or web address].[email or web address]/home

## 2023-01-03 NOTE — Discharge Summary (Signed)
Physician Discharge Summary  April Douglas [id number]:[id number] DOB: [birth year] DOA: 01/02/2023  PCP: Clayborn Heron, MD  Admit date: 01/02/2023 Discharge date: 01/03/2023  Admitted From: Home Disposition: Home  Recommendations for Outpatient Follow-up:  Follow up with PCP in 1-2 weeks Follow-up with neurosurgery as discussed:  Home Health: None Equipment/Devices: None  Discharge Condition: Stable CODE STATUS: Full Diet recommendation: Regular diet as tolerated  Brief/Interim Summary: April Douglas is a 89 y.o. female with medical history significant of hypertension, hyperlipidemia, right bundle branch block, syncope, palpitations s/p loop recorder, and glaucoma who presents with complaints of palpitations with shortness of breath.    Patient admitted as above with acute respiratory distress and palpitations, imaging remarkable for acute versus chronic pulmonary embolism.  DVT study is negative.  Given patient's palpitations and history of syncope and collapse with reported presyncope prior to admission CT head was ordered found to have moderately enlarged lateral ventricles concerning for normal pressure hydrocephalus.  Patient's family indicates she had an abnormal CT in [place] around 2016 or 2017, as such this does not appear to be an acute or new finding, patient does not have profound symptoms concerning for NPH but does have notable memory loss/dementia, urinary urgency/incontinence occasionally as well as mild ambulatory dysfunction at baseline.  Unclear if these are related to abnormal imaging or simply age-related changes.  Initiate patient on Eliquis otherwise stable and agreeable for discharge home, close follow-up with PCP and neurosurgery as above to evaluate and confirm diagnosis of NPH, we did discuss possible need for ventricular drain placement which is complicated by patient's new need for anticoagulation.  Discharge Diagnoses:  Principal Problem:   Pulmonary emboli  (HCC) Active Problems:   Palpitations   History of syncope   Essential hypertension   Memory loss   Normal pressure hydrocephalus (HCC)   Gait disturbance   Acute vs subacute symptomatic pulmonary embolism -Continue Eliquis, DVT study negative, ambulating without further palpitations dyspnea   Palpitations Chronic history of syncope and collapse, no acute episodes Follow-up with cardiology, likely provoked secondary to PE as above but given prior loop recorder placement reevaluation may be prudent per their expertise.   Essential hypertension Stabilizing on home medications   Memory loss, unspecified dementia On Namenda at baseline, possibly related to ?NPH as above   Rule out normal pressure hydrocephalus Follow-up with neurosurgery in the outpatient setting  Weight loss -Unspecified, follow-up with PCP, patient reports "being more strict with my diet" -may benefit from dietitian and nutrition consult   Gait disturbance -Walks with walker outside the house, follow-up with NPH as above although this may simply be weakness given advanced age   Discharge Instructions   Allergies as of 01/03/2023   No Known Allergies      Medication List     TAKE these medications    acetaminophen 500 MG tablet Commonly known as: TYLENOL Take 2 tablets by mouth every 6 (six) hours as needed for mild pain (pain score 1-3).   amLODipine 2.5 MG tablet Commonly known as: NORVASC TAKE 1 TABLET(2.5 MG) BY MOUTH DAILY What changed: See the new instructions.   Apixaban Starter Pack (10mg  and 5mg ) Commonly known as: ELIQUIS STARTER PACK Take as directed on package: start with two-5mg  tablets twice daily for 7 days. On day 8, switch to one-5mg  tablet twice daily.   Biofreeze Roll-On 4 % Gel Generic drug: Menthol (Topical Analgesic) Apply 1 Application topically 2 (two) times daily as needed (For arthritis pain).   CALCIUM CARB-CHOLECALCIFEROL  Physician Discharge Summary  April Douglas [id number]:[id number] DOB: [birth year] DOA: 01/02/2023  PCP: Clayborn Heron, MD  Admit date: 01/02/2023 Discharge date: 01/03/2023  Admitted From: Home Disposition: Home  Recommendations for Outpatient Follow-up:  Follow up with PCP in 1-2 weeks Follow-up with neurosurgery as discussed:  Home Health: None Equipment/Devices: None  Discharge Condition: Stable CODE STATUS: Full Diet recommendation: Regular diet as tolerated  Brief/Interim Summary: April Douglas is a 89 y.o. female with medical history significant of hypertension, hyperlipidemia, right bundle branch block, syncope, palpitations s/p loop recorder, and glaucoma who presents with complaints of palpitations with shortness of breath.    Patient admitted as above with acute respiratory distress and palpitations, imaging remarkable for acute versus chronic pulmonary embolism.  DVT study is negative.  Given patient's palpitations and history of syncope and collapse with reported presyncope prior to admission CT head was ordered found to have moderately enlarged lateral ventricles concerning for normal pressure hydrocephalus.  Patient's family indicates she had an abnormal CT in [place] around 2016 or 2017, as such this does not appear to be an acute or new finding, patient does not have profound symptoms concerning for NPH but does have notable memory loss/dementia, urinary urgency/incontinence occasionally as well as mild ambulatory dysfunction at baseline.  Unclear if these are related to abnormal imaging or simply age-related changes.  Initiate patient on Eliquis otherwise stable and agreeable for discharge home, close follow-up with PCP and neurosurgery as above to evaluate and confirm diagnosis of NPH, we did discuss possible need for ventricular drain placement which is complicated by patient's new need for anticoagulation.  Discharge Diagnoses:  Principal Problem:   Pulmonary emboli  (HCC) Active Problems:   Palpitations   History of syncope   Essential hypertension   Memory loss   Normal pressure hydrocephalus (HCC)   Gait disturbance   Acute vs subacute symptomatic pulmonary embolism -Continue Eliquis, DVT study negative, ambulating without further palpitations dyspnea   Palpitations Chronic history of syncope and collapse, no acute episodes Follow-up with cardiology, likely provoked secondary to PE as above but given prior loop recorder placement reevaluation may be prudent per their expertise.   Essential hypertension Stabilizing on home medications   Memory loss, unspecified dementia On Namenda at baseline, possibly related to ?NPH as above   Rule out normal pressure hydrocephalus Follow-up with neurosurgery in the outpatient setting  Weight loss -Unspecified, follow-up with PCP, patient reports "being more strict with my diet" -may benefit from dietitian and nutrition consult   Gait disturbance -Walks with walker outside the house, follow-up with NPH as above although this may simply be weakness given advanced age   Discharge Instructions   Allergies as of 01/03/2023   No Known Allergies      Medication List     TAKE these medications    acetaminophen 500 MG tablet Commonly known as: TYLENOL Take 2 tablets by mouth every 6 (six) hours as needed for mild pain (pain score 1-3).   amLODipine 2.5 MG tablet Commonly known as: NORVASC TAKE 1 TABLET(2.5 MG) BY MOUTH DAILY What changed: See the new instructions.   Apixaban Starter Pack (10mg  and 5mg ) Commonly known as: ELIQUIS STARTER PACK Take as directed on package: start with two-5mg  tablets twice daily for 7 days. On day 8, switch to one-5mg  tablet twice daily.   Biofreeze Roll-On 4 % Gel Generic drug: Menthol (Topical Analgesic) Apply 1 Application topically 2 (two) times daily as needed (For arthritis pain).   CALCIUM CARB-CHOLECALCIFEROL  Physician Discharge Summary  April Douglas [id number]:[id number] DOB: [birth year] DOA: 01/02/2023  PCP: Clayborn Heron, MD  Admit date: 01/02/2023 Discharge date: 01/03/2023  Admitted From: Home Disposition: Home  Recommendations for Outpatient Follow-up:  Follow up with PCP in 1-2 weeks Follow-up with neurosurgery as discussed:  Home Health: None Equipment/Devices: None  Discharge Condition: Stable CODE STATUS: Full Diet recommendation: Regular diet as tolerated  Brief/Interim Summary: April Douglas is a 89 y.o. female with medical history significant of hypertension, hyperlipidemia, right bundle branch block, syncope, palpitations s/p loop recorder, and glaucoma who presents with complaints of palpitations with shortness of breath.    Patient admitted as above with acute respiratory distress and palpitations, imaging remarkable for acute versus chronic pulmonary embolism.  DVT study is negative.  Given patient's palpitations and history of syncope and collapse with reported presyncope prior to admission CT head was ordered found to have moderately enlarged lateral ventricles concerning for normal pressure hydrocephalus.  Patient's family indicates she had an abnormal CT in [place] around 2016 or 2017, as such this does not appear to be an acute or new finding, patient does not have profound symptoms concerning for NPH but does have notable memory loss/dementia, urinary urgency/incontinence occasionally as well as mild ambulatory dysfunction at baseline.  Unclear if these are related to abnormal imaging or simply age-related changes.  Initiate patient on Eliquis otherwise stable and agreeable for discharge home, close follow-up with PCP and neurosurgery as above to evaluate and confirm diagnosis of NPH, we did discuss possible need for ventricular drain placement which is complicated by patient's new need for anticoagulation.  Discharge Diagnoses:  Principal Problem:   Pulmonary emboli  (HCC) Active Problems:   Palpitations   History of syncope   Essential hypertension   Memory loss   Normal pressure hydrocephalus (HCC)   Gait disturbance   Acute vs subacute symptomatic pulmonary embolism -Continue Eliquis, DVT study negative, ambulating without further palpitations dyspnea   Palpitations Chronic history of syncope and collapse, no acute episodes Follow-up with cardiology, likely provoked secondary to PE as above but given prior loop recorder placement reevaluation may be prudent per their expertise.   Essential hypertension Stabilizing on home medications   Memory loss, unspecified dementia On Namenda at baseline, possibly related to ?NPH as above   Rule out normal pressure hydrocephalus Follow-up with neurosurgery in the outpatient setting  Weight loss -Unspecified, follow-up with PCP, patient reports "being more strict with my diet" -may benefit from dietitian and nutrition consult   Gait disturbance -Walks with walker outside the house, follow-up with NPH as above although this may simply be weakness given advanced age   Discharge Instructions   Allergies as of 01/03/2023   No Known Allergies      Medication List     TAKE these medications    acetaminophen 500 MG tablet Commonly known as: TYLENOL Take 2 tablets by mouth every 6 (six) hours as needed for mild pain (pain score 1-3).   amLODipine 2.5 MG tablet Commonly known as: NORVASC TAKE 1 TABLET(2.5 MG) BY MOUTH DAILY What changed: See the new instructions.   Apixaban Starter Pack (10mg  and 5mg ) Commonly known as: ELIQUIS STARTER PACK Take as directed on package: start with two-5mg  tablets twice daily for 7 days. On day 8, switch to one-5mg  tablet twice daily.   Biofreeze Roll-On 4 % Gel Generic drug: Menthol (Topical Analgesic) Apply 1 Application topically 2 (two) times daily as needed (For arthritis pain).   CALCIUM CARB-CHOLECALCIFEROL  PO Take 1 tablet by mouth daily.    carboxymethylcellulose 0.5 % Soln Commonly known as: REFRESH PLUS Place 1 drop into both eyes 2 (two) times daily as needed (dry eyes).   cetirizine 10 MG tablet Commonly known as: ZYRTEC Take 1 tablet by mouth daily as needed for allergies or rhinitis.   cycloSPORINE 0.05 % ophthalmic emulsion Commonly known as: RESTASIS 1 drop 2 (two) times daily.   estradiol 0.1 MG/GM vaginal cream Commonly known as: ESTRACE 1 Applicatorful once a week. Medication is 0.5/gm Takes two times per week   fluticasone 50 MCG/ACT nasal spray Commonly known as: FLONASE Place 2 sprays into both nostrils at bedtime as needed for allergies or rhinitis (Takes at night when taking Zyrtec).   hydrALAZINE 100 MG tablet Commonly known as: APRESOLINE Take 1 tablet (100 mg total) by mouth 2 (two) times daily. TAKE 1 TABLET(100 MG) BY MOUTH THREE TIMES DAILY What changed: additional instructions   memantine 10 MG tablet Commonly known as: NAMENDA Take 1 tablet by mouth 2 (two) times daily.   multivitamin with minerals Tabs tablet Take 1 tablet by mouth daily as needed (Takes when she remembers).   olmesartan 40 MG tablet Commonly known as: BENICAR TAKE 1 TABLET(40 MG) BY MOUTH EVERY EVENING   QC Tumeric Complex 500 MG Caps Generic drug: Turmeric Take 1 capsule by mouth daily as needed (as instructed).   SALMON OIL-1000 PO Take 1 capsule by mouth daily as needed (as instructed).   Vitamin D3 50 MCG (2000 UT) Chew Chew 1 Dose by mouth daily as needed (per instructions).        Follow-up Information     Rankins, Fanny Dance, MD. Go on 01/13/2023.   Specialty: Family Medicine Why: @1 :30pm please arrive @ 1:15 Contact information: [address] [place] [address] [phone]                No Known Allergies  Consultations: None  Procedures/Studies: VAS [place] LOWER EXTREMITY VENOUS (DVT) (7a-7p)  Result Date: 01/02/2023  Lower Venous DVT Study Patient Name:  SULEMA SVETLIK  Date of Exam:   01/02/2023 Medical Rec #: [id number]      Accession #:    [id number] Date of Birth: 05-Apr-1933     Patient Gender: F Patient Age:   19 years Exam Location:  [facility] Procedure:      VAS [place] LOWER EXTREMITY VENOUS (DVT) Referring Phys: Lelon Mast PETRUCELLI --------------------------------------------------------------------------------  Indications: Pulmonary embolism. Other Indications: Palpitations and disoriented. Comparison Study: No priors. Performing Technologist: Marilynne Halsted RDMS, RVT  Examination Guidelines: A complete evaluation includes B-mode imaging, spectral Doppler, color Doppler, and power Doppler as needed of all accessible portions of each vessel. Bilateral testing is considered an integral part of a complete examination. Limited examinations for reoccurring indications may be performed as noted. The reflux portion of the exam is performed with the patient in reverse Trendelenburg.  +---------+---------------+---------+-----------+----------+--------------+ RIGHT    CompressibilityPhasicitySpontaneityPropertiesThrombus Aging +---------+---------------+---------+-----------+----------+--------------+ CFV      Full           Yes      Yes                                 +---------+---------------+---------+-----------+----------+--------------+ SFJ      Full                                                        +---------+---------------+---------+-----------+----------+--------------+  PO Take 1 tablet by mouth daily.    carboxymethylcellulose 0.5 % Soln Commonly known as: REFRESH PLUS Place 1 drop into both eyes 2 (two) times daily as needed (dry eyes).   cetirizine 10 MG tablet Commonly known as: ZYRTEC Take 1 tablet by mouth daily as needed for allergies or rhinitis.   cycloSPORINE 0.05 % ophthalmic emulsion Commonly known as: RESTASIS 1 drop 2 (two) times daily.   estradiol 0.1 MG/GM vaginal cream Commonly known as: ESTRACE 1 Applicatorful once a week. Medication is 0.5/gm Takes two times per week   fluticasone 50 MCG/ACT nasal spray Commonly known as: FLONASE Place 2 sprays into both nostrils at bedtime as needed for allergies or rhinitis (Takes at night when taking Zyrtec).   hydrALAZINE 100 MG tablet Commonly known as: APRESOLINE Take 1 tablet (100 mg total) by mouth 2 (two) times daily. TAKE 1 TABLET(100 MG) BY MOUTH THREE TIMES DAILY What changed: additional instructions   memantine 10 MG tablet Commonly known as: NAMENDA Take 1 tablet by mouth 2 (two) times daily.   multivitamin with minerals Tabs tablet Take 1 tablet by mouth daily as needed (Takes when she remembers).   olmesartan 40 MG tablet Commonly known as: BENICAR TAKE 1 TABLET(40 MG) BY MOUTH EVERY EVENING   QC Tumeric Complex 500 MG Caps Generic drug: Turmeric Take 1 capsule by mouth daily as needed (as instructed).   SALMON OIL-1000 PO Take 1 capsule by mouth daily as needed (as instructed).   Vitamin D3 50 MCG (2000 UT) Chew Chew 1 Dose by mouth daily as needed (per instructions).        Follow-up Information     Rankins, Fanny Dance, MD. Go on 01/13/2023.   Specialty: Family Medicine Why: @1 :30pm please arrive @ 1:15 Contact information: [address] [place] [address] [phone]                No Known Allergies  Consultations: None  Procedures/Studies: VAS [place] LOWER EXTREMITY VENOUS (DVT) (7a-7p)  Result Date: 01/02/2023  Lower Venous DVT Study Patient Name:  SULEMA SVETLIK  Date of Exam:   01/02/2023 Medical Rec #: [id number]      Accession #:    [id number] Date of Birth: 05-Apr-1933     Patient Gender: F Patient Age:   19 years Exam Location:  [facility] Procedure:      VAS [place] LOWER EXTREMITY VENOUS (DVT) Referring Phys: Lelon Mast PETRUCELLI --------------------------------------------------------------------------------  Indications: Pulmonary embolism. Other Indications: Palpitations and disoriented. Comparison Study: No priors. Performing Technologist: Marilynne Halsted RDMS, RVT  Examination Guidelines: A complete evaluation includes B-mode imaging, spectral Doppler, color Doppler, and power Doppler as needed of all accessible portions of each vessel. Bilateral testing is considered an integral part of a complete examination. Limited examinations for reoccurring indications may be performed as noted. The reflux portion of the exam is performed with the patient in reverse Trendelenburg.  +---------+---------------+---------+-----------+----------+--------------+ RIGHT    CompressibilityPhasicitySpontaneityPropertiesThrombus Aging +---------+---------------+---------+-----------+----------+--------------+ CFV      Full           Yes      Yes                                 +---------+---------------+---------+-----------+----------+--------------+ SFJ      Full                                                        +---------+---------------+---------+-----------+----------+--------------+  PO Take 1 tablet by mouth daily.    carboxymethylcellulose 0.5 % Soln Commonly known as: REFRESH PLUS Place 1 drop into both eyes 2 (two) times daily as needed (dry eyes).   cetirizine 10 MG tablet Commonly known as: ZYRTEC Take 1 tablet by mouth daily as needed for allergies or rhinitis.   cycloSPORINE 0.05 % ophthalmic emulsion Commonly known as: RESTASIS 1 drop 2 (two) times daily.   estradiol 0.1 MG/GM vaginal cream Commonly known as: ESTRACE 1 Applicatorful once a week. Medication is 0.5/gm Takes two times per week   fluticasone 50 MCG/ACT nasal spray Commonly known as: FLONASE Place 2 sprays into both nostrils at bedtime as needed for allergies or rhinitis (Takes at night when taking Zyrtec).   hydrALAZINE 100 MG tablet Commonly known as: APRESOLINE Take 1 tablet (100 mg total) by mouth 2 (two) times daily. TAKE 1 TABLET(100 MG) BY MOUTH THREE TIMES DAILY What changed: additional instructions   memantine 10 MG tablet Commonly known as: NAMENDA Take 1 tablet by mouth 2 (two) times daily.   multivitamin with minerals Tabs tablet Take 1 tablet by mouth daily as needed (Takes when she remembers).   olmesartan 40 MG tablet Commonly known as: BENICAR TAKE 1 TABLET(40 MG) BY MOUTH EVERY EVENING   QC Tumeric Complex 500 MG Caps Generic drug: Turmeric Take 1 capsule by mouth daily as needed (as instructed).   SALMON OIL-1000 PO Take 1 capsule by mouth daily as needed (as instructed).   Vitamin D3 50 MCG (2000 UT) Chew Chew 1 Dose by mouth daily as needed (per instructions).        Follow-up Information     Rankins, Fanny Dance, MD. Go on 01/13/2023.   Specialty: Family Medicine Why: @1 :30pm please arrive @ 1:15 Contact information: [address] [place] [address] [phone]                No Known Allergies  Consultations: None  Procedures/Studies: VAS [place] LOWER EXTREMITY VENOUS (DVT) (7a-7p)  Result Date: 01/02/2023  Lower Venous DVT Study Patient Name:  SULEMA SVETLIK  Date of Exam:   01/02/2023 Medical Rec #: [id number]      Accession #:    [id number] Date of Birth: 05-Apr-1933     Patient Gender: F Patient Age:   19 years Exam Location:  [facility] Procedure:      VAS [place] LOWER EXTREMITY VENOUS (DVT) Referring Phys: Lelon Mast PETRUCELLI --------------------------------------------------------------------------------  Indications: Pulmonary embolism. Other Indications: Palpitations and disoriented. Comparison Study: No priors. Performing Technologist: Marilynne Halsted RDMS, RVT  Examination Guidelines: A complete evaluation includes B-mode imaging, spectral Doppler, color Doppler, and power Doppler as needed of all accessible portions of each vessel. Bilateral testing is considered an integral part of a complete examination. Limited examinations for reoccurring indications may be performed as noted. The reflux portion of the exam is performed with the patient in reverse Trendelenburg.  +---------+---------------+---------+-----------+----------+--------------+ RIGHT    CompressibilityPhasicitySpontaneityPropertiesThrombus Aging +---------+---------------+---------+-----------+----------+--------------+ CFV      Full           Yes      Yes                                 +---------+---------------+---------+-----------+----------+--------------+ SFJ      Full                                                        +---------+---------------+---------+-----------+----------+--------------+

## 2023-01-03 NOTE — TOC Transition Note (Signed)
Transition of Care [facility]) - CM/SW Discharge Note   Patient Details  Name: DIJANA PRATO MRN: [id number] Date of Birth: 28-Jan-1934  [facility]) CM/SW Contact:  Leone Haven, RN Phone Number: 01/03/2023, 2:16 PM   Clinical Narrative:    For dc today, will be on eliquis.  Copay for eliquis is 47.00.  TOC to fill the first 30 days free with coupon.    Final next level of care: Home/Self Care Barriers to Discharge: No Barriers Identified   Patient Goals and CMS Choice   Choice offered to / list presented to : NA  Discharge Placement                         Discharge Plan and Services Additional resources added to the After Visit Summary for   In-house Referral: NA Discharge Planning Services: CM Consult Post Acute Care Choice: NA          DME Arranged: N/A DME Agency: NA       HH Arranged: NA          Social Determinants of Health (SDOH) Interventions SDOH Screenings   Food Insecurity: No Food Insecurity (01/02/2023)  Housing: Low Risk  (01/02/2023)  Transportation Needs: No Transportation Needs (01/02/2023)  Utilities: Not At Risk (01/02/2023)  Tobacco Use: Low Risk  (01/02/2023)     Readmission Risk Interventions     No data to display

## 2023-01-03 NOTE — TOC Initial Note (Signed)
Transition of Care [facility]) - Initial/Assessment Note    Patient Details  Name: April Douglas MRN: [id number] Date of Birth: [birth year]  Transition of Care [facility]) CM/SW Contact:    Leone Haven, RN Phone Number: 01/03/2023, 2:15 PM  Clinical Narrative:                 From home with spouse, has PCP and insurance on file, states has no HH services in place at this time , has quad cane at bedside.  States family member will transport them home at [employer] and family is support system, states gets medications from  [facility] [address] and [employer] [facility].  Pta self ambulatory .  Expected Discharge Plan: Home/Self Care Barriers to Discharge: No Barriers Identified   Patient Goals and CMS Choice Patient states their goals for this hospitalization and ongoing recovery are:: return home   Choice offered to / list presented to : NA      Expected Discharge Plan and Services In-house Referral: NA Discharge Planning Services: CM Consult Post Acute Care Choice: NA Living arrangements for the past 2 months: Single Family Home Expected Discharge Date: 01/03/23               DME Arranged: N/A DME Agency: NA       HH Arranged: NA          Prior Living Arrangements/Services Living arrangements for the past 2 months: Single Family Home Lives with:: Spouse Patient language and need for interpreter reviewed:: Yes Do you feel safe going back to the place where you live?: Yes      Need for Family Participation in Patient Care: Yes (Comment) Care giver support system in place?: Yes (comment)   Criminal Activity/Legal Involvement Pertinent to Current Situation/Hospitalization: No - Comment as needed  Activities of Daily Living   ADL Screening (condition at time of admission) Independently performs ADLs?: Yes (appropriate for developmental age) Is the patient deaf or have difficulty hearing?: No Does the patient have difficulty seeing, even when wearing glasses/contacts?: No Does  the patient have difficulty concentrating, remembering, or making decisions?: No  Permission Sought/Granted Permission sought to share information with : Case Manager Permission granted to share information with : Yes, Verbal Permission Granted              Emotional Assessment Appearance:: Appears stated age Attitude/Demeanor/Rapport: Engaged Affect (typically observed): Appropriate Orientation: : Oriented to Place, Oriented to Self, Oriented to  Time, Oriented to Situation Alcohol / Substance Use: Not Applicable Psych Involvement: No (comment)  Admission diagnosis:  Palpitations [R00.2] Pulmonary embolism without acute cor pulmonale, unspecified chronicity, unspecified pulmonary embolism type (HCC) [I26.99] Patient Active Problem List   Diagnosis Date Noted   Pulmonary emboli (HCC) 01/02/2023   Memory loss 01/02/2023   Gait disturbance 01/02/2023   History of syncope 01/02/2023   Normal pressure hydrocephalus (HCC) 01/02/2023   Encounter for loop recorder check 06/27/2019   Loop recorder Biotronik 05/21/19 06/08/2019   Hyponatremia 12/26/2018   Palpitations 10/23/2018   Syncope and collapse 10/23/2018   Essential hypertension 06/22/2018   PCP:  Clayborn Heron, MD Pharmacy:   [facility] [phone] [place], [place] - [address] AT [facility] [address] & [facility] [address] [place] [address] Phone: [phone] Fax: [phone]  [facility] [phone] - [place], [place] - [address] AT [facility] [address] [place] [address] Phone: [phone] Fax: [phone]  [id number] -  [place], [place] - [address] AT [facility] OF [address] [place] [address] Phone: [phone] Fax: [phone]  [facility] [address] [place] [address] Phone: [phone] Fax:  [phone]     Social Determinants of Health (SDOH) Social History: SDOH Screenings   Food Insecurity: No Food Insecurity (01/02/2023)  Housing: Low Risk  (01/02/2023)  Transportation Needs: No Transportation Needs (01/02/2023)  Utilities: Not At Risk (01/02/2023)  Tobacco Use: Low Risk  (01/02/2023)   SDOH Interventions:     Readmission Risk Interventions     No data to display

## 2023-01-03 NOTE — Progress Notes (Signed)
  Echocardiogram 2D Echocardiogram has been performed.  Delcie Roch 01/03/2023, 4:19 PM

## 2023-01-03 NOTE — Consult Note (Signed)
[facility]  [facility] Inpatient Consult   01/03/2023  April Douglas [birth year] [id number]  [facility] [[facility]]  Accountable Care Organization [ACO] Patient: Medicare ACO REACH  Primary Care Provider: Clayborn Heron, MD,[retired] [name], is current PCP, FNP [name] this provider is listed for the transition of care follow up appointments and calls   [facility] Liaison met patient at bedside at [facility]. Patient, husband, and daughter at bedside.  Explained reason for rounding visit to verify PCP and for follow up needs.  Patient is currently in observation status at the time of this review.  The patient was assessed for potential [facility]) Care Management service needs for post hospital transition for care coordination. Review of patient's electronic medical record reveals patient is for home and no additional care coordination needs assessed.   Plan:   Referral request for community care coordination: Patient to be followed by [name] PCP transition office and encourage to call MD office for any needs for after hours as well.   Community Care Management/Population Health does not replace or interfere with any arrangements made by the Inpatient Transition of Care team.   For questions contact:   Charlesetta Shanks, RN, BSN, CCM [place]  [facility] Liaison Direct Dial: [phone] or secure chat Website: [name]

## 2023-01-04 NOTE — Plan of Care (Signed)
[facility] Tonsillectomy/Adenoidectomy, Postoperative PEDS care plan entered in error.

## 2023-01-06 ENCOUNTER — Emergency Department (HOSPITAL_COMMUNITY)
Admission: EM | Admit: 2023-01-06 | Discharge: 2023-01-07 | Disposition: A | Discharge status: Home / Self Care | Payer: Medicare Other | Attending: Emergency Medicine | Admitting: Emergency Medicine

## 2023-01-06 ENCOUNTER — Emergency Department (HOSPITAL_COMMUNITY): Payer: Medicare Other

## 2023-01-06 ENCOUNTER — Encounter (HOSPITAL_COMMUNITY): Payer: Self-pay

## 2023-01-06 DIAGNOSIS — R002 Palpitations: Secondary | ICD-10-CM

## 2023-01-06 DIAGNOSIS — I493 Ventricular premature depolarization: Secondary | ICD-10-CM | POA: Diagnosis not present

## 2023-01-06 DIAGNOSIS — R0602 Shortness of breath: Secondary | ICD-10-CM | POA: Diagnosis not present

## 2023-01-06 DIAGNOSIS — I10 Essential (primary) hypertension: Secondary | ICD-10-CM | POA: Insufficient documentation

## 2023-01-06 DIAGNOSIS — Z79899 Other long term (current) drug therapy: Secondary | ICD-10-CM | POA: Diagnosis not present

## 2023-01-06 HISTORY — DX: Other pulmonary embolism without acute cor pulmonale: I26.99

## 2023-01-06 LAB — BASIC METABOLIC PANEL
Anion gap: 11 (ref 5–15)
BUN: 28 mg/dL — ABNORMAL HIGH (ref 8–23)
CO2: 25 mmol/L (ref 22–32)
Calcium: 9.8 mg/dL (ref 8.9–10.3)
Chloride: 103 mmol/L (ref 98–111)
Creatinine, Ser: 1.14 mg/dL — ABNORMAL HIGH (ref 0.44–1.00)
GFR, Estimated: 47 mL/min — ABNORMAL LOW (ref >60)
Glucose, Bld: 95 mg/dL (ref 70–99)
Potassium: 3.9 mmol/L (ref 3.5–5.1)
Sodium: 139 mmol/L (ref 135–145)

## 2023-01-06 LAB — CBC
HCT: 40.9 % (ref 36.0–46.0)
Hemoglobin: 13.7 g/dL (ref 12.0–15.0)
MCH: 31.8 pg (ref 26.0–34.0)
MCHC: 33.5 g/dL (ref 30.0–36.0)
MCV: 94.9 fL (ref 80.0–100.0)
Platelets: 270 10*3/uL (ref 150–400)
RBC: 4.31 MIL/uL (ref 3.87–5.11)
RDW: 11.9 % (ref 11.5–15.5)
WBC: 5 10*3/uL (ref 4.0–10.5)
nRBC: 0 % (ref 0.0–0.2)

## 2023-01-06 LAB — TROPONIN I (HIGH SENSITIVITY)
Troponin I (High Sensitivity): 7 ng/L (ref <18)
Troponin I (High Sensitivity): 7 ng/L (ref <18)

## 2023-01-06 NOTE — ED Triage Notes (Signed)
Pt arrived POV for continued SOB and right side heart fluttering, pt reports same symptoms as on 10/17 when she was admitted for a PE, d/c on the 18th, started on Eliquis 5mg  BID, denies cough of CP at current. Sats 100% RA, HR 81. A&O x4, NAD noted. EKG NSR

## 2023-01-06 NOTE — ED Provider Triage Note (Signed)
Emergency Medicine Provider Triage Evaluation Note  April Douglas , a 89 y.o. female  was evaluated in triage.  Pt complains of SOB today. Has also had right heart fluttering. Symptoms are the same as when diagnosed with PE on 10/17. Compliant with Eliquis. No pain.   Review of Systems  Positive: Heart flutter (right side), SOB Negative: Chest pain  Physical Exam  BP (!) 155/92 (BP Location: Right Arm)   Pulse 81   Temp 98.2 F (36.8 C)   Resp 19   Ht 5' 5.5" (1.664 m)   Wt 64.6 kg   SpO2 100%   BMI 23.34 kg/m  Gen:   Awake, no distress   Resp:  Normal effort  MSK:   Moves extremities without difficulty  Other:  No hypoxia. No tachycardia  Medical Decision Making  Medically screening exam initiated at 7:46 PM.  Appropriate orders placed.  April Douglas was informed that the remainder of the evaluation will be completed by another provider, this initial triage assessment does not replace that evaluation, and the importance of remaining in the ED until their evaluation is complete.     Elpidio Anis, PA-C 01/06/23 1948

## 2023-01-06 NOTE — ED Notes (Signed)
PA-C Shari at bedside for MSE screening in triage

## 2023-01-06 NOTE — ED Provider Notes (Incomplete)
[place] EMERGENCY DEPARTMENT AT [facility] Provider Note  CSN: [id number] Arrival date & time: 01/06/23 1859  Chief Complaint(s) Shortness of Breath  HPI April Douglas is a 89 y.o. female with past medical history as below, significant for hyperlipidemia hypertension, PE, seasonal affective disorder, syncope who presents to the ED with complaint of dib/palpitations .   She was recently mid to the hospital last week, diagnosed with PE and started on DOAC.  She has been compliant with her DOAC.  She had palpitations and mild dyspnea earlier today which is since resolved.  No chest pain.  No fevers or chills.  Compliant home medications.  No nausea or vomiting, no recent travel  Past Medical History Past Medical History:  Diagnosis Date   Dehydration    Dizziness    Elevated serum creatinine    Encounter for loop recorder check 06/27/2019   Glaucoma    Hyperlipidemia    Hypertension    Loop recorder Biotronik 05/21/19 06/08/2019   Pulmonary embolism (HCC)    01/02/2023   Right bundle branch block    Seasonal affective disorder (HCC)    Syncope and collapse 10/23/2018   Patient Active Problem List   Diagnosis Date Noted   Pulmonary emboli (HCC) 01/02/2023   Memory loss 01/02/2023   Gait disturbance 01/02/2023   History of syncope 01/02/2023   Normal pressure hydrocephalus (HCC) 01/02/2023   Encounter for loop recorder check 06/27/2019   Loop recorder Biotronik 05/21/19 06/08/2019   Hyponatremia 12/26/2018   Palpitations 10/23/2018   Syncope and collapse 10/23/2018   Essential hypertension 06/22/2018   Home Medication(s) Prior to Admission medications   Medication Sig Start Date End Date Taking? Authorizing Provider  acetaminophen (TYLENOL) 500 MG tablet Take 2 tablets by mouth every 6 (six) hours as needed for mild pain (pain score 1-3).    [provider]  amLODipine (NORVASC) 2.5 MG tablet TAKE 1 TABLET(2.5 MG) BY MOUTH DAILY Patient taking  differently: Take 1 tablet by mouth daily. Takes at 1400 06/12/22   Tolia, Sunit, DO  APIXABAN (ELIQUIS) VTE STARTER PACK (10MG  AND 5MG ) Take as directed on package: start with two-5mg  tablets twice daily for 7 days. On day 8, switch to one-5mg  tablet twice daily. 01/03/23   Azucena Fallen, MD  CALCIUM CARB-CHOLECALCIFEROL PO Take 1 tablet by mouth daily.    [provider]  carboxymethylcellulose (REFRESH PLUS) 0.5 % SOLN Place 1 drop into both eyes 2 (two) times daily as needed (dry eyes).    [provider]  cetirizine (ZYRTEC) 10 MG tablet Take 1 tablet by mouth daily as needed for allergies or rhinitis.    [provider]  Cholecalciferol (VITAMIN D3) 50 MCG (2000 UT) CHEW Chew 1 Dose by mouth daily as needed (per instructions).    [provider]  cycloSPORINE (RESTASIS) 0.05 % ophthalmic emulsion 1 drop 2 (two) times daily.    [provider]  estradiol (ESTRACE) 0.1 MG/GM vaginal cream 1 Applicatorful once a week. Medication is 0.5/gm Takes two times per week 11/12/22   [provider]  fluticasone (FLONASE) 50 MCG/ACT nasal spray Place 2 sprays into both nostrils at bedtime as needed for allergies or rhinitis (Takes at night when taking Zyrtec).    [provider]  hydrALAZINE (APRESOLINE) 100 MG tablet Take 1 tablet (100 mg total) by mouth 2 (two) times daily. TAKE 1 TABLET(100 MG) BY MOUTH THREE TIMES DAILY Patient taking differently: Take 1 tablet by mouth 2 (two)  times daily. 10/04/22   Tolia, Sunit, DO  memantine (NAMENDA) 10 MG tablet Take 1 tablet by mouth 2 (two) times daily.    [provider]  Menthol, Topical Analgesic, (BIOFREEZE ROLL-ON) 4 % GEL Apply 1 Application topically 2 (two) times daily as needed (For arthritis pain).    [provider]  Multiple Vitamin (MULTIVITAMIN WITH MINERALS) TABS tablet Take 1 tablet by mouth daily as needed (Takes when she remembers).    [provider]   olmesartan (BENICAR) 40 MG tablet TAKE 1 TABLET(40 MG) BY MOUTH EVERY EVENING 10/23/22   Tolia, Sunit, DO  Omega-3 Fatty Acids (SALMON OIL-1000 PO) Take 1 capsule by mouth daily as needed (as instructed).    [provider]  Turmeric (QC TUMERIC COMPLEX) 500 MG CAPS Take 1 capsule by mouth daily as needed (as instructed).    [provider]                                                                                                                                    Past Surgical History Past Surgical History:  Procedure Laterality Date   ABDOMINAL HYSTERECTOMY     TAH-BSO   CATARACT EXTRACTION Bilateral    COLONOSCOPY WITH PROPOFOL N/A 02/06/2015   Procedure: COLONOSCOPY WITH PROPOFOL;  Surgeon: Charolett Bumpers, MD;  Location: [facility] END[facility]COPY;  Service: Endoscopy;  Laterality: N/A;   LOOP RECORDER IMPLANT  05/2019   tooth extraction  2020   Family History Family History  Problem Relation Age of Onset   Hypertension Mother    Hypertension Father    Stroke Sister    Cancer Brother    Heart disease Brother    Cancer Brother    Hypertension Sister    Breast cancer Neg Hx     Social History Social History   Tobacco Use   Smoking status: Never   Smokeless tobacco: Never  Vaping Use   Vaping status: Never Used  Substance Use Topics   Alcohol use: No   Drug use: No   Allergies Patient has no known allergies.  Review of Systems Review of Systems  Constitutional:  Negative for chills and fever.  Respiratory:  Positive for shortness of breath. Negative for chest tightness.   Cardiovascular:  Positive for palpitations.  Gastrointestinal:  Negative for abdominal pain and nausea.  Genitourinary:  Negative for urgency.  Musculoskeletal:  Negative for arthralgias.  Skin:  Negative for wound.  All other systems reviewed and are negative.   Physical Exam Vital Signs  I have reviewed the triage vital signs BP 113/73 (BP Location: Right Arm)   Pulse 61    Temp 97.8 F (36.6 C) (Oral)   Resp 14   Ht 5' 5.5" (1.664 m)   Wt 64.6 kg   SpO2 100%   BMI 23.34 kg/m  Physical Exam Vitals and nursing note reviewed.  Constitutional:      General: She  is not in acute distress.    Appearance: Normal appearance.  HENT:     Head: Normocephalic and atraumatic.     Right Ear: External ear normal.     Left Ear: External ear normal.     Nose: Nose normal.     Mouth/Throat:     Mouth: Mucous membranes are moist.  Eyes:     General: No scleral icterus.       Right eye: No discharge.        Left eye: No discharge.  Cardiovascular:     Rate and Rhythm: Normal rate and regular rhythm.     Pulses: Normal pulses.     Heart sounds: Normal heart sounds.     No S3 or S4 sounds.     Comments: Occ PVC's noted on auscultation  Pulmonary:     Effort: Pulmonary effort is normal. No respiratory distress.     Breath sounds: Normal breath sounds. No stridor.  Abdominal:     General: Abdomen is flat. There is no distension.     Palpations: Abdomen is soft.     Tenderness: There is no abdominal tenderness.  Musculoskeletal:     Cervical back: No rigidity.     Right lower leg: No edema.     Left lower leg: No edema.  Skin:    General: Skin is warm and dry.     Capillary Refill: Capillary refill takes less than 2 seconds.  Neurological:     Mental Status: She is alert.  Psychiatric:        Mood and Affect: Mood normal.        Behavior: Behavior normal. Behavior is cooperative.     ED Results and Treatments Labs (all labs ordered are listed, but only abnormal results are displayed) Labs Reviewed  BASIC METABOLIC PANEL - Abnormal; Notable for the following components:      Result Value   BUN 28 (*)    Creatinine, Ser 1.14 (*)    GFR, Estimated 47 (*)    All other components within normal limits  CBC  TROPONIN I (HIGH SENSITIVITY)  TROPONIN I (HIGH SENSITIVITY)                                                                                                                           Radiology DG Chest 2 View  Result Date: 01/06/2023 CLINICAL DATA:  Shortness of breath and heart palpitations. Recently started blood thinners for cardiac issues. EXAM: CHEST - 2 VIEW COMPARISON:  01/02/2023 FINDINGS: A loop recorder is present. Heart size and pulmonary vascularity are normal. Calcified granuloma in the right mid lung. Lungs are otherwise clear and expanded. No pleural effusions. No pneumothorax. Mediastinal contours appear intact. Degenerative changes in the spine and shoulders. IMPRESSION: No active cardiopulmonary disease. Electronically Signed   By: Burman Nieves M.D.   On: 01/06/2023 22:25    Pertinent labs & imaging results that were available during my care of the patient  were reviewed by me and considered in my medical decision making (see MDM for details).  Medications Ordered in ED Medications - No data to display                                                                                                                                   Procedures Procedures  (including critical care time)  Medical Decision Making / ED Course    Medical Decision Making:    SHARDI DAUPHINAIS is a 89 y.o. female with past medical history as below, significant for hyperlipidemia hypertension, PE, seasonal affective disorder, syncope who presents to the ED with complaint of dib. . The complaint involves an extensive differential diagnosis and also carries with it a high risk of complications and morbidity.  Serious etiology was considered. Ddx includes but is not limited to: In my evaluation of this patient's dyspnea my DDx includes, but is not limited to, pneumonia, pulmonary embolism, pneumothorax, pulmonary edema, metabolic acidosis, asthma, COPD, cardiac cause, anemia, anxiety, etc.    Complete initial physical exam performed, notably the patient  was asymptomatic.    Reviewed and confirmed nursing documentation for past medical history,  family history, social history.  Vital signs reviewed.    Clinical Course as of 01/07/23 0021  Tue Jan 07, 2023  0020 Creatinine(!): 1.14 [SG]  0020 BUN(!): 28 Mild elev, she is able to tolerate PO w/o difficulty, encouraged further PO intake at home [SG]    Clinical Course User Index [SG] Sloan Leiter, DO     On my assessment patient is asymptomatic Labs reviewed, these are stable Occasional PVCs noted on telemetry and on auscultation. Compliant with eliquis, no ongoing dyspnea, HDS, PE seems unlikely at this point. Given recent Hx of PE, a CT was offered to which she declines.   She was recently diagnosed with normal pressure hydrocephalus, did not receive neurosurgery follow-up, will give neurosurgery follow-up.  Ambulate she has previous diagnosis of hydrocephalus at [facility] back in 2019 but did not follow-up at that point  He follows with Dr. Jacinto Halim in the outpatient setting, she has implanted loop recorder that was recently discontinued.  Recommend she follow-up with Dr. Jacinto Halim in the office for further evaluation of her palpitations  Favor palpitation sensation secondary to PVCs.  EKG also shows occasional PVCs that was also shown on prior EKG's.  Encouraged follow-up with cardiology  She is currently asymptomatic, reasonable to discharge home and continue outpatient management  The patient improved significantly and was discharged in stable condition. Detailed discussions were had with the patient regarding current findings, and need for close f/u with PCP or on call doctor. The patient has been instructed to return immediately if the symptoms worsen in any way for re-evaluation. Patient verbalized understanding and is in agreement with current care plan. All questions answered prior to discharge.  Additional history obtained: -Additional history obtained from family -External records from outside source obtained and reviewed including: Chart review  including previous notes, labs, imaging, consultation notes including  Recent admission, home medications   Lab Tests: -I ordered, reviewed, and interpreted labs.   The pertinent results include:   Labs Reviewed  BASIC METABOLIC PANEL - Abnormal; Notable for the following components:      Result Value   BUN 28 (*)    Creatinine, Ser 1.14 (*)    GFR, Estimated 47 (*)    All other components within normal limits  CBC  TROPONIN I (HIGH SENSITIVITY)  TROPONIN I (HIGH SENSITIVITY)    Notable for labs stable  EKG   EKG Interpretation Date/Time:  Monday January 06 2023 19:09:58 EDT Ventricular Rate:  84 PR Interval:  140 QRS Duration:  114 QT Interval:  378 QTC Calculation: 446 R Axis:   74  Text Interpretation: Normal sinus rhythm with sinus arrhythmia Right bundle branch block Abnormal ECG When compared with ECG of 02-Jan-2023 09:49, PREVIOUS ECG IS PRESENT similar to prior Confirmed by Tanda Rockers ([id number]) on 01/07/2023 12:16:39 AM         Imaging Studies ordered: I ordered imaging studies including chest x-ray I independently visualized the following imaging with scope of interpretation limited to determining acute life threatening conditions related to emergency care; findings noted above I independently visualized and interpreted imaging. I agree with the radiologist interpretation   Medicines ordered and prescription drug management: No orders of the defined types were placed in this encounter.   -I have reviewed the patients home medicines and have made adjustments as needed   Consultations Obtained: Not applicable  Cardiac Monitoring: The patient was maintained on a cardiac monitor.  I personally viewed and interpreted the cardiac monitored which showed an underlying rhythm of: NSR Continuous pulse oximetry interpreted by myself, 99% on RA.    Social Determinants of Health:  Diagnosis or treatment significantly limited by social determinants of health: live  at home   Reevaluation: After the interventions noted above, I reevaluated the patient and found that they have resolved  Co morbidities that complicate the patient evaluation  Past Medical History:  Diagnosis Date   Dehydration    Dizziness    Elevated serum creatinine    Encounter for loop recorder check 06/27/2019   Glaucoma    Hyperlipidemia    Hypertension    Loop recorder Biotronik 05/21/19 06/08/2019   Pulmonary embolism (HCC)    01/02/2023   Right bundle branch block    Seasonal affective disorder (HCC)    Syncope and collapse 10/23/2018      Dispostion: Disposition decision including need for hospitalization was considered, and patient discharged from emergency department.    Final Clinical Impression(s) / ED Diagnoses Final diagnoses:  Palpitations  PVC's (premature ventricular contractions)        Sloan Leiter, DO 01/07/23 0017    Sloan Leiter, DO 01/07/23 0021

## 2023-01-07 NOTE — Discharge Instructions (Addendum)
It was a pleasure caring for you today in the emergency department.  Please follow-up with neurosurgery in regards to your recent diagnosis of normal pressure hydrocephalus.  Also please follow-up with cardiology for further evaluation of your palpitations  Please return to the emergency department for any worsening or worrisome symptoms.

## 2023-01-08 DIAGNOSIS — J392 Other diseases of pharynx: Secondary | ICD-10-CM | POA: Diagnosis not present

## 2023-01-08 DIAGNOSIS — N390 Urinary tract infection, site not specified: Secondary | ICD-10-CM | POA: Diagnosis not present

## 2023-01-08 DIAGNOSIS — Z09 Encounter for follow-up examination after completed treatment for conditions other than malignant neoplasm: Secondary | ICD-10-CM | POA: Diagnosis not present

## 2023-01-21 DIAGNOSIS — N952 Postmenopausal atrophic vaginitis: Secondary | ICD-10-CM | POA: Diagnosis not present

## 2023-01-21 DIAGNOSIS — N3020 Other chronic cystitis without hematuria: Secondary | ICD-10-CM | POA: Diagnosis not present

## 2023-01-22 DIAGNOSIS — G9389 Other specified disorders of brain: Secondary | ICD-10-CM | POA: Diagnosis not present

## 2023-02-17 DIAGNOSIS — J029 Acute pharyngitis, unspecified: Secondary | ICD-10-CM | POA: Diagnosis not present

## 2023-02-17 DIAGNOSIS — R8290 Unspecified abnormal findings in urine: Secondary | ICD-10-CM | POA: Diagnosis not present

## 2023-02-17 DIAGNOSIS — Z6824 Body mass index (BMI) 24.0-24.9, adult: Secondary | ICD-10-CM | POA: Diagnosis not present

## 2023-03-07 DIAGNOSIS — M25551 Pain in right hip: Secondary | ICD-10-CM | POA: Diagnosis not present

## 2023-03-07 DIAGNOSIS — G8929 Other chronic pain: Secondary | ICD-10-CM | POA: Diagnosis not present

## 2023-03-09 ENCOUNTER — Other Ambulatory Visit: Payer: Self-pay

## 2023-03-09 ENCOUNTER — Encounter (HOSPITAL_COMMUNITY): Payer: Self-pay

## 2023-03-09 ENCOUNTER — Observation Stay (HOSPITAL_COMMUNITY)
Admission: EM | Admit: 2023-03-09 | Discharge: 2023-03-12 | Disposition: A | Discharge status: Home Health | Payer: Medicare Other | Attending: Internal Medicine | Admitting: Internal Medicine

## 2023-03-09 DIAGNOSIS — S32050A Wedge compression fracture of fifth lumbar vertebra, initial encounter for closed fracture: Secondary | ICD-10-CM | POA: Diagnosis not present

## 2023-03-09 DIAGNOSIS — F0390 Unspecified dementia without behavioral disturbance: Secondary | ICD-10-CM | POA: Diagnosis not present

## 2023-03-09 DIAGNOSIS — M5416 Radiculopathy, lumbar region: Principal | ICD-10-CM

## 2023-03-09 DIAGNOSIS — M48062 Spinal stenosis, lumbar region with neurogenic claudication: Secondary | ICD-10-CM

## 2023-03-09 DIAGNOSIS — Z86711 Personal history of pulmonary embolism: Secondary | ICD-10-CM | POA: Diagnosis not present

## 2023-03-09 DIAGNOSIS — M810 Age-related osteoporosis without current pathological fracture: Secondary | ICD-10-CM | POA: Insufficient documentation

## 2023-03-09 DIAGNOSIS — Z950 Presence of cardiac pacemaker: Secondary | ICD-10-CM | POA: Diagnosis not present

## 2023-03-09 DIAGNOSIS — X509XXA Other and unspecified overexertion or strenuous movements or postures, initial encounter: Secondary | ICD-10-CM | POA: Diagnosis not present

## 2023-03-09 DIAGNOSIS — Z7901 Long term (current) use of anticoagulants: Secondary | ICD-10-CM | POA: Diagnosis not present

## 2023-03-09 DIAGNOSIS — Z79899 Other long term (current) drug therapy: Secondary | ICD-10-CM | POA: Diagnosis not present

## 2023-03-09 DIAGNOSIS — M549 Dorsalgia, unspecified: Secondary | ICD-10-CM | POA: Diagnosis present

## 2023-03-09 DIAGNOSIS — M48061 Spinal stenosis, lumbar region without neurogenic claudication: Secondary | ICD-10-CM | POA: Diagnosis not present

## 2023-03-09 DIAGNOSIS — M4854XD Collapsed vertebra, not elsewhere classified, thoracic region, subsequent encounter for fracture with routine healing: Secondary | ICD-10-CM | POA: Diagnosis not present

## 2023-03-09 DIAGNOSIS — M4316 Spondylolisthesis, lumbar region: Secondary | ICD-10-CM | POA: Diagnosis not present

## 2023-03-09 DIAGNOSIS — M419 Scoliosis, unspecified: Secondary | ICD-10-CM | POA: Diagnosis not present

## 2023-03-09 LAB — BASIC METABOLIC PANEL
Anion gap: 11 (ref 5–15)
BUN: 17 mg/dL (ref 8–23)
CO2: 24 mmol/L (ref 22–32)
Calcium: 9.6 mg/dL (ref 8.9–10.3)
Chloride: 98 mmol/L (ref 98–111)
Creatinine, Ser: 0.87 mg/dL (ref 0.44–1.00)
GFR, Estimated: >60 mL/min (ref >60)
Glucose, Bld: 109 mg/dL — ABNORMAL HIGH (ref 70–99)
Potassium: 3.9 mmol/L (ref 3.5–5.1)
Sodium: 133 mmol/L — ABNORMAL LOW (ref 135–145)

## 2023-03-09 LAB — CBC WITH DIFFERENTIAL/PLATELET
Abs Immature Granulocytes: 0.01 10*3/uL (ref 0.00–0.07)
Basophils Absolute: 0.1 10*3/uL (ref 0.0–0.1)
Basophils Relative: 3 %
Eosinophils Absolute: 0 10*3/uL (ref 0.0–0.5)
Eosinophils Relative: 1 %
HCT: 42.5 % (ref 36.0–46.0)
Hemoglobin: 14.1 g/dL (ref 12.0–15.0)
Immature Granulocytes: 0 %
Lymphocytes Relative: 21 %
Lymphs Abs: 1 10*3/uL (ref 0.7–4.0)
MCH: 31.8 pg (ref 26.0–34.0)
MCHC: 33.2 g/dL (ref 30.0–36.0)
MCV: 95.7 fL (ref 80.0–100.0)
Monocytes Absolute: 0.4 10*3/uL (ref 0.1–1.0)
Monocytes Relative: 9 %
Neutro Abs: 3.1 10*3/uL (ref 1.7–7.7)
Neutrophils Relative %: 66 %
Platelets: 293 10*3/uL (ref 150–400)
RBC: 4.44 MIL/uL (ref 3.87–5.11)
RDW: 12 % (ref 11.5–15.5)
WBC: 4.7 10*3/uL (ref 4.0–10.5)
nRBC: 0 % (ref 0.0–0.2)

## 2023-03-09 MED ORDER — LIDOCAINE 5 % EX PTCH
1.0000 | MEDICATED_PATCH | CUTANEOUS | Status: DC
  Administered 2023-03-09 – 2023-03-11 (×3): 1 via TRANSDERMAL
  Filled 2023-03-09 (×3): qty 1

## 2023-03-09 MED ORDER — KETOROLAC TROMETHAMINE 15 MG/ML IJ SOLN
15.0000 mg | Freq: Once | INTRAMUSCULAR | Status: AC
  Administered 2023-03-09: 15 mg via INTRAVENOUS
  Filled 2023-03-09: qty 1

## 2023-03-09 MED ORDER — OXYCODONE-ACETAMINOPHEN 5-325 MG PO TABS
1.0000 | ORAL_TABLET | Freq: Once | ORAL | Status: AC
  Administered 2023-03-09: 1 via ORAL
  Filled 2023-03-09: qty 1

## 2023-03-09 MED ORDER — DEXAMETHASONE SODIUM PHOSPHATE 10 MG/ML IJ SOLN
10.0000 mg | Freq: Once | INTRAMUSCULAR | Status: AC
  Administered 2023-03-09: 10 mg via INTRAVENOUS
  Filled 2023-03-09: qty 1

## 2023-03-09 NOTE — ED Provider Notes (Signed)
[place] EMERGENCY DEPARTMENT AT [facility] Provider Note   CSN: [id number] Arrival date & time: 03/09/23  2032     History {Add pertinent medical, surgical, social history, OB history to HPI:1} Chief Complaint  Patient presents with   Back Pain    April Douglas is a 89 y.o. female.  89 y/o female with hx of HTN, HLD, PE (on Eliquis), and implanted loop recorder presents to the ED for c/o back pain. Pain began after a car ride to [place] and has persisted. She reports pain being across her low back which radiates to her RLE and foot. Pain aggravated by movement and ambulation. She has had no relief with Tylenol and Voltaren gel. Daughter states that patient saw her PCP who did an outpatient Xray which was noncontributory. No associated fevers, urinary symptoms, bowel/bladder incontinence, extremity numbness, genital or perianal numbness, falls/direct trauma to back. Typically ambulates with a cane as a precaution, only when leaving the home.  The history is provided by the patient, a relative and the spouse. No language interpreter was used.  Back Pain      Home Medications Prior to Admission medications   Medication Sig Start Date End Date Taking? Authorizing Provider  acetaminophen (TYLENOL) 500 MG tablet Take 2 tablets by mouth every 6 (six) hours as needed for mild pain (pain score 1-3).    [provider]  amLODipine (NORVASC) 2.5 MG tablet TAKE 1 TABLET(2.5 MG) BY MOUTH DAILY Patient taking differently: Take 1 tablet by mouth daily. Takes at 1400 06/12/22   Tolia, Sunit, DO  APIXABAN (ELIQUIS) VTE STARTER PACK (10MG  AND 5MG ) Take as directed on package: start with two-5mg  tablets twice daily for 7 days. On day 8, switch to one-5mg  tablet twice daily. 01/03/23   Azucena Fallen, MD  CALCIUM CARB-CHOLECALCIFEROL PO Take 1 tablet by mouth daily.    [provider]  carboxymethylcellulose (REFRESH PLUS) 0.5 % SOLN Place 1 drop into both eyes 2  (two) times daily as needed (dry eyes).    [provider]  cetirizine (ZYRTEC) 10 MG tablet Take 1 tablet by mouth daily as needed for allergies or rhinitis.    [provider]  Cholecalciferol (VITAMIN D3) 50 MCG (2000 UT) CHEW Chew 1 Dose by mouth daily as needed (per instructions).    [provider]  cycloSPORINE (RESTASIS) 0.05 % ophthalmic emulsion 1 drop 2 (two) times daily.    [provider]  estradiol (ESTRACE) 0.1 MG/GM vaginal cream 1 Applicatorful once a week. Medication is 0.5/gm Takes two times per week 11/12/22   [provider]  fluticasone (FLONASE) 50 MCG/ACT nasal spray Place 2 sprays into both nostrils at bedtime as needed for allergies or rhinitis (Takes at night when taking Zyrtec).    [provider]  hydrALAZINE (APRESOLINE) 100 MG tablet Take 1 tablet (100 mg total) by mouth 2 (two) times daily. TAKE 1 TABLET(100 MG) BY MOUTH THREE TIMES DAILY Patient taking differently: Take 1 tablet by mouth 2 (two) times daily. 10/04/22   Tolia, Sunit, DO  memantine (NAMENDA) 10 MG tablet Take 1 tablet by mouth 2 (two) times daily.    [provider]  Menthol, Topical Analgesic, (BIOFREEZE ROLL-ON) 4 % GEL Apply 1 Application topically 2 (two) times daily as needed (For arthritis pain).    [provider]  Multiple Vitamin (MULTIVITAMIN WITH MINERALS) TABS tablet Take 1 tablet by mouth daily as needed (Takes when she remembers).    [provider]  olmesartan (BENICAR) 40 MG tablet TAKE 1 TABLET(40 MG) BY MOUTH EVERY EVENING 10/23/22   Tolia, Sunit, DO  Omega-3 Fatty Acids (SALMON OIL-1000 PO) Take 1 capsule by mouth daily as needed (as instructed).    [provider]  Turmeric (QC TUMERIC COMPLEX) 500 MG CAPS Take 1 capsule by mouth daily as needed (as instructed).    [provider]      Allergies    Patient has no known allergies.    Review of Systems   Review of Systems   Musculoskeletal:  Positive for back pain.  Ten systems reviewed and are negative for acute change, except as noted in the HPI.    Physical Exam Updated Vital Signs BP (!) 181/70 (BP Location: Right Arm)   Pulse 69   Temp 98.8 F (37.1 C) (Oral)   Resp 18   Ht 5\' 5"  (1.651 m)   Wt 64.4 kg   SpO2 100%   BMI 23.63 kg/m   Physical Exam Vitals and nursing note reviewed.  Constitutional:      General: She is not in acute distress.    Appearance: She is well-developed. She is not diaphoretic.     Comments: Nontoxic appearing, pleasant. Patient in NAD.  HENT:     Head: Normocephalic and atraumatic.  Eyes:     General: No scleral icterus.    Conjunctiva/sclera: Conjunctivae normal.  Cardiovascular:     Rate and Rhythm: Normal rate and regular rhythm.     Pulses: Normal pulses.     Comments: DP pulse 2+ in the RLE Pulmonary:     Effort: Pulmonary effort is normal. No respiratory distress.     Breath sounds: No stridor. No wheezing.     Comments: Respirations even and unlabored. Musculoskeletal:        General: Normal range of motion.     Cervical back: Normal range of motion.     Comments: TTP of the right lumbar paraspinal muscles. No lumbosacral midline crepitus, deformity, or step off. Normal ROM of the RLE. No leg shortening or malrotation. RLE warm, well perfused. No edema. No palpable cords.  Skin:    General: Skin is warm and dry.     Coloration: Skin is not pale.     Findings: No erythema or rash.  Neurological:     Mental Status: She is alert and oriented to person, place, and time.     Coordination: Coordination normal.     Comments: Sensation to light touch intact and equal in BLE. 5/5 strength against resistance with R hip flexion, abduction, adduction as well as knee flexion, dorsiflexion and plantarflexion of the R foot.  Psychiatric:        Behavior: Behavior normal.     ED Results / Procedures / Treatments   Labs (all labs ordered are listed, but only  abnormal results are displayed) Labs Reviewed  BASIC METABOLIC PANEL - Abnormal; Notable for the following components:      Result Value   Sodium 133 (*)    Glucose, Bld 109 (*)    All other components within normal limits  CBC WITH DIFFERENTIAL/PLATELET  URINALYSIS, ROUTINE W REFLEX MICROSCOPIC    EKG None  Radiology No results found.  Procedures Procedures  {Document cardiac monitor, telemetry assessment procedure when appropriate:1}  Medications Ordered in ED Medications  ketorolac (TORADOL) 15 MG/ML injection 15 mg (has no administration in time range)  dexamethasone (DECADRON) injection 10 mg (has no administration in time range)  oxyCODONE-acetaminophen (  PERCOCET/ROXICET) 5-325 MG per tablet 1 tablet (has no administration in time range)  lidocaine (LIDODERM) 5 % 1 patch (has no administration in time range)    ED Course/ Medical Decision Making/ A&P   {   Click here for ABCD2, HEART and other calculatorsREFRESH Note before signing :1}                              Medical Decision Making Risk Prescription drug management.   ***  {Document critical care time when appropriate:1} {Document review of labs and clinical decision tools ie heart score, Chads2Vasc2 etc:1}  {Document your independent review of radiology images, and any outside records:1} {Document your discussion with family members, caretakers, and with consultants:1} {Document social determinants of health affecting pt's care:1} {Document your decision making why or why not admission, treatments were needed:1} Final Clinical Impression(s) / ED Diagnoses Final diagnoses:  None    Rx / DC Orders ED Discharge Orders     None

## 2023-03-09 NOTE — ED Triage Notes (Signed)
Complaining of lower back pain that goes across the lumbar spine and right leg pain that goes all the way to her foot. Normally is independent but now can not put any pressure on the rt foot.

## 2023-03-09 NOTE — ED Provider Triage Note (Signed)
Emergency Medicine Provider Triage Evaluation Note  April Douglas , a 89 y.o. female  was evaluated in triage.  Pt complains of back pain. Progressive worsening back pain radiates down to R leg for the past few days.  Now unable to bear weight.  Had xray of lower back done recently without acute changes.  Family concerns because pt can't ambulate   Review of Systems  Positive: As above Negative: As above  Physical Exam  BP (!) 181/70 (BP Location: Right Arm)   Pulse 69   Temp 98.8 F (37.1 C) (Oral)   Resp 18   Ht 5\' 5"  (1.651 m)   Wt 64.4 kg   SpO2 100%   BMI 23.63 kg/m  Gen:   Awake, no distress   Resp:  Normal effort  MSK:   Moves extremities without difficulty  Other:    Medical Decision Making  Medically screening exam initiated at 8:48 PM.  Appropriate orders placed.  April Douglas was informed that the remainder of the evaluation will be completed by another provider, this initial triage assessment does not replace that evaluation, and the importance of remaining in the ED until their evaluation is complete.     April Helper, PA-C 03/09/23 2049

## 2023-03-10 ENCOUNTER — Encounter (HOSPITAL_COMMUNITY): Payer: Self-pay | Admitting: Radiology

## 2023-03-10 ENCOUNTER — Emergency Department (HOSPITAL_COMMUNITY): Payer: Medicare Other

## 2023-03-10 DIAGNOSIS — M48062 Spinal stenosis, lumbar region with neurogenic claudication: Secondary | ICD-10-CM

## 2023-03-10 DIAGNOSIS — M4854XD Collapsed vertebra, not elsewhere classified, thoracic region, subsequent encounter for fracture with routine healing: Secondary | ICD-10-CM | POA: Diagnosis not present

## 2023-03-10 DIAGNOSIS — M5416 Radiculopathy, lumbar region: Secondary | ICD-10-CM | POA: Diagnosis not present

## 2023-03-10 DIAGNOSIS — M419 Scoliosis, unspecified: Secondary | ICD-10-CM | POA: Diagnosis not present

## 2023-03-10 DIAGNOSIS — S32050A Wedge compression fracture of fifth lumbar vertebra, initial encounter for closed fracture: Principal | ICD-10-CM

## 2023-03-10 DIAGNOSIS — M48061 Spinal stenosis, lumbar region without neurogenic claudication: Secondary | ICD-10-CM | POA: Diagnosis not present

## 2023-03-10 DIAGNOSIS — M4316 Spondylolisthesis, lumbar region: Secondary | ICD-10-CM | POA: Diagnosis not present

## 2023-03-10 LAB — TSH: TSH: 0.304 u[IU]/mL — ABNORMAL LOW (ref 0.350–4.500)

## 2023-03-10 LAB — VITAMIN B12: Vitamin B-12: 849 pg/mL (ref 180–914)

## 2023-03-10 LAB — VITAMIN D 25 HYDROXY (VIT D DEFICIENCY, FRACTURES): Vit D, 25-Hydroxy: 35.37 ng/mL (ref 30–100)

## 2023-03-10 MED ORDER — OXYCODONE-ACETAMINOPHEN 5-325 MG PO TABS
1.0000 | ORAL_TABLET | Freq: Once | ORAL | Status: AC
  Administered 2023-03-10: 1 via ORAL
  Filled 2023-03-10: qty 1

## 2023-03-10 MED ORDER — APIXABAN 5 MG PO TABS
5.0000 mg | ORAL_TABLET | Freq: Two times a day (BID) | ORAL | Status: DC
  Administered 2023-03-10 – 2023-03-12 (×5): 5 mg via ORAL
  Filled 2023-03-10 (×5): qty 1

## 2023-03-10 MED ORDER — HYDRALAZINE HCL 50 MG PO TABS
100.0000 mg | ORAL_TABLET | Freq: Two times a day (BID) | ORAL | Status: DC
  Administered 2023-03-10 – 2023-03-11 (×3): 100 mg via ORAL
  Filled 2023-03-10 (×4): qty 2

## 2023-03-10 MED ORDER — POLYVINYL ALCOHOL 1.4 % OP SOLN
1.0000 [drp] | Freq: Two times a day (BID) | OPHTHALMIC | Status: DC | PRN
  Filled 2023-03-10: qty 15

## 2023-03-10 MED ORDER — IRBESARTAN 300 MG PO TABS
300.0000 mg | ORAL_TABLET | Freq: Every evening | ORAL | Status: DC
  Administered 2023-03-10: 300 mg via ORAL
  Filled 2023-03-10: qty 1

## 2023-03-10 MED ORDER — ONDANSETRON HCL 4 MG PO TABS: 4.0000 mg | ORAL_TABLET | Freq: Four times a day (QID) | ORAL | Status: DC | PRN

## 2023-03-10 MED ORDER — DONEPEZIL HCL 5 MG PO TABS
5.0000 mg | ORAL_TABLET | Freq: Every day | ORAL | Status: DC
  Administered 2023-03-10 – 2023-03-11 (×2): 5 mg via ORAL
  Filled 2023-03-10 (×2): qty 1

## 2023-03-10 MED ORDER — ONDANSETRON HCL 4 MG/2ML IJ SOLN
4.0000 mg | Freq: Four times a day (QID) | INTRAMUSCULAR | Status: DC | PRN
Start: 2023-03-10 — End: 2023-03-12

## 2023-03-10 MED ORDER — SENNOSIDES-DOCUSATE SODIUM 8.6-50 MG PO TABS
1.0000 | ORAL_TABLET | Freq: Every evening | ORAL | Status: DC | PRN
  Administered 2023-03-10: 1 via ORAL
  Filled 2023-03-10: qty 1

## 2023-03-10 MED ORDER — MEMANTINE HCL 10 MG PO TABS
10.0000 mg | ORAL_TABLET | Freq: Two times a day (BID) | ORAL | Status: DC
  Administered 2023-03-10 – 2023-03-12 (×5): 10 mg via ORAL
  Filled 2023-03-10 (×5): qty 1

## 2023-03-10 MED ORDER — MUSCLE RUB 10-15 % EX CREA: 1.0000 | TOPICAL_CREAM | Freq: Two times a day (BID) | CUTANEOUS | Status: DC | PRN

## 2023-03-10 MED ORDER — AMLODIPINE BESYLATE 2.5 MG PO TABS
2.5000 mg | ORAL_TABLET | Freq: Every day | ORAL | Status: DC
  Administered 2023-03-10 – 2023-03-11 (×2): 2.5 mg via ORAL
  Filled 2023-03-10 (×2): qty 1

## 2023-03-10 MED ORDER — OXYCODONE-ACETAMINOPHEN 5-325 MG PO TABS
1.0000 | ORAL_TABLET | Freq: Four times a day (QID) | ORAL | Status: DC | PRN
  Administered 2023-03-10 – 2023-03-12 (×6): 1 via ORAL
  Filled 2023-03-10 (×7): qty 1

## 2023-03-10 MED ORDER — ACETAMINOPHEN 325 MG PO TABS: 650.0000 mg | ORAL_TABLET | Freq: Four times a day (QID) | ORAL | Status: DC | PRN

## 2023-03-10 MED ORDER — ACETAMINOPHEN 650 MG RE SUPP: 650.0000 mg | Freq: Four times a day (QID) | RECTAL | Status: DC | PRN

## 2023-03-10 MED ORDER — GADOBUTROL 1 MMOL/ML IV SOLN
6.4000 mL/kg | Freq: Once | INTRAVENOUS | Status: AC | PRN
  Administered 2023-03-10: 412 mL via INTRAVENOUS

## 2023-03-10 MED ORDER — CYCLOSPORINE 0.05 % OP EMUL
1.0000 [drp] | Freq: Two times a day (BID) | OPHTHALMIC | Status: DC
  Administered 2023-03-10 – 2023-03-12 (×4): 1 [drp] via OPHTHALMIC
  Filled 2023-03-10 (×5): qty 30

## 2023-03-10 MED ORDER — KETOROLAC TROMETHAMINE 15 MG/ML IJ SOLN
15.0000 mg | Freq: Once | INTRAMUSCULAR | Status: AC
  Administered 2023-03-10: 15 mg via INTRAVENOUS
  Filled 2023-03-10: qty 1

## 2023-03-10 MED ORDER — CALCITONIN (SALMON) 200 UNIT/ACT NA SOLN
1.0000 | Freq: Every day | NASAL | Status: DC
  Administered 2023-03-10 – 2023-03-12 (×3): 1 via NASAL
  Filled 2023-03-10 (×2): qty 3.7

## 2023-03-10 NOTE — Progress Notes (Signed)
Presents with low back pain and right leg pain and found to have L5 compression fracture.    03/10/23 1650  TOC Brief Assessment  Insurance and Status Reviewed  Patient has primary care physician Yes  Home environment has been reviewed From home with husband  Prior level of function: Pt with cane, rollator@ home.  Prior/Current Home Services No current home services  Social Drivers of Health Review SDOH reviewed no interventions necessary  Readmission risk has been reviewed No  Transition of care needs transition of care needs identified, TOC will continue to follow   PTA independent with ADL's. Per PT's recommendation: outpatient PT and OT. Pt and husband preference is home with home health services, MD made aware.

## 2023-03-10 NOTE — Evaluation (Signed)
Occupational Therapy Evaluation Patient Details Name: April Douglas MRN: [id number] DOB: 06-11-1933 Today's Date: 03/10/2023   History of Present Illness April Douglas is a 89 y.o. female who presents to the ED for c/o back pain. MRI positive for L5 comp fx, L4-L5 stenosiis and impingement, and T11-T12 impingement. Past medical history of HTN, HLD, PE (on Eliquis), and implanted loop recorder.   Clinical Impression   Pt currently at min assist level overall for toilet transfers and simulated LB selfcare sit to stand with use of the RW for support.  Prior to admission, pt lived with her spouse and she was independent with ADLs.  She utilized a cane for mobility in the community but didn't need anything in the house.  Currently, feel she will benefit from acute care OT at this time in order to progress ADL independence for return home at a more independent level.  Recommend HHOT eval for safety.          If plan is discharge home, recommend the following: A little help with walking and/or transfers;A little help with bathing/dressing/bathroom;Assistance with cooking/housework;Help with stairs or ramp for entrance;Assist for transportation    Functional Status Assessment  Patient has had a recent decline in their functional status and demonstrates the ability to make significant improvements in function in a reasonable and predictable amount of time.  Equipment Recommendations  BSC/3in1       Precautions / Restrictions Precautions Precautions: Fall Restrictions Weight Bearing Restrictions Per Provider Order: No      Mobility Bed Mobility Overal bed mobility: Needs Assistance Bed Mobility: Rolling, Sidelying to Sit, Sit to Supine Rolling: Min assist, Used rails Sidelying to sit: Min assist   Sit to supine: Min assist   General bed mobility comments: Mod demonstrational cueing for technique to roll to the right side with min assist to transition LEs off of bed and bring trunk up to  sitting.  Min assist to bring LEs back up in the bed when laying down.    Transfers Overall transfer level: Needs assistance Equipment used: Rolling walker (2 wheels) Transfers: Sit to/from Stand, Bed to chair/wheelchair/BSC Sit to Stand: Supervision     Step pivot transfers: Min assist     General transfer comment: Min instructional cueing for hand placement with sit to stand.      Balance Overall balance assessment: Needs assistance Sitting-balance support: Bilateral upper extremity supported Sitting balance-Leahy Scale: Good     Standing balance support: Bilateral upper extremity supported, During functional activity Standing balance-Leahy Scale: Poor Standing balance comment: Pt needs UE support for mobility and sit to stand transitons.                           ADL either performed or assessed with clinical judgement   ADL Overall ADL's : Needs assistance/impaired Eating/Feeding: Independent;Sitting   Grooming: Wash/dry hands;Wash/dry face;Contact guard assist;Standing   Upper Body Bathing: Set up;Sitting   Lower Body Bathing: Minimal assistance;Sit to/from stand   Upper Body Dressing : Set up;Sitting   Lower Body Dressing: Minimal assistance;Sit to/from stand   Toilet Transfer: Minimal assistance;Ambulation;Rolling walker (2 wheels)   Toileting- Clothing Manipulation and Hygiene: Minimal assistance;Sit to/from stand       Functional mobility during ADLs: Minimal assistance;Rolling walker (2 wheels) General ADL Comments: Pt able to cross one LE over the opposite knee for dressing tasks.  Mod instructional cueing for hand placement with sit to stand and stand to sit  with use of the RW.  Discussed use of a 3:1 over the toilet as current toilet riser does not have arms on it currently.  Also, briefly discussed tub seat/ bench and since pt has access to a walk-in shower, shower seat would be best option and they will look to purchase outside of the  hospital if needed.     Vision Baseline Vision/History: 1 Wears glasses Ability to See in Adequate Light: 0 Adequate Patient Visual Report: No change from baseline Vision Assessment?: Wears glasses for driving     Perception Perception: Not tested       Praxis Praxis: Not tested       Pertinent Vitals/Pain Pain Assessment Pain Assessment: 0-10 Pain Score: 5  Pain Descriptors / Indicators: Discomfort Pain Intervention(s): Limited activity within patient's tolerance, Monitored during session     Extremity/Trunk Assessment Upper Extremity Assessment Upper Extremity Assessment: Overall WFL for tasks assessed   Lower Extremity Assessment Lower Extremity Assessment: Defer to PT evaluation   Cervical / Trunk Assessment Cervical / Trunk Assessment: Normal   Communication     Cognition Arousal: Alert Behavior During Therapy: WFL for tasks assessed/performed Overall Cognitive Status: Within Functional Limits for tasks assessed                                                  Home Living Family/patient expects to be discharged to:: Private residence Living Arrangements: Spouse/significant other Available Help at Discharge: Family;Available 24 hours/day Type of Home: House Home Access: Stairs to enter [employer] of Steps: 2 Entrance Stairs-Rails: Left Home Layout: Two level;Able to live on main level with bedroom/bathroom Alternate Level Stairs-Number of Steps: 12 (6 landing then 6 more steps) Alternate Level Stairs-Rails: Can reach both;Right;Left Bathroom Shower/Tub: Chief Strategy Officer: Standard Bathroom Accessibility: Yes   Home Equipment: Grab bars - tub/shower;Rollator (4 wheels);Cane - single point;Lift chair;Toilet riser (cushioned toilet riser)          Prior Functioning/Environment Prior Level of Function : Independent/Modified Independent;Driving             Mobility Comments: Ind no AD however  occasionally uses walking stick outside of the house ADLs Comments: Ind        OT Problem List: Impaired balance (sitting and/or standing);Decreased knowledge of use of DME or AE;Pain      OT Treatment/Interventions: Self-care/ADL training;DME and/or AE instruction;Therapeutic activities;Balance training;Patient/family education    OT Goals(Current goals can be found in the care plan section) Acute Rehab OT Goals Patient Stated Goal: Pt did not state but agreeable to working in therapy. OT Goal Formulation: With patient/family Time For Goal Achievement: 03/24/23 Potential to Achieve Goals: Good  OT Frequency: Min 1X/week       AM-PAC OT "6 Clicks" Daily Activity     Outcome Measure Help from another person eating meals?: None Help from another person taking care of personal grooming?: A Little Help from another person toileting, which includes using toliet, bedpan, or urinal?: A Little Help from another person bathing (including washing, rinsing, drying)?: A Little Help from another person to put on and taking off regular upper body clothing?: A Little Help from another person to put on and taking off regular lower body clothing?: A Little 6 Click Score: 19   End of Session Equipment Utilized During Treatment: Gait belt;Rolling walker (2 wheels)  Nurse Communication: Mobility status  Activity Tolerance: Patient tolerated treatment well Patient left: in bed;with call bell/phone within reach;with bed alarm set  OT Visit Diagnosis: Unsteadiness on feet (R26.81);Pain Pain - Right/Left:  (back pain)                Time: 5409-8119 OT Time Calculation (min): 43 min Charges:  OT General Charges $OT Visit: 1 Visit OT Evaluation $OT Eval Moderate Complexity: 1 Mod OT Treatments $Self Care/Home Management : 23-37 mins  Perrin Maltese, OTR/L Acute Rehabilitation Services  Office [phone] 03/10/2023

## 2023-03-10 NOTE — ED Notes (Signed)
ED TO INPATIENT HANDOFF REPORT  ED Nurse Name and Phone #: Forde Dandy [phone]  S Name/Age/Gender April Douglas 89 y.o. female Room/Bed: 003C/003C  Code Status   Code Status: Limited: Do not attempt resuscitation (DNR) -DNR-LIMITED -Do Not Intubate/DNI   Home/SNF/Other Home Patient oriented to: self, place, time, and situation Is this baseline? Yes   Triage Complete: Triage complete  Chief Complaint Compression fracture of L5 vertebra (HCC) [S32.050A]  Triage Note Complaining of lower back pain that goes across the lumbar spine and right leg pain that goes all the way to her foot. Normally is independent but now can not put any pressure on the rt foot.   Allergies No Known Allergies  Level of Care/Admitting Diagnosis ED Disposition     ED Disposition  Admit   Condition  --   Comment  Hospital Area: [facility] [[id number]]  Level of Care: Med-Surg [16]  May place patient in observation at [facility] or [facility] if equivalent level of care is available:: No  Covid Evaluation: Asymptomatic - no recent exposure (last 10 days) testing not required  Diagnosis: Compression fracture of L5 vertebra [facility]) [[id number]]  Admitting Physician: Steffanie Rainwater [[id number]]  Attending Physician: Steffanie Rainwater [[id number]]          B Medical/Surgery History Past Medical History:  Diagnosis Date   Dehydration    Dizziness    Elevated serum creatinine    Encounter for loop recorder check 06/27/2019   Glaucoma    Hyperlipidemia    Hypertension    Loop recorder Biotronik 05/21/19 06/08/2019   Pulmonary embolism (HCC)    01/02/2023   Right bundle branch block    Seasonal affective disorder (HCC)    Syncope and collapse 10/23/2018   Past Surgical History:  Procedure Laterality Date   ABDOMINAL HYSTERECTOMY     TAH-BSO   CATARACT EXTRACTION Bilateral    COLONOSCOPY WITH PROPOFOL N/A 02/06/2015   Procedure: COLONOSCOPY WITH PROPOFOL;  Surgeon: Charolett Bumpers, MD;  Location: [facility];  Service: Endoscopy;  Laterality: N/A;   LOOP RECORDER IMPLANT  05/2019   tooth extraction  2020     A IV Location/Drains/Wounds Patient Lines/Drains/Airways Status     Active Line/Drains/Airways     Name Placement date Placement time Site Days   Peripheral IV 03/09/23 22 G Posterior;Right Hand 03/09/23  2319  Hand  1            Intake/Output Last 24 hours No intake or output data in the 24 hours ending 03/10/23 1106  Labs/Imaging Results for orders placed or performed during the hospital encounter of 03/09/23 (from the past 48 hours)  Basic metabolic panel     Status: Abnormal   Collection Time: 03/09/23  8:53 PM  Result Value Ref Range   Sodium 133 (L) 135 - 145 mmol/L   Potassium 3.9 3.5 - 5.1 mmol/L   Chloride 98 98 - 111 mmol/L   CO2 24 22 - 32 mmol/L   Glucose, Bld 109 (H) 70 - 99 mg/dL    Comment: Glucose reference range applies only to samples taken after fasting for at least 8 hours.   BUN 17 8 - 23 mg/dL   Creatinine, Ser 8.65 0.44 - 1.00 mg/dL   Calcium 9.6 8.9 - 78.4 mg/dL   GFR, Estimated >69 >62 mL/min    Comment: (NOTE) Calculated using the CKD-EPI Creatinine Equation (2021)    Anion gap 11 5 - 15    Comment:  Performed at [facility], [address] [place], [place] [address]  CBC with Differential     Status: None   Collection Time: 03/09/23  8:53 PM  Result Value Ref Range   WBC 4.7 4.0 - 10.5 K/uL   RBC 4.44 3.87 - 5.11 MIL/uL   Hemoglobin 14.1 12.0 - 15.0 g/dL   HCT 52.8 41.3 - 24.4 %   MCV 95.7 80.0 - 100.0 fL   MCH 31.8 26.0 - 34.0 pg   MCHC 33.2 30.0 - 36.0 g/dL   RDW 01.0 27.2 - 53.6 %   Platelets 293 150 - 400 K/uL   nRBC 0.0 0.0 - 0.2 %   Neutrophils Relative % 66 %   Neutro Abs 3.1 1.7 - 7.7 K/uL   Lymphocytes Relative 21 %   Lymphs Abs 1.0 0.7 - 4.0 K/uL   Monocytes Relative 9 %   Monocytes Absolute 0.4 0.1 - 1.0 K/uL   Eosinophils Relative 1 %   Eosinophils Absolute 0.0 0.0 - 0.5  K/uL   Basophils Relative 3 %   Basophils Absolute 0.1 0.0 - 0.1 K/uL   Immature Granulocytes 0 %   Abs Immature Granulocytes 0.01 0.00 - 0.07 K/uL    Comment: Performed at [facility], [address], [place], [place] [address]   MR Lumbar Spine W Wo Contrast Result Date: 03/10/2023 CLINICAL DATA:  Lumbar radiculopathy.  Trauma. EXAM: MRI LUMBAR SPINE WITHOUT AND WITH CONTRAST TECHNIQUE: Multiplanar and multiecho pulse sequences of the lumbar spine were obtained without and with intravenous contrast. CONTRAST:  GADAVIST GADOBUTROL 1 MMOL/ML IV SOLN COMPARISON:  None Available. FINDINGS: Segmentation:  Standard. Alignment:  Mild scoliosis and L4-5, L3-4 anterolisthesis. Vertebrae: Low-grade marrow edema in the L5 body with horizontal pattern beneath the mildly depressed superior endplate. Remote and healed T12 superior endplate fracture with mild height loss. No evidence of aggressive bone lesion. Conus medullaris and cauda equina: Conus extends to the L1 level. Conus and cauda equina appear normal. Paraspinal and other soft tissues: Atrophy of intrinsic back muscles with fatty infiltration. Mild paravertebral edema at the level of fracture. Disc levels: T11-12: Degenerative disc bulging and facet spurring causing biforaminal impingement. Bulging disc crowds the cord without compression. T12- L1: Central and right foraminal protrusion. Asymmetric right facet spurring. Moderate right foraminal stenosis. L1-L2: Mild facet spurring. L2-L3: Disc bulging and moderate facet spurring with ligamentum flavum thickening. L3-L4: Degenerative facet spurring which is bulky on the right. Circumferential disc bulging. Mild to moderate narrowing of the thecal sac. Mild right foraminal narrowing L4-L5: Advanced degenerative facet spurring with anterolisthesis. The disc is narrowed and bulging with right foraminal protrusion. Moderate triangular narrowing the thecal sac. Asymmetric impingement the right  subarticular recess with L5 root flattening. Mild to moderate right foraminal stenosis. L5-S1:Disc bulging with endplate and facet spurring eccentric to the left. Mild left foraminal narrowing. Degenerative facet spurring which is asymmetrically bulky on the right. IMPRESSION: 1. Acute or subacute L5 compression fracture with mild superior endplate depression. 2. Remote and healed T12 compression fracture. 3. Generalized lumbar spine degeneration. Degeneration especially affects facets with scoliosis and L3-4, L4-5 anterolisthesis. 4. L4-5 moderate spinal stenosis with impingement at the right more than left subarticular recess. 5. Foraminal impingement bilaterally at T11-12. Electronically Signed   By: Tiburcio Pea M.D.   On: 03/10/2023 08:56    Pending Labs Unresulted Labs (From admission, onward)     Start     Ordered   03/11/23 0500  Vitamin B12  Tomorrow morning,   R        03/10/23 1012   03/11/23 0500  VITAMIN D 25 Hydroxy (Vit-D Deficiency, Fractures)  Tomorrow morning,   R        03/10/23 1012            Vitals/Pain Today's Vitals   03/10/23 0849 03/10/23 0911 03/10/23 0915 03/10/23 1035  BP:  111/78  107/61  Pulse:  (!) 59    Resp:  17    Temp: 98.5 F (36.9 C)     TempSrc: Oral     SpO2:  100%    Weight:      Height:      PainSc:  0-No pain 0-No pain     Isolation Precautions No active isolations  Medications Medications  lidocaine (LIDODERM) 5 % 1 patch (1 patch Transdermal Patch Removed 03/10/23 1049)  acetaminophen (TYLENOL) tablet 650 mg (has no administration in time range)    Or  acetaminophen (TYLENOL) suppository 650 mg (has no administration in time range)  senna-docusate (Senokot-S) tablet 1 tablet (has no administration in time range)  ondansetron (ZOFRAN) tablet 4 mg (has no administration in time range)    Or  ondansetron (ZOFRAN) injection 4 mg (has no administration in time range)  amLODipine (NORVASC) tablet 2.5 mg (2.5 mg Oral Given  03/10/23 1035)  carboxymethylcellulose (REFRESH PLUS) 0.5 % ophthalmic solution 1 drop (has no administration in time range)  cycloSPORINE (RESTASIS) 0.05 % ophthalmic emulsion 1 drop (has no administration in time range)  donepezil (ARICEPT) tablet 5 mg (has no administration in time range)  apixaban (ELIQUIS) tablet 5 mg (5 mg Oral Given 03/10/23 1045)  hydrALAZINE (APRESOLINE) tablet 100 mg (100 mg Oral Given 03/10/23 1045)  memantine (NAMENDA) tablet 10 mg (10 mg Oral Given 03/10/23 1045)  Menthol (Topical Analgesic) 4 % GEL 1 Application (has no administration in time range)  irbesartan (AVAPRO) tablet 300 mg (has no administration in time range)  oxyCODONE-acetaminophen (PERCOCET/ROXICET) 5-325 MG per tablet 1 tablet (has no administration in time range)  ketorolac (TORADOL) 15 MG/ML injection 15 mg (15 mg Intravenous Given 03/09/23 2323)  dexamethasone (DECADRON) injection 10 mg (10 mg Intravenous Given 03/09/23 2324)  oxyCODONE-acetaminophen (PERCOCET/ROXICET) 5-325 MG per tablet 1 tablet (1 tablet Oral Given 03/09/23 2319)  oxyCODONE-acetaminophen (PERCOCET/ROXICET) 5-325 MG per tablet 1 tablet (1 tablet Oral Given 03/10/23 0453)  ketorolac (TORADOL) 15 MG/ML injection 15 mg (15 mg Intravenous Given 03/10/23 0454)  gadobutrol (GADAVIST) 1 MMOL/ML injection 412 mL (412 mLs Intravenous Contrast Given 03/10/23 0847)    Mobility walks     Focused Assessments Pulmonary Assessment Handoff:  Lung sounds:   O2 Device: Room Air      R Recommendations: See Admitting Provider Note  Report given to:   Additional Notes: /

## 2023-03-10 NOTE — H&P (Signed)
History and Physical    Patient: April Douglas ZHY:[id number] DOB: [birth year] DOA: 03/09/2023 DOS: the patient was seen and examined on 03/10/2023 PCP: Clayborn Heron, MD  Patient coming from: Home  Chief Complaint:  Chief Complaint  Patient presents with   Back Pain   HPI: April Douglas is a 89 y.o. female with medical history significant of HTN, HLD, PE on Eliquis, syncope and palpitations s/p loop recorder, and RBBB who presented to the ED for evaluation of low back pain and right leg pain.  Patient states over the last 2 weeks, she has had low back pain. She describes the pain as shooting from her low back to her right hip and right leg.  She sat in the car for 4 hours to [place] and her pain was worse after arrival.  She has tried heating pads and topical analgesics without significant relief. Patient was evaluated by her PCP on Friday and an x-ray showed moderate lumbar spondylosis and mild right hip osteoarthritis. Per daughter, patient was unable to walk yesterday due to the pain so they have presented to the ED for further evaluation. She continues to endorse low back pain with movement and difficulty ambulating while in the ED. She also endorsed some neck tightness but denies any recent falls, injuries, fevers, chills, bladder incontinence, bowel incontinence, numbness or tingling.  ED course: Initial vitals with temp 98.8, RR 18, HR 69, BP 181/70, SpO2 100% on room air Labs show sodium 133, K+ 3.9, creatinine 0.87, normal CBC with no leukocytosis or anemia Patient received IV Decadron 10 mg x 1, IV Toradol 50 mg x 1, Percocet 5-325 mg x 1 and a lidocaine patch TRH was consulted for evaluation MRI L-spine pending  Review of Systems: As mentioned in the history of present illness. All other systems reviewed and are negative. Past Medical History:  Diagnosis Date   Dehydration    Dizziness    Elevated serum creatinine    Encounter for loop recorder check 06/27/2019    Glaucoma    Hyperlipidemia    Hypertension    Loop recorder Biotronik 05/21/19 06/08/2019   Pulmonary embolism (HCC)    01/02/2023   Right bundle branch block    Seasonal affective disorder (HCC)    Syncope and collapse 10/23/2018   Past Surgical History:  Procedure Laterality Date   ABDOMINAL HYSTERECTOMY     TAH-BSO   CATARACT EXTRACTION Bilateral    COLONOSCOPY WITH PROPOFOL N/A 02/06/2015   Procedure: COLONOSCOPY WITH PROPOFOL;  Surgeon: Charolett Bumpers, MD;  Location: [facility];  Service: Endoscopy;  Laterality: N/A;   LOOP RECORDER IMPLANT  05/2019   tooth extraction  2020   Social History:  reports that she has never smoked. She has never used smokeless tobacco. She reports that she does not drink alcohol and does not use drugs.  No Known Allergies  Family History  Problem Relation Age of Onset   Hypertension Mother    Hypertension Father    Stroke Sister    Cancer Brother    Heart disease Brother    Cancer Brother    Hypertension Sister    Breast cancer Neg Hx     Prior to Admission medications   Medication Sig Start Date End Date Taking? Authorizing Provider  acetaminophen (TYLENOL) 500 MG tablet Take 2 tablets by mouth every 6 (six) hours as needed for mild pain (pain score 1-3).   Yes [provider]  amLODipine (NORVASC) 2.5 MG tablet TAKE 1  TABLET(2.5 MG) BY MOUTH DAILY Patient taking differently: Take 1 tablet by mouth daily. Takes at 1400 06/12/22  Yes Tolia, Sunit, DO  CALCIUM CARB-CHOLECALCIFEROL PO Take 1 tablet by mouth daily.   Yes [provider]  carboxymethylcellulose (REFRESH PLUS) 0.5 % SOLN Place 1 drop into both eyes 2 (two) times daily as needed (dry eyes).   Yes [provider]  cetirizine (ZYRTEC) 10 MG tablet Take 1 tablet by mouth daily as needed for allergies or rhinitis.   Yes [provider]  Cholecalciferol (VITAMIN D3) 50 MCG (2000 UT) CHEW Chew 1 Dose by mouth daily as needed (per instructions).    Yes [provider]  cycloSPORINE (RESTASIS) 0.05 % ophthalmic emulsion 1 drop 2 (two) times daily.   Yes [provider]  donepezil (ARICEPT) 5 MG tablet Take 5 mg by mouth at bedtime. 01/27/23  Yes [provider]  ELIQUIS 5 MG TABS tablet Take 5 mg by mouth 2 (two) times daily. 01/27/23  Yes [provider]  estradiol (ESTRACE) 0.1 MG/GM vaginal cream 1 Applicatorful once a week. Medication is 0.5/gm Takes two times per week 11/12/22  Yes [provider]  fluticasone (FLONASE) 50 MCG/ACT nasal spray Place 2 sprays into both nostrils at bedtime as needed for allergies or rhinitis (Takes at night when taking Zyrtec).   Yes [provider]  hydrALAZINE (APRESOLINE) 100 MG tablet Take 1 tablet (100 mg total) by mouth 2 (two) times daily. TAKE 1 TABLET(100 MG) BY MOUTH THREE TIMES DAILY Patient taking differently: Take 1 tablet by mouth 2 (two) times daily. 10/04/22  Yes Tolia, Sunit, DO  memantine (NAMENDA) 10 MG tablet Take 1 tablet by mouth 2 (two) times daily.   Yes [provider]  Menthol, Topical Analgesic, (BIOFREEZE ROLL-ON) 4 % GEL Apply 1 Application topically 2 (two) times daily as needed (For arthritis pain).   Yes [provider]  Multiple Vitamin (MULTIVITAMIN WITH MINERALS) TABS tablet Take 1 tablet by mouth daily as needed (Takes when she remembers).   Yes [provider]  olmesartan (BENICAR) 40 MG tablet TAKE 1 TABLET(40 MG) BY MOUTH EVERY EVENING 10/23/22  Yes Tolia, Sunit, DO  Omega-3 Fatty Acids (SALMON OIL-1000 PO) Take 1 capsule by mouth daily as needed (as instructed).   Yes [provider]  Turmeric (QC TUMERIC COMPLEX) 500 MG CAPS Take 1 capsule by mouth daily as needed (as instructed).   Yes [provider]    Physical Exam: Vitals:   03/10/23 0115 03/10/23 0345 03/10/23 0432 03/10/23 0600  BP: 137/71 114/69  117/88  Pulse: (!) 51 (!) 51  (!) 53  Resp: 16 18  16   Temp:   98.3  F (36.8 C)   TempSrc:   Oral   SpO2: 100% 98%  100%  Weight:      Height:       General: Pleasant, well-appearing elderly woman laying in bed. No acute distress. HEENT: [place]/AT. Anicteric sclera CV: RRR. No murmurs, rubs, or gallops. No LE edema Pulmonary: Lungs CTAB. Normal effort. No wheezing or rales. Abdominal: Soft, nontender, nondistended. Normal bowel sounds. MSK: Mild tenderness to palpation of the L-spine. Negative straight leg test. Limited ROM.  Skin: Warm and dry. No obvious rash or lesions. Neuro: A&Ox3. Moves all extremities. Normal sensation to light touch. No focal deficit. Psych: Normal mood and affect  Data Reviewed: Labs show sodium 133, K+ 3.9, creatinine 0.87, normal CBC with no leukocytosis or anemia MRI L-spine showed acute or  subacute L5 compression fracture with mild superior endplate depression, degenerative disc disease of the L-spine and L5-5 moderate spinal stenosis with impingement of the right more than the left subarticular recess, foraminal impingement bilaterally at T11/12  Assessment and Plan: April Douglas is a 89 y.o. female with medical history significant of HTN, HLD, osteoarthritis, mild dementia, PE on Eliquis, syncope and palpitations s/p loop recorder, and RBBB who presented to the ED for evaluation of low back pain and right leg pain and found to have L5 compression fracture.  # L5 compression fracture # Moderate spinal stenosis Elderly patient with history of osteoarthritis presenting with low back pain with associated radiculopathy as well as difficulty with ambulation found to have L5 compression fracture and moderate spinal stenosis. She is able to put some pressure on her right leg however continues to have difficulty with ambulation. Patient likely has osteoporosis in the setting of nontraumatic compression fracture. Will observe overnight for pain control. PT evaluated patient and recommend outpatient PT. -Neurosurgery consulted, appreciate  recs -As needed Tylenol and Percocet for pain -Continue home as needed mentol 4% gel -Lidocaine patch every 24 hours -Calcitonin nasal spray daily -Continue PT/OT eval -Fall precautions -Follow-up vitamin D levels  # HTN BP well-controlled with SBP in the 110s to 130 -Continue hydralazine, amlodipine and irbesartan (substitute for olmesartan)  # PE on Eliquis -Continue Eliquis 5 mg daily  # Mild dementia -Continue memantine and donepezil -Delirium precautions   Advance Care Planning:   Code Status: Limited: Do not attempt resuscitation (DNR) -DNR-LIMITED -Do Not Intubate/DNI    Consults: Neurosurgery  Family Communication: Discussed admission with daughter at bedside  Severity of Illness: The appropriate patient status for this patient is OBSERVATION. Observation status is judged to be reasonable and necessary in order to provide the required intensity of service to ensure the patient's safety. The patient's presenting symptoms, physical exam findings, and initial radiographic and laboratory data in the context of their medical condition is felt to place them at decreased risk for further clinical deterioration. Furthermore, it is anticipated that the patient will be medically stable for discharge from the hospital within 2 midnights of admission.   Author: Steffanie Rainwater, MD 03/10/2023 7:57 AM  For on call review [email or web address].

## 2023-03-10 NOTE — ED Notes (Signed)
Physical therapy at bedside

## 2023-03-10 NOTE — Evaluation (Signed)
Physical Therapy Evaluation Patient Details Name: April Douglas MRN: [id number] DOB: 09-24-1933 Today's Date: 03/10/2023  History of Present Illness  April Douglas is a 89 y.o. female who presents to the ED for c/o back pain. Past medical history of HTN, HLD, PE (on Eliquis), and implanted loop recorder.  Clinical Impression  Pt presents with admitting diagnosis above. Pt today was able to ambulate short distance in hallway with RW CGA however required Min A for bed mobility. PTA pt was fully independent occasionally using a walking stick outside the house per daughter. Recommend OPPT upon DC with RW, WC, and BSC. PT will continue to follow.         If plan is discharge home, recommend the following: A little help with walking and/or transfers;A little help with bathing/dressing/bathroom;Assistance with cooking/housework;Assist for transportation;Help with stairs or ramp for entrance   Can travel by private vehicle        Equipment Recommendations Rolling walker (2 wheels);BSC/3in1;Wheelchair (measurements PT);Wheelchair cushion (measurements PT)  Recommendations for Other Services       Functional Status Assessment Patient has had a recent decline in their functional status and demonstrates the ability to make significant improvements in function in a reasonable and predictable amount of time.     Precautions / Restrictions Precautions Precautions: Fall Restrictions Weight Bearing Restrictions Per Provider Order: No      Mobility  Bed Mobility Overal bed mobility: Needs Assistance Bed Mobility: Rolling, Sidelying to Sit, Sit to Supine Rolling: Contact guard assist Sidelying to sit: Min assist   Sit to supine: Min assist   General bed mobility comments: Assistance with BLE due to height of stretcher. Cued for logroll technique however pt did not comply.    Transfers Overall transfer level: Needs assistance Equipment used: Rolling walker (2 wheels) Transfers: Sit  to/from Stand Sit to Stand: Supervision           General transfer comment: Cues for hand placement    Ambulation/Gait Ambulation/Gait assistance: Contact guard assist Gait Distance (Feet): 15 Feet Assistive device: Rolling walker (2 wheels) Gait Pattern/deviations: Trunk flexed, Decreased stance time - right, Decreased stride length, Step-through pattern Gait velocity: decreased     General Gait Details: Very slow step through gait pattern. pt noted to grimace when WBing through RLE and appeared to be almost reluctant to WB.  Stairs            Wheelchair Mobility     Tilt Bed    Modified Rankin (Stroke Patients Only)       Balance Overall balance assessment: Needs assistance Sitting-balance support: Bilateral upper extremity supported Sitting balance-Leahy Scale: Good     Standing balance support: Bilateral upper extremity supported, During functional activity Standing balance-Leahy Scale: Poor Standing balance comment: Reliant on RW                             Pertinent Vitals/Pain Pain Assessment Pain Assessment: No/denies pain    Home Living Family/patient expects to be discharged to:: Private residence Living Arrangements: Spouse/significant other Available Help at Discharge: Family;Available 24 hours/day Type of Home: House Home Access: Stairs to enter Entrance Stairs-Rails: Left Entrance Stairs-Number of Steps: 2 Alternate Level Stairs-Number of Steps: 12 (6 landing then 6 more steps) Home Layout: Two level;Able to live on main level with bedroom/bathroom Home Equipment: Grab bars - tub/shower;Rollator (4 wheels);Cane - single point;Lift chair      Prior Function Prior Level of Function :  Independent/Modified Independent;Driving             Mobility Comments: Ind no AD however occasionally uses walking stick outside of the house ADLs Comments: Ind     Extremity/Trunk Assessment   Upper Extremity Assessment Upper  Extremity Assessment: Overall WFL for tasks assessed    Lower Extremity Assessment Lower Extremity Assessment: RLE deficits/detail RLE Deficits / Details: 4/5 hip flexion and knee extension likely due to pain.    Cervical / Trunk Assessment Cervical / Trunk Assessment: Normal  Communication   Communication Communication: No apparent difficulties  Cognition Arousal: Alert Behavior During Therapy: WFL for tasks assessed/performed Overall Cognitive Status: Within Functional Limits for tasks assessed                                 General Comments: Very pleasant however can be tangential at times.        General Comments General comments (skin integrity, edema, etc.): VSS on RA    Exercises     Assessment/Plan    PT Assessment Patient needs continued PT services  PT Problem List Decreased strength;Decreased range of motion;Decreased activity tolerance;Decreased balance;Decreased mobility;Decreased coordination;Decreased knowledge of use of DME;Decreased safety awareness;Decreased knowledge of precautions;Cardiopulmonary status limiting activity       PT Treatment Interventions DME instruction;Gait training;Stair training;Functional mobility training;Therapeutic exercise;Therapeutic activities;Balance training;Neuromuscular re-education;Patient/family education    PT Goals (Current goals can be found in the Care Plan section)  Acute Rehab PT Goals Patient Stated Goal: to get better PT Goal Formulation: With patient Time For Goal Achievement: 03/24/23 Potential to Achieve Goals: Good    Frequency Min 1X/week     Co-evaluation               AM-PAC PT "6 Clicks" Mobility  Outcome Measure Help needed turning from your back to your side while in a flat bed without using bedrails?: A Little Help needed moving from lying on your back to sitting on the side of a flat bed without using bedrails?: A Little Help needed moving to and from a bed to a chair  (including a wheelchair)?: A Little Help needed standing up from a chair using your arms (e.g., wheelchair or bedside chair)?: A Little Help needed to walk in hospital room?: A Little Help needed climbing 3-5 steps with a railing? : A Lot 6 Click Score: 17    End of Session Equipment Utilized During Treatment: Gait belt Activity Tolerance: Patient limited by pain;Patient tolerated treatment well Patient left: in bed;with call bell/phone within reach;with family/visitor present Nurse Communication: Mobility status PT Visit Diagnosis: Other abnormalities of gait and mobility (R26.89)    Time: 1610-9604 PT Time Calculation (min) (ACUTE ONLY): 48 min   Charges:   PT Evaluation $PT Eval Moderate Complexity: 1 Mod PT Treatments $Gait Training: 8-22 mins $Therapeutic Activity: 8-22 mins PT General Charges $$ ACUTE PT VISIT: 1 Visit         Shela Nevin, PT, DPT Acute Rehab Services [phone]   Gladys Damme 03/10/2023, 10:29 AM

## 2023-03-10 NOTE — ED Notes (Signed)
Patient ambulated in hallway with assistance from RN and walker. Patient wincing in pain when bearing weight on right side.

## 2023-03-10 NOTE — ED Notes (Addendum)
Patient attempted to ambulate with walker and assistance. Grimacing while bearing weight on right side. Patient states pain is tolerable when at rest.

## 2023-03-11 DIAGNOSIS — M5416 Radiculopathy, lumbar region: Principal | ICD-10-CM

## 2023-03-11 DIAGNOSIS — S32050A Wedge compression fracture of fifth lumbar vertebra, initial encounter for closed fracture: Secondary | ICD-10-CM | POA: Diagnosis not present

## 2023-03-11 DIAGNOSIS — M48062 Spinal stenosis, lumbar region with neurogenic claudication: Secondary | ICD-10-CM

## 2023-03-11 MED ORDER — DOCUSATE SODIUM 50 MG PO CAPS
50.0000 mg | ORAL_CAPSULE | Freq: Once | ORAL | Status: DC
  Filled 2023-03-11: qty 1

## 2023-03-11 MED ORDER — OXYCODONE-ACETAMINOPHEN 5-325 MG PO TABS: 1.0000 | ORAL_TABLET | Freq: Four times a day (QID) | ORAL | 0 refills | Status: DC | PRN

## 2023-03-11 NOTE — Plan of Care (Signed)

## 2023-03-11 NOTE — Progress Notes (Signed)
    Durable Medical Equipment  (From admission, onward)           Start     Ordered   03/11/23 1656  For home use only DME Walker rolling  Once       Question Answer Comment  Walker: With 5 Inch Wheels   Patient needs a walker to treat with the following condition Gait instability      03/11/23 1657   03/11/23 1656  For home use only DME lightweight manual wheelchair with seat cushion  Once       Comments: Patient suffers from  L5 comp fx, L4-L5 stenosiis and impingement, which impairs their ability to perform daily activities like dressing in the home.  A walker will   ,not resolve  issue with performing activities of daily living. A wheelchair will allow patient to safely perform daily activities. Patient is not able to propel themselves in the home using a standard weight wheelchair due to general weakness. Patient can self propel in the lightweight wheelchair. Length of need 6 months . Accessories: elevating leg rests (ELRs), wheel locks, extensions and anti-tippers.   03/11/23 1657

## 2023-03-11 NOTE — Progress Notes (Signed)
    Durable Medical Equipment  (From admission, onward)           Start     Ordered   03/11/23 1726  For home use only DME Bedside commode  Once       Comments: Confine to one room  Question:  Patient needs a bedside commode to treat with the following condition  Answer:  Gait instability   03/11/23 1726   03/11/23 1656  For home use only DME Walker rolling  Once       Question Answer Comment  Walker: With 5 Inch Wheels   Patient needs a walker to treat with the following condition Gait instability      03/11/23 1657   03/11/23 1656  For home use only DME lightweight manual wheelchair with seat cushion  Once       Comments: Patient suffers from  L5 comp fx, L4-L5 stenosiis and impingement, which impairs their ability to perform daily activities like dressing in the home.  A walker will   ,not resolve  issue with performing activities of daily living. A wheelchair will allow patient to safely perform daily activities. Patient is not able to propel themselves in the home using a standard weight wheelchair due to general weakness. Patient can self propel in the lightweight wheelchair. Length of need 6 months . Accessories: elevating leg rests (ELRs), wheel locks, extensions and anti-tippers.   03/11/23 1657

## 2023-03-11 NOTE — Discharge Summary (Signed)
Physician Discharge Summary  April Douglas VOZ:[id number] DOB: 1933-11-23 DOA: 03/09/2023  PCP: Clayborn Heron, MD  Admit date: 03/09/2023 Discharge date: 03/11/2023  Admitted From: Home Disposition: Home  Recommendations for Outpatient Follow-up:  Follow up with PCP in 1-2 weeks Follow-up with orthopedic surgery as scheduled:  Home Health: PT OT Equipment/Devices: No new equipment  Discharge Condition: Stable CODE STATUS: DNR/limited Diet recommendation: As tolerated  Brief/Interim Summary: April Douglas is a 89 y.o. female with medical history significant of HTN, HLD, osteoarthritis, mild dementia, PE on Eliquis, syncope and palpitations s/p loop recorder, and RBBB who presented to the ED for evaluation of low back pain and right leg pain and found to have L5 compression fracture.  Patient admitted given ambulatory dysfunction and evaluated by PT OT recommending home health physical therapy and Occupational Therapy.  Neurosurgery was consulted for further recommendations, no indication for intervention or surgical procedure at this time, discharge home with therapy and brace per their recommendations.  Outpatient follow-up per their office schedule, otherwise stable and agreeable to discharge home.  Discharge Diagnoses:  Principal Problem:   Compression fracture of L5 vertebra (HCC) Active Problems:   Lumbar radiculopathy   Spinal stenosis of lumbar region with neurogenic claudication    Discharge Instructions  Discharge Instructions     Face-to-face encounter (required for Medicare/Medicaid patients)   Complete by: As directed    The encounter with the patient was in whole, or in part, for the following medical condition, which is the primary reason for home health care: Ambulatory dysfunction   I certify that, based on my findings, the following services are medically necessary home health services: Physical therapy   Reason for Medically Necessary Home Health  Services:  Skilled Nursing- Change/Decline in Patient Status Therapy- Investment banker, operational, Patent examiner Therapy- Instruction on Safe use of Assistive Devices for ADLs     My clinical findings support the need for the above services: Unable to leave home safely without assistance and/or assistive device   Further, I certify that my clinical findings support that this patient is homebound due to: Unable to leave home safely without assistance   Home Health   Complete by: As directed    To provide the following care/treatments:  PT OT        Allergies as of 03/11/2023   No Known Allergies      Medication List     TAKE these medications    acetaminophen 500 MG tablet Commonly known as: TYLENOL Take 2 tablets by mouth every 6 (six) hours as needed for mild pain (pain score 1-3).   amLODipine 2.5 MG tablet Commonly known as: NORVASC TAKE 1 TABLET(2.5 MG) BY MOUTH DAILY What changed: See the new instructions.   Biofreeze Roll-On 4 % Gel Generic drug: Menthol (Topical Analgesic) Apply 1 Application topically 2 (two) times daily as needed (For arthritis pain).   CALCIUM CARB-CHOLECALCIFEROL PO Take 1 tablet by mouth daily.   carboxymethylcellulose 0.5 % Soln Commonly known as: REFRESH PLUS Place 1 drop into both eyes 2 (two) times daily as needed (dry eyes).   cetirizine 10 MG tablet Commonly known as: ZYRTEC Take 1 tablet by mouth daily as needed for allergies or rhinitis.   cycloSPORINE 0.05 % ophthalmic emulsion Commonly known as: RESTASIS 1 drop 2 (two) times daily.   donepezil 5 MG tablet Commonly known as: ARICEPT Take 5 mg by mouth at bedtime.   Eliquis 5 MG Tabs tablet Generic drug: apixaban Take  5 mg by mouth 2 (two) times daily.   estradiol 0.1 MG/GM vaginal cream Commonly known as: ESTRACE 1 Applicatorful once a week. Medication is 0.5/gm Takes two times per week   fluticasone 50 MCG/ACT nasal spray Commonly known as: FLONASE Place  2 sprays into both nostrils at bedtime as needed for allergies or rhinitis (Takes at night when taking Zyrtec).   hydrALAZINE 100 MG tablet Commonly known as: APRESOLINE Take 1 tablet (100 mg total) by mouth 2 (two) times daily. TAKE 1 TABLET(100 MG) BY MOUTH THREE TIMES DAILY What changed: additional instructions   memantine 10 MG tablet Commonly known as: NAMENDA Take 1 tablet by mouth 2 (two) times daily.   multivitamin with minerals Tabs tablet Take 1 tablet by mouth daily as needed (Takes when she remembers).   olmesartan 40 MG tablet Commonly known as: BENICAR TAKE 1 TABLET(40 MG) BY MOUTH EVERY EVENING   oxyCODONE-acetaminophen 5-325 MG tablet Commonly known as: PERCOCET/ROXICET Take 1 tablet by mouth every 6 (six) hours as needed for moderate pain (pain score 4-6).   QC Tumeric Complex 500 MG Caps Generic drug: Turmeric Take 1 capsule by mouth daily as needed (as instructed).   SALMON OIL-1000 PO Take 1 capsule by mouth daily as needed (as instructed).   Vitamin D3 50 MCG (2000 UT) Chew Chew 1 Dose by mouth daily as needed (per instructions).        No Known Allergies  Consultations: Neurosurgery  Procedures/Studies: MR Lumbar Spine W Wo Contrast Result Date: 03/10/2023 CLINICAL DATA:  Lumbar radiculopathy.  Trauma. EXAM: MRI LUMBAR SPINE WITHOUT AND WITH CONTRAST TECHNIQUE: Multiplanar and multiecho pulse sequences of the lumbar spine were obtained without and with intravenous contrast. CONTRAST:  GADAVIST GADOBUTROL 1 MMOL/ML IV SOLN COMPARISON:  None Available. FINDINGS: Segmentation:  Standard. Alignment:  Mild scoliosis and L4-5, L3-4 anterolisthesis. Vertebrae: Low-grade marrow edema in the L5 body with horizontal pattern beneath the mildly depressed superior endplate. Remote and healed T12 superior endplate fracture with mild height loss. No evidence of aggressive bone lesion. Conus medullaris and cauda equina: Conus extends to the L1 level. Conus and  cauda equina appear normal. Paraspinal and other soft tissues: Atrophy of intrinsic back muscles with fatty infiltration. Mild paravertebral edema at the level of fracture. Disc levels: T11-12: Degenerative disc bulging and facet spurring causing biforaminal impingement. Bulging disc crowds the cord without compression. T12- L1: Central and right foraminal protrusion. Asymmetric right facet spurring. Moderate right foraminal stenosis. L1-L2: Mild facet spurring. L2-L3: Disc bulging and moderate facet spurring with ligamentum flavum thickening. L3-L4: Degenerative facet spurring which is bulky on the right. Circumferential disc bulging. Mild to moderate narrowing of the thecal sac. Mild right foraminal narrowing L4-L5: Advanced degenerative facet spurring with anterolisthesis. The disc is narrowed and bulging with right foraminal protrusion. Moderate triangular narrowing the thecal sac. Asymmetric impingement the right subarticular recess with L5 root flattening. Mild to moderate right foraminal stenosis. L5-S1:Disc bulging with endplate and facet spurring eccentric to the left. Mild left foraminal narrowing. Degenerative facet spurring which is asymmetrically bulky on the right. IMPRESSION: 1. Acute or subacute L5 compression fracture with mild superior endplate depression. 2. Remote and healed T12 compression fracture. 3. Generalized lumbar spine degeneration. Degeneration especially affects facets with scoliosis and L3-4, L4-5 anterolisthesis. 4. L4-5 moderate spinal stenosis with impingement at the right more than left subarticular recess. 5. Foraminal impingement bilaterally at T11-12. Electronically Signed   By: Tiburcio Pea M.D.   On: 03/10/2023 08:56  Subjective: No acute issues or events overnight, pain currently well-controlled   Discharge Exam: Vitals:   03/11/23 1411 03/11/23 1538  BP: (!) 96/52 (!) 100/54  Pulse: (!) 52 (!) 50  Resp: 16 18  Temp: 98.6 F (37 C) 98 F (36.7 C)  SpO2:  100% 100%   Vitals:   03/10/23 2152 03/11/23 0526 03/11/23 1411 03/11/23 1538  BP: 111/69 (!) 105/57 (!) 96/52 (!) 100/54  Pulse:  (!) 49 (!) 52 (!) 50  Resp:  18 16 18   Temp:  98 F (36.7 C) 98.6 F (37 C) 98 F (36.7 C)  TempSrc:  Oral Oral Oral  SpO2: 99% 100% 100% 100%  Weight:      Height:        General: Pt is alert, awake, not in acute distress Cardiovascular: RRR, S1/S2 +, no rubs, no gallops Respiratory: CTA bilaterally, no wheezing, no rhonchi Abdominal: Soft, NT, ND, bowel sounds + Extremities: no edema, no cyanosis    The results of significant diagnostics from this hospitalization (including imaging, microbiology, ancillary and laboratory) are listed below for reference.     Microbiology: No results found for this or any previous visit (from the past 240 hours).   Labs: BNP (last 3 results) No results for input(s): "BNP" in the last 8760 hours. Basic Metabolic Panel: Recent Labs  Lab 03/09/23 2053  NA 133*  K 3.9  CL 98  CO2 24  GLUCOSE 109*  BUN 17  CREATININE 0.87  CALCIUM 9.6   Liver Function Tests: No results for input(s): "AST", "ALT", "ALKPHOS", "BILITOT", "PROT", "ALBUMIN" in the last 168 hours. No results for input(s): "LIPASE", "AMYLASE" in the last 168 hours. No results for input(s): "AMMONIA" in the last 168 hours. CBC: Recent Labs  Lab 03/09/23 2053  WBC 4.7  NEUTROABS 3.1  HGB 14.1  HCT 42.5  MCV 95.7  PLT 293   Cardiac Enzymes: No results for input(s): "CKTOTAL", "CKMB", "CKMBINDEX", "TROPONINI" in the last 168 hours. BNP: Invalid input(s): "POCBNP" CBG: No results for input(s): "GLUCAP" in the last 168 hours. D-Dimer No results for input(s): "DDIMER" in the last 72 hours. Hgb A1c No results for input(s): "HGBA1C" in the last 72 hours. Lipid Profile No results for input(s): "CHOL", "HDL", "LDLCALC", "TRIG", "CHOLHDL", "LDLDIRECT" in the last 72 hours. Thyroid function studies Recent Labs    03/10/23 1418  TSH  0.304*   Anemia work up Recent Labs    03/10/23 1418  VITAMINB12 849   Urinalysis    Component Value Date/Time   COLORURINE YELLOW 01/02/2023 1146   APPEARANCEUR CLEAR 01/02/2023 1146   LABSPEC 1.009 01/02/2023 1146   PHURINE 6.0 01/02/2023 1146   GLUCOSEU NEGATIVE 01/02/2023 1146   HGBUR NEGATIVE 01/02/2023 1146   BILIRUBINUR NEGATIVE 01/02/2023 1146   KETONESUR NEGATIVE 01/02/2023 1146   PROTEINUR NEGATIVE 01/02/2023 1146   NITRITE NEGATIVE 01/02/2023 1146   LEUKOCYTESUR NEGATIVE 01/02/2023 1146   Sepsis Labs Recent Labs  Lab 03/09/23 2053  WBC 4.7   Microbiology No results found for this or any previous visit (from the past 240 hours).   Time coordinating discharge: Over 30 minutes  SIGNED:   Azucena Fallen, DO [facility] 03/11/2023, 4:23 PM Pager   If 7PM-7AM, please contact night-coverage [email or web address]

## 2023-03-11 NOTE — Progress Notes (Signed)
Physical Therapy Treatment Patient Details Name: April Douglas MRN: [id number] DOB: 01-13-1934 Today's Date: 03/11/2023   History of Present Illness April Douglas is a 89 y.o. female who presents to the ED for c/o back pain. MRI positive for L5 comp fx, L4-L5 stenosiis and impingement, and T11-T12 impingement. Past medical history of HTN, HLD, PE (on Eliquis), and implanted loop recorder.    PT Comments  Pt with minimal progress with mobility.  Still limited by pain in back as well as pain in shoulders due to increased weight bearing through UE's.  Feel at this time patient will need RW at discharge.  Patient will benefit from continues PT and ambulating during hospitalization and recommend OP PT at discharge to address pain.      If plan is discharge home, recommend the following: A little help with walking and/or transfers;A little help with bathing/dressing/bathroom;Assistance with cooking/housework;Assist for transportation;Help with stairs or ramp for entrance   Can travel by private vehicle        Equipment Recommendations  Rolling walker (2 wheels);BSC/3in1;Wheelchair (measurements PT);Wheelchair cushion (measurements PT)    Recommendations for Other Services       Precautions / Restrictions Precautions Precautions: Fall Restrictions Weight Bearing Restrictions Per Provider Order: No     Mobility  Bed Mobility               General bed mobility comments: in recliner upon arrival.    Transfers Overall transfer level: Needs assistance Equipment used: Rolling walker (2 wheels) Transfers: Sit to/from Stand, Bed to chair/wheelchair/BSC Sit to Stand: Min assist           General transfer comment: Min instructional cueing for hand placement with sit to stand.    Ambulation/Gait Ambulation/Gait assistance: Supervision Gait Distance (Feet): 25 Feet Assistive device: Rolling walker (2 wheels) Gait Pattern/deviations: Trunk flexed, Decreased stance time - right,  Decreased stride length, Step-to pattern, Decreased weight shift to right Gait velocity: decreased     General Gait Details: Very slow step through gait pattern. pt noted to grimace when WBing through RLE and appeared to be almost reluctant to WB.   Stairs             Wheelchair Mobility     Tilt Bed    Modified Rankin (Stroke Patients Only)       Balance Overall balance assessment: Needs assistance Sitting-balance support: No upper extremity supported, Feet supported Sitting balance-Leahy Scale: Good     Standing balance support: Bilateral upper extremity supported, During functional activity, Reliant on assistive device for balance Standing balance-Leahy Scale: Poor                              Cognition Arousal: Alert Behavior During Therapy: WFL for tasks assessed/performed Overall Cognitive Status: Within Functional Limits for tasks assessed                                          Exercises      General Comments        Pertinent Vitals/Pain Pain Assessment Pain Assessment: 0-10 Pain Score: 6  Pain Location: right side low back and right hip Pain Descriptors / Indicators: Aching, Discomfort, Grimacing Pain Intervention(s): Limited activity within patient's tolerance, Monitored during session    Home Living  Prior Function            PT Goals (current goals can now be found in the care plan section) Progress towards PT goals: Progressing toward goals    Frequency    Min 1X/week      PT Plan      Co-evaluation              AM-PAC PT "6 Clicks" Mobility   Outcome Measure  Help needed turning from your back to your side while in a flat bed without using bedrails?: A Little Help needed moving from lying on your back to sitting on the side of a flat bed without using bedrails?: A Little Help needed moving to and from a bed to a chair (including a wheelchair)?: A  Little Help needed standing up from a chair using your arms (e.g., wheelchair or bedside chair)?: A Little Help needed to walk in hospital room?: A Little Help needed climbing 3-5 steps with a railing? : A Lot 6 Click Score: 17    End of Session Equipment Utilized During Treatment: Gait belt Activity Tolerance: Patient limited by pain Patient left: in chair;with call bell/phone within reach;with family/visitor present   PT Visit Diagnosis: Other abnormalities of gait and mobility (R26.89)     Time: 0865-7846 PT Time Calculation (min) (ACUTE ONLY): 25 min  Charges:    $Gait Training: 8-22 mins PT General Charges $$ ACUTE PT VISIT: 1 Visit                     03/11/2023 Delray Alt, PT Acute Rehabilitation Services Office:  [phone]    Olivia Canter 03/11/2023, 12:05 PM

## 2023-03-11 NOTE — Progress Notes (Signed)
Orthopedic Tech Progress Note Patient Details:  April Douglas October 18, 1933 [id number]  Fitted patient for LSO brace and left at bedside. Ortho Devices Type of Ortho Device: Lumbar corsett Ortho Device/Splint Interventions: Ordered, Adjustment, Application   Post Interventions Patient Tolerated: Well Instructions Provided: Care of device, Adjustment of device  Sherilyn Banker 03/11/2023, 6:03 PM

## 2023-03-11 NOTE — TOC Progression Note (Signed)
Transition of Care [facility]) - Progression Note    Patient Details  Name: April Douglas MRN: [id number] Date of Birth: Jan 06, 1934  Transition of Care [facility]) CM/SW Contact  Epifanio Lesches, RN Phone Number: 03/11/2023, 5:13 PM  Clinical Narrative:    Orders noted for home health and DME. Pt agreeable to both services. Pt without provider preference. Referral made with Cory/ [facility] and accepted.  Referral made with Zach/ [facility] for DME : W/C, RW and BSC. Equipment will be delivered to pt's bedside prior to d/c.  [facility] team following for needs...       Expected Discharge Plan and Services   Discharge Planning Services: CM Consult     Expected Discharge Date: 03/11/23               DME Arranged: Dan Humphreys rolling, Community education officer wheelchair with seat cushion, BSC DME Agency: [facility] Date DME Agency Contacted: 03/11/23 Time DME Agency Contacted: [phone] Representative spoke with at DME Agency: Ian Malkin HH Arranged: PT, OT HH Agency: [facility] Date [facility] Agency Contacted: 03/11/23 Time HH Agency Contacted: [phone] Representative spoke with at [facility] Agency: Kandee Keen   Social Determinants of Health (SDOH) Interventions SDOH Screenings   Food Insecurity: No Food Insecurity (03/10/2023)  Housing: Low Risk  (03/10/2023)  Transportation Needs: No Transportation Needs (03/10/2023)  Utilities: Not At Risk (03/10/2023)  Tobacco Use: Low Risk  (03/10/2023)    Readmission Risk Interventions     No data to display

## 2023-03-12 DIAGNOSIS — S32050A Wedge compression fracture of fifth lumbar vertebra, initial encounter for closed fracture: Secondary | ICD-10-CM | POA: Diagnosis not present

## 2023-03-12 NOTE — Progress Notes (Signed)
Discharge instructions given. Patient verbalized understanding and all questions were answered.  ?

## 2023-03-12 NOTE — Plan of Care (Signed)

## 2023-04-18 ENCOUNTER — Encounter: Payer: Self-pay | Admitting: Cardiology

## 2023-04-20 ENCOUNTER — Encounter (HOSPITAL_BASED_OUTPATIENT_CLINIC_OR_DEPARTMENT_OTHER): Payer: Self-pay | Admitting: Emergency Medicine

## 2023-04-20 ENCOUNTER — Other Ambulatory Visit: Payer: Self-pay

## 2023-04-20 ENCOUNTER — Emergency Department (HOSPITAL_BASED_OUTPATIENT_CLINIC_OR_DEPARTMENT_OTHER)
Admission: EM | Admit: 2023-04-20 | Discharge: 2023-04-20 | Disposition: A | Discharge status: Home / Self Care | Payer: Medicare Other | Attending: Emergency Medicine | Admitting: Emergency Medicine

## 2023-04-20 ENCOUNTER — Emergency Department (HOSPITAL_BASED_OUTPATIENT_CLINIC_OR_DEPARTMENT_OTHER): Payer: Medicare Other | Admitting: Radiology

## 2023-04-20 ENCOUNTER — Emergency Department (HOSPITAL_BASED_OUTPATIENT_CLINIC_OR_DEPARTMENT_OTHER): Payer: Medicare Other

## 2023-04-20 DIAGNOSIS — M25561 Pain in right knee: Secondary | ICD-10-CM | POA: Diagnosis present

## 2023-04-20 DIAGNOSIS — M25461 Effusion, right knee: Secondary | ICD-10-CM | POA: Insufficient documentation

## 2023-04-20 DIAGNOSIS — M7989 Other specified soft tissue disorders: Secondary | ICD-10-CM | POA: Insufficient documentation

## 2023-04-20 DIAGNOSIS — I10 Essential (primary) hypertension: Secondary | ICD-10-CM | POA: Insufficient documentation

## 2023-04-20 MED ORDER — METHOCARBAMOL 500 MG PO TABS
500.0000 mg | ORAL_TABLET | Freq: Three times a day (TID) | ORAL | 0 refills | Status: DC | PRN
Start: 2023-04-20 — End: 2023-07-18

## 2023-04-20 MED ORDER — TRAMADOL HCL 50 MG PO TABS: 50.0000 mg | ORAL_TABLET | Freq: Four times a day (QID) | ORAL | 0 refills | Status: DC | PRN

## 2023-04-20 NOTE — ED Provider Notes (Incomplete)
Emergency Department Provider Note   I have reviewed the triage vital signs and the nursing notes.   HISTORY  Chief Complaint Knee Pain   HPI April Douglas is a 89 y.o. female ***   {**SYMPTOM/COMPLAINT  LOCATION (describe anatomically) DURATION (when did it start) TIMING (onset and pattern) SEVERITY (0-10, mild/moderate/severe) QUALITY (description of symptoms) CONTEXT (recent surgery, new meds, activity, etc.) MODIFYINGFACTORS (what makes it better/worse) ASSOCIATEDSYMPTOMS (pertinent positives and negatives)**}  Past Medical History:  Diagnosis Date   Dehydration    Dizziness    Elevated serum creatinine    Encounter for loop recorder check 06/27/2019   Glaucoma    Hyperlipidemia    Hypertension    Loop recorder Biotronik 05/21/19 06/08/2019   Pulmonary embolism (HCC)    01/02/2023   Right bundle branch block    Seasonal affective disorder (HCC)    Syncope and collapse 10/23/2018    Review of Systems {** Revise as appropriate then delete this line - Documentation of 10 systems OR 2 systems and "10-point ROS otherwise negative" is required **}Constitutional: No fever/chills Eyes: No visual changes. ENT: No sore throat. Cardiovascular: Denies chest pain. Respiratory: Denies shortness of breath. Gastrointestinal: No abdominal pain.  No nausea, no vomiting.  No diarrhea.  No constipation. Genitourinary: Negative for dysuria. Musculoskeletal: Negative for back pain. Skin: Negative for rash. Neurological: Negative for headaches, focal weakness or numbness. {**Psychiatric:  Endocrine:  Hematological/Lymphatic:  Allergic/Immunilogical: **}  ____________________________________________   PHYSICAL EXAM:  VITAL SIGNS: ED Triage Vitals  Encounter Vitals Group     BP 04/20/23 1141 (!) 163/88     Systolic BP Percentile --      Diastolic BP Percentile --      Pulse Rate 04/20/23 1141 (!) 54     Resp 04/20/23 1141 12     Temp 04/20/23 1141 98.9 F  (37.2 C)     Temp Source 04/20/23 1141 Oral     SpO2 04/20/23 1141 96 %     Weight --      Height --      Head Circumference --      Peak Flow --      Pain Score 04/20/23 1145 8     Pain Loc --      Pain Education --      Exclude from Growth Chart --    {** Revise as appropriate then delete this line - 8 systems required **} Constitutional: Alert and oriented. Well appearing and in no acute distress. Eyes: Conjunctivae are normal. PERRL. EOMI. Head: Atraumatic. {**Ears:  Healthy appearing ear canals and TMs bilaterally **}Nose: No congestion/rhinnorhea. Mouth/Throat: Mucous membranes are moist.  Oropharynx non-erythematous. Neck: No stridor.  No meningeal signs.  {**No cervical spine tenderness to palpation.**} Cardiovascular: Normal rate, regular rhythm. Good peripheral circulation. Grossly normal heart sounds.   Respiratory: Normal respiratory effort.  No retractions. Lungs CTAB. Gastrointestinal: Soft and nontender. No distention.  {**Genitourinary:  **}Musculoskeletal: No lower extremity tenderness nor edema. No gross deformities of extremities. Neurologic:  Normal speech and language. No gross focal neurologic deficits are appreciated.  Skin:  Skin is warm, dry and intact. No rash noted. {**Psychiatric: Mood and affect are normal. Speech and behavior are normal.**}  ____________________________________________   LABS (all labs ordered are listed, but only abnormal results are displayed)  Labs Reviewed - No data to display ____________________________________________  EKG  *** ____________________________________________  RADIOLOGY  US Venous Img Lower Right (DVT Study) Result Date: 04/20/2023 CLINICAL DATA:  Acute right  knee pain. EXAM: Right LOWER EXTREMITY VENOUS DOPPLER ULTRASOUND TECHNIQUE: Gray-scale sonography with compression, as well as color and duplex ultrasound, were performed to evaluate the deep venous system(s) from the level of the common femoral vein  through the popliteal and proximal calf veins. COMPARISON:  None Available. FINDINGS: VENOUS Normal compressibility of the common femoral, superficial femoral, and popliteal veins, as well as the visualized calf veins. Visualized portions of profunda femoral vein and great saphenous vein unremarkable. No filling defects to suggest DVT on grayscale or color Doppler imaging. Doppler waveforms show normal direction of venous flow, normal respiratory plasticity and response to augmentation. Limited views of the contralateral common femoral vein are unremarkable. OTHER None. Limitations: none IMPRESSION: Negative. Electronically Signed   By: Lupita Raider M.D.   On: 04/20/2023 13:36   DG Knee Complete 4 Views Right Result Date: 04/20/2023 CLINICAL DATA:  Right knee pain and calf swelling EXAM: RIGHT KNEE - COMPLETE 4+ VIEW COMPARISON:  None Available. FINDINGS: Tricompartmental spurring loss of articular space especially in the patellofemoral joint. Moderate to large knee joint effusion. No fracture or acute bony findings. Mild vascular calcifications in distal SFA. IMPRESSION: 1. Tricompartmental osteoarthritis, worst in the patellofemoral joint. 2. Moderate to large knee joint effusion. 3. Mild vascular calcifications in the distal SFA. Electronically Signed   By: Gaylyn Rong M.D.   On: 04/20/2023 13:06    ____________________________________________   PROCEDURES  Procedure(s) performed:   Procedures   ____________________________________________   INITIAL IMPRESSION / ASSESSMENT AND PLAN / ED COURSE  Pertinent labs & imaging results that were available during my care of the patient were reviewed by me and considered in my medical decision making (see chart for details).   This patient is Presenting for Evaluation of ***, which {Range:23949} require a range of treatment options, and {MDMcomplaint:23950} a complaint that involves a {MDMlevelrisk:23951} risk of morbidity and  mortality.  The Differential Diagnoses include***.  Critical Interventions-    Medications - No data to display  Reassessment after intervention:     I *** Additional Historical Information from ***, as the patient is ***.  I decided to review pertinent External Data, and in summary ***.   Clinical Laboratory Tests Ordered, included   Radiologic Tests Ordered, included ***. I independently interpreted the images and agree with radiology interpretation.   Cardiac Monitor Tracing which shows ***   Social Determinants of Health Risk ***  Consult complete with  Medical Decision Making: Summary: ***  Reevaluation with update and discussion with   ***Considered admission***  Patient's presentation is most consistent with {EM COPA:27473}   Disposition:   ____________________________________________  FINAL CLINICAL IMPRESSION(S) / ED DIAGNOSES  Final diagnoses:  None     NEW OUTPATIENT MEDICATIONS STARTED DURING THIS VISIT:  New Prescriptions   No medications on file    Note:  This document was prepared using Dragon voice recognition software and may include unintentional dictation errors.  Alona Bene, MD, [facility] Emergency Medicine

## 2023-04-20 NOTE — Discharge Instructions (Signed)
You were seen in the emerged from today with knee pain.  There is some fluid on the knee but it does not appear infected.  He should return to the emergency department immediately if you develop any fevers or redness to the skin overlying the knee.  You may also follow-up with the orthopedic doctors if more mild worsening pain symptoms develop.  I have prescribed 2 medications to use for severe, breakthrough pain.  Please do not take them at the same time.  You may start with Robaxin to see if this gets relief.  Tramadol is another option but I would not take it within 12 hours of taking Robaxin.  Both of these medications have the potential to cause some confusion and difficulty with walking.  Please use with caution.

## 2023-04-20 NOTE — ED Triage Notes (Signed)
Right knee/calf swollen today. Pt had L5 surgery in December and has been dealing with pain in this leg but today pain is worse and is swollen and warm.

## 2023-04-21 NOTE — Telephone Encounter (Signed)
Thank you for the update.  As long as her SBP's are 130 mmHg hold off on blood pressure medications.  However if the numbers are greater than 130 mmHg start taking olmesartan once a day as prescribed.  If additional medication is needed then I will restart amlodipine at 2.5 mg p.o. daily  Call if questions or concerns arise.  Trai Ells [place], DO, [facility]

## 2023-04-24 ENCOUNTER — Telehealth: Payer: Self-pay | Admitting: Cardiology

## 2023-04-24 NOTE — Telephone Encounter (Signed)
 Spoke with Josiah Nigh from [place] and went over the pt message encounter from 1/31 from pt's daughter Prentice Brochure and Dr. Jesus Morones recommendations. Josiah Nigh verbalized understanding and had no further question.

## 2023-04-24 NOTE — Telephone Encounter (Signed)
 Pt c/o medication issue:  1. Name of Medication: Amlodipine , and Hydrazaline,   2. How are you currently taking this medication (dosage and times per day)?   3. Are you having a reaction (difficulty breathing--STAT)?   4. What is your medication issue? Signe say he wants to confirm that these medicine were discontinued   Pt c/o medication issue:  1. Name of Medication: Olmesartan   2. How are you currently taking this medication (dosage and times per day)?   3. Are you having a reaction (difficulty breathing--STAT)?   4. What is your medication issue? He wants to make sure that this is the right directions for this medicine-  as needed when systolic is 130 or more consistently

## 2023-04-25 ENCOUNTER — Other Ambulatory Visit: Payer: Self-pay | Admitting: Cardiology

## 2023-04-25 DIAGNOSIS — I10 Essential (primary) hypertension: Secondary | ICD-10-CM

## 2023-05-06 ENCOUNTER — Telehealth: Payer: Self-pay | Admitting: Cardiology

## 2023-05-06 NOTE — Telephone Encounter (Signed)
 Left message to call back

## 2023-05-06 NOTE — Telephone Encounter (Signed)
Prior to exercise HR 48 with no symptoms, calling to see if the perimeter needs to change. Please advise

## 2023-05-08 NOTE — Telephone Encounter (Signed)
Spoke with Rosanne Ashing from [place] and he had stated that while working with the pt they have to call and let us know if the pt's HR is out of the range of 50-100. He asked if we need to change those parameters, advised that at this time, to keep the same parameters. He also gave updated BP readings from today listed below. Explained that we will send this readings to Dr. Odis Hollingshead to review. Rosanne Ashing asked that we call the pt's daughter Shawna Orleans with any updates regarding BP.  Beginning of visit: 150/96 At rest: 130/90 After exercise: 170/100 At end of visit: 140/90  HR stayed in mid to upper 50s

## 2023-05-08 NOTE — Telephone Encounter (Signed)
If asymptomatic - nothing to do.  Not on AV nodal agents.  Is her ILR still being monitored / active.   April Kings [place], DO, [facility]

## 2023-05-08 NOTE — Telephone Encounter (Signed)
Attempted to call Rosanne Ashing with [facility] to clarify his message he called about the other day and go over recommendations from Dr. Odis Hollingshead. LMTCB to discuss further.

## 2023-05-09 NOTE — Telephone Encounter (Signed)
Attempted to call pt's daughter and go over Dr. Emelda Brothers response about pt's BP reading with [facility] PT the other day. LVM to call our office on Monday.

## 2023-05-09 NOTE — Telephone Encounter (Signed)
Her HR around 50 bpm is likely intrinsic as she is not on AV nodal agents as per the last office note. Pls reconcile her home meds.   Given her age SBP up to is acceptable.  This is just one reading.  If her resting / baseline SBP is consistently greater than we can increase Norvasc to 5 mg po qday. But I would start by trending the readings.   Linetta Regner [place], DO, [facility]

## 2023-05-11 ENCOUNTER — Encounter: Payer: Self-pay | Admitting: Cardiology

## 2023-05-12 ENCOUNTER — Other Ambulatory Visit: Payer: Self-pay

## 2023-05-12 NOTE — Telephone Encounter (Signed)
 Harlow Ohms,   Please update the MAR if needed.  I am hopeful that the blood pressures will be better controlled as her pain improves.  She can use amlodipine 2.5 mg p.o. daily for systolic blood pressures consistently greater than 140 mmHg.  If blood pressures are lower than 140 mmHg I would avoid amlodipine to prevent hypotension.  Jaxten Brosh [place], DO, [facility]

## 2023-05-14 ENCOUNTER — Telehealth: Payer: Self-pay | Admitting: Cardiology

## 2023-05-14 NOTE — Telephone Encounter (Signed)
 Spoke with daughter Shawna Orleans and she states her mom BP has been elevated in the 150-160's so along with her olmesartan she has been giving her amlodipine. This morning it was 156/87 and she gave her just amlodipine 2.5 mg. Nurse form [facility] came at 1030 and her BP was 160/96  Daughter states she is not sure hoe to handle her BP because some days its low without Its a medication. Her BP is all over the place so its hard to medicate her. She has not had any significant pain. She takes amlodipine 2.5 mg in the am and olmesartan at night.

## 2023-05-14 NOTE — Telephone Encounter (Signed)
 New Message:    Rosanne Ashing from [place] is calling with some blood pressure readings. He said you can reach out to her daughter, Shawna Orleans.  Pt c/o BP issue:  1. What are your last 5 BP readings? 160/90 sitting down  160/96 standing  2. Are you having any other symptoms (ex. Dizziness, headache, blurred vision, passed out)? No symptoms  3. What is your medication issue? He says this blood pressure  is high for patient

## 2023-05-15 NOTE — Telephone Encounter (Signed)
 Please refer to the pt message encounter from 05/11/23.

## 2023-05-16 NOTE — Telephone Encounter (Signed)
 Last office note also noted hydralazine - is the taking this medication.   Are the BP reading done in controlled environment - rested, seated / sleeping position, and around the same time.   April Douglas [place], DO, [facility]

## 2023-05-19 NOTE — Telephone Encounter (Signed)
 Pt daughter called in stating pt bp has been running high in the mornings. She states she takes olmesartan at night and amlodipine in the morning and she would like to know if its okay for pt to take both in the morning. Please advise.

## 2023-05-22 NOTE — Telephone Encounter (Signed)
 Please call the daughter we as discussed - and will readdress it later today.   Layani Foronda [place], DO, [facility]

## 2023-05-23 ENCOUNTER — Other Ambulatory Visit: Payer: Self-pay

## 2023-05-23 DIAGNOSIS — I10 Essential (primary) hypertension: Secondary | ICD-10-CM

## 2023-05-23 NOTE — Telephone Encounter (Signed)
 If her blood pressures are around 143 mmHg I would hold off on starting hydralazine. Please have her come in to see Pharm.D for blood pressure medication titration.  Please remind the patient to bring the caridac medication bottles at the next office visit to help facilitate a more accurate medication reconciliation as well as an ambulatory blood pressure log.  Dearius Hoffmann [place], DO, [facility]

## 2023-05-23 NOTE — Telephone Encounter (Signed)
 Spoke with pt's daughter and explained that we will be placing a referral to PharmD for BP management. Pt's daughter verbalized understanding and had no further questions.

## 2023-06-23 ENCOUNTER — Other Ambulatory Visit: Payer: Self-pay | Admitting: Cardiology

## 2023-07-18 ENCOUNTER — Ambulatory Visit: Attending: Cardiology | Admitting: Pharmacist

## 2023-07-18 DIAGNOSIS — I10 Essential (primary) hypertension: Secondary | ICD-10-CM | POA: Insufficient documentation

## 2023-07-18 MED ORDER — AMLODIPINE BESYLATE 5 MG PO TABS: 5.0000 mg | ORAL_TABLET | Freq: Every day | ORAL | 3 refills | Status: AC

## 2023-07-18 NOTE — Patient Instructions (Addendum)
 Your blood pressure goal is < 130/43mmHg   Please decrease olmesartan  to 20mg  daily (you may take 1/2 tablet of olmesartan  40mg ). Continue amlodipine  5mg  daily.  Please continue to monitor blood pressure at home Please send me blood pressure readings via mychart in 2 weeks   Important lifestyle changes to control high blood pressure  Intervention  Effect on the BP   Weight loss Weight loss is one of the most effective lifestyle changes for controlling blood pressure. If you're overweight or obese, losing even a small amount of weight can help reduce blood pressure.    Blood pressure can decrease by 1 millimeter of mercury (mmHg) with each kilogram (about 2.2 pounds) of weight lost.   Exercise regularly As a general goal, aim for 30 minutes of moderate physical activity every day.    Regular physical activity can lower blood pressure by 5 - 8 mmHg.   Eat a healthy diet Eat a diet rich in whole grains, fruits, vegetables, lean meat, and low-fat dairy products. Limit processed foods, saturated fat, and sweets.    A heart-healthy diet can lower high blood pressure by 10 mmHg.   Reduce salt (sodium) in your diet Aim for 000mg  of sodium each day. Avoid deli meats, canned food, and frozen microwave meals which are high in sodium.     Limiting sodium can reduce blood pressure by 5 mmHg.   Limit alcohol  One drink equals 12 ounces of beer, 5 ounces of wine, or 1.5 ounces of 80-proof liquor.    Limiting alcohol  to < 1 drink a day for women or < 2 drinks a day for men can help lower blood pressure by about 4 mmHg.   To check your pressure at home you will need to:   Sit up in a chair, with feet flat on the floor and back supported. Do not cross your ankles or legs. Rest your left arm so that the cuff is about heart level. If the cuff goes on your upper arm, then just relax your arm on the table, arm of the chair, or your lap. If you have a wrist cuff, hold your wrist against your  chest at heart level. Place the cuff snugly around your arm, about 1 inch above the crease of your elbow. The cords should be inside the groove of your elbow.  Sit quietly, with the cuff in place, for about 5 minutes. Then press the power button to start a reading. Do not talk or move while the reading is taking place.  Record your readings on a sheet of paper. Although most cuffs have a memory, it is often easier to see a pattern developing when the numbers are all in front of you.  You can repeat the reading after 1-3 minutes if it is recommended.   Make sure your bladder is empty and you have not had caffeine or tobacco within the last 30 minutes   Always bring your blood pressure log with you to your appointments. If you have not brought your monitor in to be double checked for accuracy, please bring it to your next appointment.   You can find a list of validated (accurate) blood pressure cuffs at: validatebp.org

## 2023-07-18 NOTE — Assessment & Plan Note (Signed)
 Assessment: Blood pressure 110/60 in clinic today Heart rate in the 50s which is typical for patient-she is not on any nodal blockers She has been taking amlodipine  5 mg instead of 2.5 mg for a few months now Given her age and history of syncope I agree that lower blood pressure readings are of some concern.  Especially if patient's feeling drained and tired Her slightly elevated blood pressures in the morning tend to improve without intervention Daughter is in the healthcare field and has validated home blood pressure cuff  Plan: Will try decreasing olmesartan  to 20 mg daily to see if this helps keep blood pressure a little bit higher in the evening Family to send me blood pressure readings in 2 weeks

## 2023-07-18 NOTE — Progress Notes (Signed)
 Patient ID: April Douglas                 DOB: February 02, 1934                      MRN: [id number]      HPI: April Douglas is a 89 y.o. female referred by Dr. Albert Huff to HTN clinic. PMH is significant for HTN, hyperlipidemia, history of syncope, postmenopausal female. Patient's daughter has been messaging back and forth for a few months about her mom's blood pressure.  Patient presents today accompanied by her daughter and husband.  Husband checks her blood pressure in the morning, sometimes in the afternoon and evening.  The concern is that her evening blood pressures have been on the lower end.  Concerned about her history of syncope.  Patient also reports feeling drained and tired when her blood pressure is low.  She denies any dizziness, stands up very slowly.  Some mild swelling in her legs.  Blood pressure tends to start slightly elevated in the mornings and trending downwards as the day goes on.  Recently has had a few readings of 100/51 and 104/50.  Patient gets up around 7 30-8 o'clock and husband checks blood pressure around 830.  Previously he was repeating reading if elevated, but not recently.  Current HTN meds: amlodipine  5mg  daily, olmesartan  40mg  daily Previously tried: hydralazine  BP goal: per Dr. Albert Huff <140 systolic is acceptable <140/90  Family History:  Family History  Problem Relation Age of Onset   Hypertension Mother    Hypertension Father    Stroke Sister    Cancer Brother    Heart disease Brother    Cancer Brother    Hypertension Sister    Breast cancer Neg Hx     Social History:  Social History   Socioeconomic History   Marital status: Married    Spouse name: Not on file   Number of children: 2   Years of education: some college   Highest education level: Not on file  Occupational History    Comment: retired  Tobacco Use   Smoking status: Never   Smokeless tobacco: Never  Vaping Use   Vaping status: Never Used  Substance and Sexual  Activity   Alcohol  use: No   Drug use: No   Sexual activity: Not Currently  Other Topics Concern   Not on file  Social History Narrative   Caffeine- coffee, 1 daily.  Lives home with husband-[name].  Education 3 yrs college.  Retired.     Social Drivers of Corporate investment banker Strain: Not on file  Food Insecurity: No Food Insecurity (03/10/2023)   Hunger Vital Sign    Worried About Running Out of Food in the Last Year: Never true    Ran Out of Food in the Last Year: Never true  Transportation Needs: No Transportation Needs (03/10/2023)   PRAPARE - Administrator, Civil Service (Medical): No    Lack of Transportation (Non-Medical): No  Physical Activity: Not on file  Stress: Not on file  Social Connections: Not on file  Intimate Partner Violence: Not At Risk (03/10/2023)   Humiliation, Afraid, Rape, and Kick questionnaire    Fear of Current or Ex-Partner: No    Emotionally Abused: No    Physically Abused: No    Sexually Abused: No    Diet: Not reviewed today  Exercise: Walks around the house with her walker   Home BP readings:  100/51 (didn't give olmesartan ) 158/72 next am  137/64, 163/70 116/60 129/66 104/50 136/71 104/51 130/64 166/80, 160/65, 155/72, 143/75, 118/59, 153/74  Wt Readings from Last 3 Encounters:  03/09/23 142 lb (64.4 kg)  01/06/23 142 lb 6.7 oz (64.6 kg)  01/03/23 142 lb 6.7 oz (64.6 kg)   BP Readings from Last 3 Encounters:  07/18/23 110/60  04/20/23 (!) 163/88  03/12/23 91/60   Pulse Readings from Last 3 Encounters:  07/18/23 (!) 53  04/20/23 (!) 54  03/12/23 (!) 48    Renal function: CrCl cannot be calculated (Patient's most recent lab result is older than the maximum 21 days allowed.).  Past Medical History:  Diagnosis Date   Dehydration    Dizziness    Elevated serum creatinine    Encounter for loop recorder check 06/27/2019   Glaucoma    Hyperlipidemia    Hypertension    Loop recorder Biotronik 05/21/19  06/08/2019   Pulmonary embolism (HCC)    01/02/2023   Right bundle branch block    Seasonal affective disorder (HCC)    Syncope and collapse 10/23/2018    Current Outpatient Medications on File Prior to Visit  Medication Sig Dispense Refill   calcitonin, salmon, (MIACALCIN /FORTICAL) 200 UNIT/ACT nasal spray Place 1 spray into alternate nostrils in the morning.     carboxymethylcellulose (REFRESH PLUS) 0.5 % SOLN Place 1 drop into both eyes 2 (two) times daily as needed (dry eyes).     cetirizine (ZYRTEC) 10 MG tablet Take 1 tablet by mouth daily as needed for allergies or rhinitis.     Cholecalciferol (VITAMIN D3) 50 MCG (2000 UT) CHEW Chew 1 Dose by mouth daily as needed (per instructions).     donepezil  (ARICEPT ) 5 MG tablet Take 5 mg by mouth at bedtime.     ELIQUIS  5 MG TABS tablet Take 5 mg by mouth 2 (two) times daily.     estradiol (ESTRACE) 0.1 MG/GM vaginal cream 1 Applicatorful once a week. Medication is 0.5/gm Takes two times per week     fluticasone  (FLONASE ) 50 MCG/ACT nasal spray Place 2 sprays into both nostrils at bedtime as needed for allergies or rhinitis (Takes at night when taking Zyrtec).     gabapentin (NEURONTIN) 300 MG capsule Take 300 mg by mouth 3 (three) times daily.     memantine  (NAMENDA ) 10 MG tablet Take 1 tablet by mouth 2 (two) times daily.     methocarbamol  (ROBAXIN ) 750 MG tablet Take 750 mg by mouth 3 (three) times daily.     Multiple Vitamin (MULTIVITAMIN WITH MINERALS) TABS tablet Take 1 tablet by mouth daily as needed (Takes when she remembers).     pregabalin (LYRICA) 50 MG capsule Take 50 mg by mouth 2 (two) times daily.     acetaminophen  (TYLENOL ) 500 MG tablet Take 1,000 mg by mouth 3 (three) times daily.     CALCIUM CARB-CHOLECALCIFEROL PO Take 1 tablet by mouth daily.     cycloSPORINE  (RESTASIS ) 0.05 % ophthalmic emulsion 1 drop 2 (two) times daily.     hydrALAZINE  (APRESOLINE ) 100 MG tablet Take 1 tablet (100 mg total) by mouth 2 (two) times  daily. TAKE 1 TABLET(100 MG) BY MOUTH THREE TIMES DAILY (Patient taking differently: Take 1 tablet by mouth 2 (two) times daily.) 270 tablet 1   Menthol, Topical Analgesic, (BIOFREEZE ROLL-ON) 4 % GEL Apply 1 Application topically 2 (two) times daily as needed (For arthritis pain).     olmesartan  (BENICAR ) 40 MG tablet TAKE 1/2 TABLET(20  MG) BY MOUTH EVERY EVENING     Omega-3 Fatty Acids (SALMON OIL-1000 PO) Take 1 capsule by mouth daily as needed (as instructed).     No current facility-administered medications on file prior to visit.    No Known Allergies  Blood pressure 110/60, pulse (!) 53.   Assessment/Plan:     1. Hypertension -  Essential hypertension Assessment: Blood pressure 110/60 in clinic today Heart rate in the 50s which is typical for patient-she is not on any nodal blockers She has been taking amlodipine  5 mg instead of 2.5 mg for a few months now Given her age and history of syncope I agree that lower blood pressure readings are of some concern.  Especially if patient's feeling drained and tired Her slightly elevated blood pressures in the morning tend to improve without intervention Daughter is in the healthcare field and has validated home blood pressure cuff  Plan: Will try decreasing olmesartan  to 20 mg daily to see if this helps keep blood pressure a little bit higher in the evening Family to send me blood pressure readings in 2 weeks   Thank you  Elma Shands D Tavaras Goody, Pharm.Monika Annas, CPP [facility] A Division of [facility] [address] [place], [place] [address]  Phone: [phone]; Fax: [phone]

## 2023-08-03 ENCOUNTER — Encounter: Payer: Self-pay | Admitting: Pharmacist

## 2023-08-28 NOTE — Progress Notes (Signed)
 OFFICE NOTE Date:  08/29/2023  ID:  April Douglas, DOB 04/24/1933, MRN [id number] PCP: April Hurt, FNP  April Douglas Providers Cardiologist:  April Bertrand, DO     Patient Profile:      April Douglas  MPI 02/11/18: low risk Hypertension  Hyperlipidemia Right Bundle Branch Block   Hx of syncope TTE 01/05/18 [facility]): EF 60-65, no RWMA, trivial TR S/p ILR  Monitor 01/2019: no heart block, AFib TTE 02/03/23: EF 65-70, no RWMA, NL RVSF, NL PASP, RVSP 26.4, trivial MR, trival AI, RAP 3   Hx of pulmonary embolism 12/2022 Lumbar compression Fx ?NPH        Discussed the use of AI scribe software for clinical note transcription with the patient, who gave verbal consent to proceed. History of Present Illness April Douglas is a 89 y.o. female who returns for follow up of syncope. She was last seen by Dr. Albert Douglas in 09/2022. She was recently seen in our PharmD HTN clinic for management of BP.   She presents with a recent syncopal episode. She is accompanied by her husband and daughter, who is a Engineer, civil (consulting). She experienced a syncopal episode on May 31st while sitting on a stool in the kitchen during a conversation with her husband. She does not recall the event clearly but remembers feeling weak and disoriented upon waking. EMS was called, but she was not transported to the hospital. This episode is similar to previous syncopal events, with the last one occurring approximately two years ago. Since the episode, she feels cognitively slower and weaker, noting that it takes a moment to recall things, and she is being extra careful to write things down. She uses a walker at home and is cautious with her movements. She has not had chest discomfort, shortness of breath, or unusual swelling. She reports occasional indigestion. She has noticed some swelling in her legs and feet over the past couple of months, which does not improve with elevation.   ROS-See HPI    Studies  Reviewed:      Results  Risk Assessment/Calculations:          Physical Exam:  VS:  BP 138/80   Pulse 69   Ht 5' 5 (1.651 m)   SpO2 98%   BMI 23.63 kg/m    Wt Readings from Last 3 Encounters:  03/09/23 142 lb (64.4 kg)  01/06/23 142 lb 6.7 oz (64.6 kg)  01/03/23 142 lb 6.7 oz (64.6 kg)    Constitutional:      Appearance: Healthy appearance. Not in distress.  Pulmonary:     Breath sounds: Normal breath sounds. No wheezing. No rales.  Cardiovascular:     Normal rate. Regular rhythm.     Murmurs: There is no murmur.  Edema:    Peripheral edema present.    Pretibial: bilateral trace edema of the pretibial area. Abdominal:     Palpations: Abdomen is soft.  Skin:    General: Skin is warm and dry.         Assessment and Plan: Assessment & Plan Syncope and collapse She experienced a recent syncopal episode on Aug 16, 2023, while sitting. This is the fourth episode in two years, with the last occurring two years ago. Her ILR was turned off a couple of years ago. It was implanted in 2021 and not likely functional. EKG obtained by EMS on the date she had her syncopal episode was brought in by the family today. It was done  on 08/16/23 and was personally reviewed by me today (08/29/23) and showed sinus brady, HR 54, RBBB. She does take Aricept , which can slow heart rate. EF was normal on TTE in 01/2023.  - 30 day event monitor  - She does not drive - Follow up 6-96 weeks Essential hypertension Blood pressure controlled. -Continue amlodipine  5 mg daily, olmesartan  40 mg daily. History of pulmonary embolism She was admitted in October 2024 with pulmonary embolism.  She is fairly immobile with prior history of compression fracture.  It is been decided to continue her on anticoagulation with Eliquis .   - As she is on anticoagulation and a recent syncopal spell I will check a CBC, BMET today.      Dispo:  Return in about 8 weeks (around 10/24/2023) for Follow up after testing, w/ Dr.  Albert Douglas, or April Single, PA-C. Signed, April Single, PA-C

## 2023-08-29 ENCOUNTER — Ambulatory Visit: Attending: Physician Assistant | Admitting: Physician Assistant

## 2023-08-29 ENCOUNTER — Encounter: Payer: Self-pay | Admitting: Physician Assistant

## 2023-08-29 VITALS — BP 138/80 | HR 69 | Ht 65.0 in

## 2023-08-29 DIAGNOSIS — R55 Syncope and collapse: Secondary | ICD-10-CM | POA: Insufficient documentation

## 2023-08-29 DIAGNOSIS — I10 Essential (primary) hypertension: Secondary | ICD-10-CM | POA: Insufficient documentation

## 2023-08-29 DIAGNOSIS — Z86711 Personal history of pulmonary embolism: Secondary | ICD-10-CM | POA: Insufficient documentation

## 2023-08-29 NOTE — Assessment & Plan Note (Signed)
 She experienced a recent syncopal episode on Aug 16, 2023, while sitting. This is the fourth episode in two years, with the last occurring two years ago. Her ILR was turned off a couple of years ago. It was implanted in 2021 and not likely functional. EKG obtained by EMS on the date she had her syncopal episode was brought in by the family today. It was done on 08/16/23 and was personally reviewed by me today (08/29/23) and showed sinus brady, HR 54, RBBB. She does take Aricept , which can slow heart rate. EF was normal on TTE in 01/2023.  - 30 day event monitor  - She does not drive - Follow up 1-32 weeks

## 2023-08-29 NOTE — Patient Instructions (Signed)
 Medication Instructions:  No medication changes were made during today's visit.  *If you need a refill on your cardiac medications before your next appointment, please call your pharmacy*   Lab Work: Your blood will be drawn today. If you have labs (blood work) drawn today and your tests are completely normal, you will receive your results only by: MyChart Message (if you have MyChart) OR A paper copy in the mail If you have any lab test that is abnormal or we need to change your treatment, we will call you to review the results.   Testing/Procedures: Your physician has recommended that you wear an event monitor. Event monitors are medical devices that record the heart's electrical activity. Doctors most often us  these monitors to diagnose arrhythmias. Arrhythmias are problems with the speed or rhythm of the heartbeat. The monitor is a small, portable device. You can wear one while you do your normal daily activities. This is usually used to diagnose what is causing palpitations/syncope (passing out).    Follow-Up: At [facility], you and your health needs are our priority.  As part of our continuing mission to provide you with exceptional heart care, we have created designated Provider Care Teams.  These Care Teams include your primary Cardiologist (physician) and Advanced Practice Providers (APPs -  Physician Assistants and Nurse Practitioners) who all work together to provide you with the care you need, when you need it.  We recommend signing up for the patient portal called MyChart.  Sign up information is provided on this After Visit Summary.  MyChart is used to connect with patients for Virtual Visits (Telemedicine).  Patients are able to view lab/test results, encounter notes, upcoming appointments, etc.  Non-urgent messages can be sent to your provider as well.   To learn more about what you can do with MyChart, go to [email or web address].    Your next appointment:    8-12 week(s)  Provider:   Dr. Olinda Bertrand    Other Instructions Thank you for choosing [facility]!

## 2023-08-29 NOTE — Assessment & Plan Note (Signed)
 Blood pressure controlled. -Continue amlodipine  5 mg daily, olmesartan  40 mg daily.

## 2023-08-30 ENCOUNTER — Ambulatory Visit: Payer: Self-pay | Admitting: Physician Assistant

## 2023-08-30 LAB — BASIC METABOLIC PANEL WITH GFR
BUN/Creatinine Ratio: 19 (ref 12–28)
BUN: 14 mg/dL (ref 8–27)
CO2: 23 mmol/L (ref 20–29)
Calcium: 9.4 mg/dL (ref 8.7–10.3)
Chloride: 98 mmol/L (ref 96–106)
Creatinine, Ser: 0.73 mg/dL (ref 0.57–1.00)
Glucose: 72 mg/dL (ref 70–99)
Potassium: 4.7 mmol/L (ref 3.5–5.2)
Sodium: 137 mmol/L (ref 134–144)
eGFR: 80 mL/min/{1.73_m2} (ref >59)

## 2023-08-30 LAB — CBC
Hematocrit: 42.2 % (ref 34.0–46.6)
Hemoglobin: 13.7 g/dL (ref 11.1–15.9)
MCH: 32.6 pg (ref 26.6–33.0)
MCHC: 32.5 g/dL (ref 31.5–35.7)
MCV: 101 fL — ABNORMAL HIGH (ref 79–97)
Platelets: 232 10*3/uL (ref 150–450)
RBC: 4.2 x10E6/uL (ref 3.77–5.28)
RDW: 11.7 % (ref 11.7–15.4)
WBC: 4.8 10*3/uL (ref 3.4–10.8)

## 2023-10-06 ENCOUNTER — Ambulatory Visit: Payer: Self-pay | Admitting: Cardiology

## 2023-10-07 ENCOUNTER — Ambulatory Visit: Attending: Physician Assistant

## 2023-10-07 DIAGNOSIS — R55 Syncope and collapse: Secondary | ICD-10-CM

## 2023-10-12 DIAGNOSIS — R55 Syncope and collapse: Secondary | ICD-10-CM | POA: Diagnosis not present

## 2023-10-15 NOTE — Progress Notes (Signed)
 Pt has been made aware of normal result and verbalized understanding.  jw

## 2023-10-31 ENCOUNTER — Ambulatory Visit: Attending: Cardiology | Admitting: Cardiology

## 2023-11-27 ENCOUNTER — Ambulatory Visit: Attending: Cardiology | Admitting: Cardiology

## 2023-11-27 ENCOUNTER — Encounter: Payer: Self-pay | Admitting: Cardiology

## 2023-11-27 VITALS — BP 109/65 | HR 59 | Resp 16 | Ht 65.0 in | Wt 142.2 lb

## 2023-11-27 DIAGNOSIS — R55 Syncope and collapse: Secondary | ICD-10-CM

## 2023-11-27 DIAGNOSIS — I10 Essential (primary) hypertension: Secondary | ICD-10-CM | POA: Diagnosis present

## 2023-11-27 DIAGNOSIS — Z86711 Personal history of pulmonary embolism: Secondary | ICD-10-CM | POA: Diagnosis not present

## 2023-11-27 NOTE — Progress Notes (Signed)
 Cardiology Office Note:  .   Date:  11/27/2023  ID:  April Douglas, DOB 1933-11-26, MRN [id number] PCP:  Marvene Prentice SAUNDERS, FNP  Former Cardiology Providers: NA [facility] Providers Cardiologist:  Madonna Large, DO , [facility]  Electrophysiologist:  None  Click to update primary MD,subspecialty MD or APP then REFRESH:1}    Chief Complaint  Patient presents with   Follow-up    1 year History of syncope    History of Present Illness: .   April Douglas is a 89 y.o. African-American female whose past medical history and cardiovascular risk factors includes: Hypertension, hyperlipidemia, history of syncope (implanted in March 2021, stopped monitoring per patient request in April 2024), postmenopausal female, advanced age.   Patient being followed by the practice given her history of hypertension and history of syncope.  In March 2021 a loop recorder was implanted to evaluate for conduction disease and to evaluate if her syncope was cardiac in nature.  However in 2024 patient requested that the device be either explanted or turned off to minimize expense.  At her request the device was not explanted but made inactive.  She was last seen in the office in July 2024.  I am seeing her back as a 1 year follow-up visit.  Patient was last seen by Glendia Ferrier on 08/29/2023 after having a syncopal event on Aug 16, 2023.  EMS was called and EKG that was presented to Glendia Ferrier back in June 2025 was reported to be sinus bradycardia with a heart rate of 54 bpm underlying right bundle branch block.  30-day cardiac event monitor was ordered.  She presents today for follow-up.  Cardiac monitor noted an average heart rate of 66 bpm, no sustained arrhythmias, atrial fibrillation, or conduction disease during the monitoring period.  Patient is accompanied by her husband at bedside and her daughter Andrea was present over the phone who I spoke to as part of today's encounter.  Patient denies  anginal chest pain or heart failure symptoms.  Since last office visit in June 2025 she has not had any episodes of near syncope or syncopal event.  Overall functional capacity remains stable.   Review of Systems: .   Review of Systems  Cardiovascular:  Negative for chest pain, claudication, irregular heartbeat, leg swelling, near-syncope, orthopnea, palpitations, paroxysmal nocturnal dyspnea and syncope.  Respiratory:  Negative for shortness of breath.   Hematologic/Lymphatic: Negative for bleeding problem.    Studies Reviewed:   EKG: EKG Interpretation Date/Time:  Thursday November 27 2023 11:20:04 EDT Ventricular Rate:  62 PR Interval:  144 QRS Duration:  118 QT Interval:  424 QTC Calculation: 430 R Axis:   47  Text Interpretation: Normal sinus rhythm with sinus arrhythmia Right bundle branch block When compared with ECG of 06-Jan-2023 19:09, No significant change since last tracing Confirmed by Large Madonna ([id number]) on 11/27/2023 11:32:36 AM  Echocardiogram: October 2024:  1. Left ventricular ejection fraction, by estimation, is 65 to 70%. The  left ventricle has normal function. The left ventricle has no regional  wall motion abnormalities. Left ventricular diastolic parameters are  indeterminate.   2. Right ventricular systolic function is normal. The right ventricular  size is normal. There is normal pulmonary artery systolic pressure. The  estimated right ventricular systolic pressure is 26.4 mmHg.   3. The mitral valve is normal in structure. Trivial mitral valve  regurgitation. No evidence of mitral stenosis.   4. The aortic valve was not well visualized. Aortic  valve regurgitation  is trivial. No aortic stenosis is present.   5. The inferior vena cava is normal in size with greater than 50%  respiratory variability, suggesting right atrial pressure of 3 mmHg.    Stress Testing: Lexiscan  myoview  stress test 02/11/2018: Low risk study.  Cardiac monitor: [place]  30-day mobile cardiac telemetry September 06, 2023 -October 05, 2023 Dominant rhythm sinus.  Heart rate 42-139 bpm.  Avg HR 66 bpm. No atrial fibrillation detected during the monitoring period. No sustained supraventricular tachycardia, ventricular tachycardia, high grade AV block, pauses (3 seconds or longer). Total supraventricular ectopic burden  <1%. Total ventricular ectopic burden  <1%. 1 asymptomatic/auto triggered episode of NSVT, 9 beats, occurred September 18, 2023 12:08 AM. Patient triggered events: 0.    RADIOLOGY: NA  Risk Assessment/Calculations:   NA   Labs:       Latest Ref Rng & Units 08/29/2023    1:28 PM 03/09/2023    8:53 PM 01/06/2023    7:30 PM  CBC  WBC 3.4 - 10.8 x10E3/uL 4.8  4.7  5.0   Hemoglobin 11.1 - 15.9 g/dL 86.2  85.8  86.2   Hematocrit 34.0 - 46.6 % 42.2  42.5  40.9   Platelets 150 - 450 x10E3/uL 232  293  270        Latest Ref Rng & Units 08/29/2023    1:28 PM 03/09/2023    8:53 PM 01/06/2023    7:30 PM  BMP  Glucose 70 - 99 mg/dL 72  890  95   BUN 8 - 27 mg/dL 14  17  28    Creatinine 0.57 - 1.00 mg/dL 9.26  9.12  8.85   BUN/Creat Ratio 12 - 28 19     Sodium 134 - 144 mmol/L 137  133  139   Potassium 3.5 - 5.2 mmol/L 4.7  3.9  3.9   Chloride 96 - 106 mmol/L 98  98  103   CO2 20 - 29 mmol/L 23  24  25    Calcium 8.7 - 10.3 mg/dL 9.4  9.6  9.8       Latest Ref Rng & Units 08/29/2023    1:28 PM 03/09/2023    8:53 PM 01/06/2023    7:30 PM  CMP  Glucose 70 - 99 mg/dL 72  890  95   BUN 8 - 27 mg/dL 14  17  28    Creatinine 0.57 - 1.00 mg/dL 9.26  9.12  8.85   Sodium 134 - 144 mmol/L 137  133  139   Potassium 3.5 - 5.2 mmol/L 4.7  3.9  3.9   Chloride 96 - 106 mmol/L 98  98  103   CO2 20 - 29 mmol/L 23  24  25    Calcium 8.7 - 10.3 mg/dL 9.4  9.6  9.8     No results found for: CHOL, HDL, LDLCALC, LDLDIRECT, TRIG, CHOLHDL No results for input(s): LIPOA in the last 8760 hours. No components found for: NTPROBNP No results for  input(s): PROBNP in the last 8760 hours. Recent Labs    01/02/23 1149 03/10/23 1418  TSH 1.028 0.304*    Physical Exam:    Today's Vitals   11/27/23 1112  BP: 109/65  Pulse: (!) 59  Resp: 16  SpO2: 96%  Weight: 142 lb 3.2 oz (64.5 kg)  Height: 5' 5 (1.651 m)   Body mass index is 23.66 kg/m. Wt Readings from Last 3 Encounters:  11/27/23 142 lb 3.2 oz (64.5 kg)  03/09/23 142 lb (64.4 kg)  01/06/23 142 lb 6.7 oz (64.6 kg)    Physical Exam  Constitutional: No distress. She appears chronically ill.  hemodynamically stable, presents in wheelchair  Neck: No JVD present.  Cardiovascular: Normal rate, regular rhythm, S1 normal and S2 normal. Exam reveals no gallop, no S3 and no S4.  No murmur heard. Pulmonary/Chest: Effort normal and breath sounds normal. No stridor. She has no wheezes. She has no rales.  Musculoskeletal:        General: No edema.     Cervical back: Neck supple.  Skin: Skin is warm.   Impression & Recommendation(s):  Impression:   ICD-10-CM   1. Essential hypertension  I10 EKG 12-Lead    2. Syncope and collapse  R55     3. History of pulmonary embolism  Z86.711        Recommendation(s):  Essential hypertension Office blood pressures are soft. Her husband brought blood pressure readings from August 2025 and that SBP range between 132-151 mmHg. Currently on Benicar  20 mg p.o. every afternoon. Currently on amlodipine  5 mg p.o. every morning Recommended goal SBP around 130-140 mmHg. Recommended to continue to monitor ambulatory blood pressure readings, daughter states that the blood pressures are now better as she is in less pain after recent procedure.  Syncope and collapse No unifying diagnoses. Last episode May 2025. Cardiac monitor did not illustrate underlying A-fib, heart block, or conduction disease. Not on AV nodal blocking agents. Office blood pressures are soft, continue to monitor ambulatory readings and if they continue to be soft  would recommend holding amlodipine . Had a loop recorder implanted back in March 2021 but later deactivated due to monitoring fees.  History of pulmonary embolism Currently on anticoagulation. Managed by other providers in the care  Orders Placed:  Orders Placed This Encounter  Procedures   EKG 12-Lead    Final Medication List:   No orders of the defined types were placed in this encounter.   There are no discontinued medications.   Current Outpatient Medications:    acetaminophen  (TYLENOL ) 500 MG tablet, Take 1,000 mg by mouth 3 (three) times daily., Disp: , Rfl:    amLODipine  (NORVASC ) 5 MG tablet, Take 1 tablet (5 mg total) by mouth daily., Disp: 90 tablet, Rfl: 3   calcitonin, salmon, (MIACALCIN /FORTICAL) 200 UNIT/ACT nasal spray, Place 1 spray into alternate nostrils in the morning., Disp: , Rfl:    CALCIUM CARB-CHOLECALCIFEROL PO, Take 1 tablet by mouth daily., Disp: , Rfl:    carboxymethylcellulose (REFRESH PLUS) 0.5 % SOLN, Place 1 drop into both eyes 2 (two) times daily as needed (dry eyes)., Disp: , Rfl:    cetirizine (ZYRTEC) 10 MG tablet, Take 1 tablet by mouth daily as needed for allergies or rhinitis., Disp: , Rfl:    Cholecalciferol (VITAMIN D3) 50 MCG (2000 UT) CHEW, Chew 1 Dose by mouth daily as needed (per instructions)., Disp: , Rfl:    Cranberry-Vitamin C-Probiotic (AZO CRANBERRY PO), Take 1 tablet by mouth daily., Disp: , Rfl:    cycloSPORINE  (RESTASIS ) 0.05 % ophthalmic emulsion, 1 drop 2 (two) times daily., Disp: , Rfl:    donepezil  (ARICEPT ) 5 MG tablet, Take 5 mg by mouth at bedtime., Disp: , Rfl:    ELIQUIS  5 MG TABS tablet, Take 5 mg by mouth 2 (two) times daily., Disp: , Rfl:    estradiol (ESTRACE) 0.1 MG/GM vaginal cream, 1 Applicatorful once a week. Medication is 0.5/gm Takes two times per week, Disp: , Rfl:  fluticasone  (FLONASE ) 50 MCG/ACT nasal spray, Place 2 sprays into both nostrils at bedtime as needed for allergies or rhinitis (Takes at night when  taking Zyrtec)., Disp: , Rfl:    gabapentin (NEURONTIN) 300 MG capsule, Take 300 mg by mouth 3 (three) times daily., Disp: , Rfl:    hydrALAZINE  (APRESOLINE ) 100 MG tablet, Take 1 tablet (100 mg total) by mouth 2 (two) times daily. TAKE 1 TABLET(100 MG) BY MOUTH THREE TIMES DAILY, Disp: 270 tablet, Rfl: 1   memantine  (NAMENDA ) 10 MG tablet, Take 1 tablet by mouth 2 (two) times daily., Disp: , Rfl:    Menthol, Topical Analgesic, (BIOFREEZE ROLL-ON) 4 % GEL, Apply 1 Application topically 2 (two) times daily as needed (For arthritis pain)., Disp: , Rfl:    methocarbamol  (ROBAXIN ) 750 MG tablet, Take 750 mg by mouth 3 (three) times daily., Disp: , Rfl:    Multiple Vitamin (MULTIVITAMIN WITH MINERALS) TABS tablet, Take 1 tablet by mouth daily as needed (Takes when she remembers)., Disp: , Rfl:    olmesartan  (BENICAR ) 40 MG tablet, TAKE 1/2 TABLET(20 MG) BY MOUTH EVERY EVENING, Disp: , Rfl:    Omega-3 Fatty Acids (SALMON OIL-1000 PO), Take 1 capsule by mouth daily as needed (as instructed)., Disp: , Rfl:    pregabalin (LYRICA) 50 MG capsule, Take 50 mg by mouth 2 (two) times daily. (Patient taking differently: Take 100 mg by mouth 2 (two) times daily.), Disp: , Rfl:   Consent:   NA  Disposition:   1 year follow-up sooner if needed  Her questions and concerns were addressed to her satisfaction. She voices understanding of the recommendations provided during this encounter.    Signed, Madonna Michele HAS, [facility]  A Division of [facility] [address] [place], [place] [address]  11/27/2023 12:27 PM

## 2023-11-27 NOTE — Patient Instructions (Signed)
 Medication Instructions:  Your physician recommends that you continue on your current medications as directed. Please refer to the Current Medication list given to you today.  *If you need a refill on your cardiac medications before your next appointment, please call your pharmacy*  Lab Work: None ordered  Testing/Procedures: None ordered  Follow-Up: At [facility], you and your health needs are our priority.  As part of our continuing mission to provide you with exceptional heart care, our providers are all part of one team.  This team includes your primary Cardiologist (physician) and Advanced Practice Providers or APPs (Physician Assistants and Nurse Practitioners) who all work together to provide you with the care you need, when you need it.  Your next appointment:   1 year(s)  Provider:   Madonna Large, DO     Thank you for choosing [facility]!!   [phone]
